# Patient Record
Sex: Male | Born: 1945
Health system: Southern US, Community
[De-identification: ages and names within clinical notes are randomized; demographics above are authoritative.]

## PROBLEM LIST (undated history)

## (undated) DIAGNOSIS — E7800 Pure hypercholesterolemia, unspecified: Secondary | ICD-10-CM

## (undated) DIAGNOSIS — H269 Unspecified cataract: Secondary | ICD-10-CM

## (undated) DIAGNOSIS — I10 Essential (primary) hypertension: Secondary | ICD-10-CM

## (undated) DIAGNOSIS — E119 Type 2 diabetes mellitus without complications: Secondary | ICD-10-CM

## (undated) DIAGNOSIS — K5120 Ulcerative (chronic) proctitis without complications: Secondary | ICD-10-CM

## (undated) DIAGNOSIS — E1140 Type 2 diabetes mellitus with diabetic neuropathy, unspecified: Secondary | ICD-10-CM

## (undated) DIAGNOSIS — C4491 Basal cell carcinoma of skin, unspecified: Secondary | ICD-10-CM

## (undated) DIAGNOSIS — G629 Polyneuropathy, unspecified: Secondary | ICD-10-CM

## (undated) DIAGNOSIS — M1990 Unspecified osteoarthritis, unspecified site: Secondary | ICD-10-CM

## (undated) DIAGNOSIS — S86019A Strain of unspecified Achilles tendon, initial encounter: Secondary | ICD-10-CM

## (undated) DIAGNOSIS — E785 Hyperlipidemia, unspecified: Secondary | ICD-10-CM

## (undated) HISTORY — DX: Hyperlipidemia, unspecified: E78.5

## (undated) HISTORY — DX: Unspecified cataract: H26.9

## (undated) HISTORY — DX: Essential (primary) hypertension: I10

## (undated) HISTORY — DX: Basal cell carcinoma of skin, unspecified: C44.91

## (undated) HISTORY — DX: Strain of unspecified achilles tendon, initial encounter: S86.019A

## (undated) HISTORY — DX: Polyneuropathy, unspecified: G62.9

## (undated) HISTORY — DX: Type 2 diabetes mellitus without complications: E11.9

## (undated) HISTORY — DX: Pure hypercholesterolemia, unspecified: E78.00

## (undated) HISTORY — PX: OTHER SURGICAL HISTORY: SHX169

## (undated) HISTORY — DX: Ulcerative (chronic) proctitis without complications: K51.20

## (undated) HISTORY — PX: TONSILLECTOMY: SUR1361

---

## 2014-11-04 ENCOUNTER — Ambulatory Visit (INDEPENDENT_AMBULATORY_CARE_PROVIDER_SITE_OTHER): Payer: Medicare Other | Admitting: Diagnostic Neuroimaging

## 2014-11-04 ENCOUNTER — Encounter: Payer: Self-pay | Admitting: Diagnostic Neuroimaging

## 2014-11-04 VITALS — BP 139/76 | HR 68 | Ht 72.0 in | Wt 188.8 lb

## 2014-11-04 DIAGNOSIS — M5416 Radiculopathy, lumbar region: Secondary | ICD-10-CM | POA: Diagnosis not present

## 2014-11-04 DIAGNOSIS — E1142 Type 2 diabetes mellitus with diabetic polyneuropathy: Secondary | ICD-10-CM | POA: Diagnosis not present

## 2014-11-04 NOTE — Progress Notes (Signed)
[facility]  PATIENT: Shannon Hodge DOB: 1945-11-17  REFERRING CLINICIAN: Loraine Leriche HISTORY FROM: patient  REASON FOR VISIT: new consult    HISTORICAL  CHIEF COMPLAINT:  Chief Complaint  Patient presents with  . New Evaluation    diabetic neruopathy    HISTORY OF PRESENT ILLNESS:   69 year old right-handed male here for evaluation of low back pain, leg cramps, neuropathy. Patient reports some symptoms starting around age 74-41 years old, but more significant symptoms in the last 6-12 months.  In his 53s, patient developed low back pain, rating left side, left leg, with mild left foot drop. Patient was diagnosed with some degenerative lumbar spine disease, possible left L5 radiculopathy, treated conservatively. Patient had lingering symptoms throughout his life. Occasionally he would have cramping, pain in his left leg.  In 2000, patient diagnosed diabetes. Over past 1-2 years he has noticed some numbness and tingling in his toes and feet, mainly the bottom. He feels a thick, numb, dead sensation in his feet. No significant electrical, stinging, pins and needles pain in his feet. Patient started on gabapentin by PCP which has mildly helped his numbness symptoms.  Now patient having more problems with cramping in his left leg, hamstring region, calf, intermittently, mainly when he lays down or sits down for a long time. Moving, standing, stretching seems to help. He is fairly active at the gym several times per week, using upper body strength machines, weightlifting, treadmill walking.   REVIEW OF SYSTEMS: Full 14 system review of systems performed and notable only for cramps.  ALLERGIES: Allergies not on file  HOME MEDICATIONS: No outpatient prescriptions prior to visit.   No facility-administered medications prior to visit.    PAST MEDICAL HISTORY: Past Medical History  Diagnosis Date  . Diabetes   . Hyperlipidemia   . Hypertension   .  Hypercholesteremia     PAST SURGICAL HISTORY: No past surgical history on file.  FAMILY HISTORY: Family History  Problem Relation Age of Onset  . Pancreatic cancer Mother   . Stroke Father   . Brain cancer Father   . Diabetes Father     SOCIAL HISTORY:  History   Social History  . Marital Status: Married    Spouse Name: Diane   . Number of Children: 3  . Years of Education: post grad   Occupational History  . retired     Social History Main Topics  . Smoking status: Never Smoker   . Smokeless tobacco: Never Used  . Alcohol Use: 0.0 oz/week    0 Standard drinks or equivalent per week     Comment: occasionally   . Drug Use: No  . Sexual Activity: Not on file   Other Topics Concern  . Not on file   Social History Narrative   Lives with wife    Drinks 2 cups of coffee a day     PHYSICAL EXAM  Filed Vitals:   11/04/14 0957  BP: 139/76  Pulse: 68  Height: 6' (1.829 m)  Weight: 188 lb 12.8 oz (85.639 kg)    Body mass index is 25.6 kg/(m^2).   Visual Acuity Screening   Right eye Left eye Both eyes  Without correction: 20/70 20/30   With correction:       No flowsheet data found.  GENERAL EXAM: Patient is in no distress; well developed, nourished and groomed; neck is supple; STRAIGHT LEG RAISE NEG  CARDIOVASCULAR: Regular rate and rhythm, no murmurs, no carotid bruits  NEUROLOGIC:  MENTAL STATUS: awake, alert, oriented to person, place and time, recent and remote memory intact, normal attention and concentration, language fluent, comprehension intact, naming intact, fund of knowledge appropriate CRANIAL NERVE: no papilledema on fundoscopic exam, pupils equal and reactive to light, visual fields full to confrontation, extraocular muscles intact, no nystagmus, facial sensation and strength symmetric, hearing intact, palate elevates symmetrically, uvula midline, shoulder shrug symmetric, tongue midline. MOTOR: normal bulk and tone, full strength in the  BUE, BLE; EXCEPT SUBTLE WEAKNESS IN LEFT FOOT DORSIFLEXION SENSORY: normal and symmetric to light touch, pinprick, temperature, vibration and proprioception; EXCEPT DECR PP IN BOTH FEET, RIGHT TOE VIB 6 SEC, LEFT TOE VIB 3 SEC; DECR IN BOTH FEET (LEFT WORSE THAN RIGHT TO LT, PP AND TEMP) COORDINATION: finger-nose-finger --> MILD INTENTION TREMOR REFLEXES: BUE 1, KNEES 1, RIGHT ANKLE TRACE, LEFT ANKLE 0 GAIT/STATION: narrow based gait; able to walk on toes; LEFT FOOT DROP WEAKNESS ON HEEL WALKING; romberg is negative    DIAGNOSTIC DATA (LABS, IMAGING, TESTING) - I reviewed patient records, labs, notes, testing and imaging myself where available.  No results found for: WBC, HGB, HCT, MCV, PLT No results found for: NA, K, CL, CO2, GLUCOSE, BUN, CREATININE, CALCIUM, PROT, ALBUMIN, AST, ALT, ALKPHOS, BILITOT, GFRNONAA, GFRAA No results found for: CHOL, HDL, LDLCALC, LDLDIRECT, TRIG, CHOLHDL No results found for: HGBA1C No results found for: VITAMINB12 No results found for: TSH     ASSESSMENT AND PLAN  69 y.o. year old male here with 30+ years of low back pain, radiating to left leg, with intermittent left leg cramps / pain.   Dx: left lumbar radiculopathy (L5) + diabetic neuropathy + muscle cramps (due to neuropathy and radiculopathy)  PLAN:  Orders Placed This Encounter  Procedures  . Ambulatory referral to Physical Therapy   Return in about 3 months (around 02/03/2015).  I spent 45 minutes of face to face time with patient. Greater than 50% of time was spent in counseling and coordination of care with patient. We discussed physical therapy, fitness, stretching, conditioning, injury prevention, treatment options (conservative vs invasive). I suggested patient try conservative therapy with physical therapy management. If this fails we may increase gabapentin, consider MRI of the lumbar spine, and consider referral to pain management.   Penni Bombard, MD 03/05/6313, 97:02  AM Certified in Neurology, Neurophysiology and Neuroimaging  [facility] [address] [place], [place] [address] [phone]

## 2014-11-04 NOTE — Patient Instructions (Signed)
Increase gabapentin to 600 mg twice a day

## 2014-11-05 ENCOUNTER — Ambulatory Visit: Payer: Medicare Other | Attending: Diagnostic Neuroimaging

## 2014-11-05 DIAGNOSIS — I10 Essential (primary) hypertension: Secondary | ICD-10-CM | POA: Insufficient documentation

## 2014-11-05 DIAGNOSIS — E1142 Type 2 diabetes mellitus with diabetic polyneuropathy: Secondary | ICD-10-CM | POA: Diagnosis not present

## 2014-11-05 DIAGNOSIS — E785 Hyperlipidemia, unspecified: Secondary | ICD-10-CM | POA: Insufficient documentation

## 2014-11-05 DIAGNOSIS — M5416 Radiculopathy, lumbar region: Secondary | ICD-10-CM | POA: Diagnosis present

## 2014-11-05 DIAGNOSIS — M5442 Lumbago with sciatica, left side: Secondary | ICD-10-CM

## 2014-11-05 DIAGNOSIS — M25659 Stiffness of unspecified hip, not elsewhere classified: Secondary | ICD-10-CM

## 2014-11-05 NOTE — Therapy (Addendum)
[facility] [address], [place], [place], [address] Phone: [phone]   Fax:  [phone]  Physical Therapy Evaluation  Patient Details  Name: Shannon Hodge MRN: [id number] Date of Birth: 1945-09-19 Referring Provider:  Penni Bombard, MD  Encounter Date: 11/05/2014      PT End of Session - 11/05/14 5003    Visit Number 1   Number of Visits 10  medicare   Date for PT Re-Evaluation 12/31/14   PT Start Time 0844   PT Stop Time 0922   PT Time Calculation (min) 38 min   Activity Tolerance Patient tolerated treatment well   Behavior During Therapy [facility] for tasks assessed/performed      Past Medical History  Diagnosis Date  . Diabetes   . Hyperlipidemia   . Hypertension   . Hypercholesteremia     History reviewed. No pertinent past surgical history.  There were no vitals filed for this visit.  Visit Diagnosis:  Hip stiffness, unspecified laterality - Plan: PT plan of care cert/re-cert  Right-sided low back pain with left-sided sciatica - Plan: PT plan of care cert/re-cert      Subjective Assessment - 11/05/14 0846    Subjective Pt is a 69 y.o. male who presents to PT with complaints LBP of LE "cramping"  Lt>Rt especially at night when supine.  This is a chronic problem.   Pertinent History Pt reports Lt foot drop/weakness due to bulging disc 30 years ago.   Diagnostic tests no recent   Patient Stated Goals Reduce leg cramping especially at night, sleep without interruption   Currently in Pain? Yes   Pain Score 5   first thing in the morning   Pain Location Back   Pain Orientation Right;Left   Pain Descriptors / Indicators Sore   Pain Radiating Towards both legs at night- cramping.  Intense cramping Lt>Rt LE   Pain Onset More than a month ago   Pain Frequency Intermittent   Aggravating Factors  sitting/standing long periods (low back pain), sleeping- legs   Pain Relieving Factors avoiding supine,  LBP reduces as day progresses (worse in the morning).  Cramping is relieved by lumbar sidebending   Multiple Pain Sites No            [facility] PT Assessment - 11/05/14 0001    Assessment   Medical Diagnosis Lumbar radiculopathy (M54.16), diabetic polyneuropathy associated with DM (E11.42)   Onset Date 08/01/94   Next MD Visit 01/2015   Prior Therapy none   Precautions   Precautions None   Restrictions   Weight Bearing Restrictions No   Balance Screen   Has the patient fallen in the past 6 months No   Has the patient had a decrease in activity level because of a fear of falling?  No   Is the patient reluctant to leave their home because of a fear of falling?  No   Home Environment   Living Enviornment Private residence   Prior Function   Level of Independence Independent with basic ADLs   Vocation Retired   Biomedical scientist NA   Leisure Pt exercises at [place] 2-3x/wk-weights for upper body and core stabilization   Cognition   Overall Cognitive Status Within Functional Limits for tasks assessed   Observation/Other Assessments   Focus on Therapeutic Outcomes (FOTO)  34% limitation   Posture/Postural Control   Posture/Postural Control Postural limitations   Postural Limitations Decreased lumbar lordosis   ROM / Strength  AROM / PROM / Strength AROM;PROM;Strength   AROM   Overall AROM  Deficits   Overall AROM Comments Lumbar AROM is limited by 25% in all directions with reduced segmental mobility wiht all motions.     AROM Assessment Site Lumbar   PROM   Overall PROM  Deficits   Overall PROM Comments Hip flexibility is limited by 25-50% limitation in all directions   PROM Assessment Site Hip   Strength   Overall Strength Deficits   Overall Strength Comments Lt LE 4+/5, Rt LE 5/5   Strength Assessment Site Hip;Knee   Palpation   Palpation Pt with PA mobility limited by 75% in the lumbar and thoracic spine.  Tension and tenderness noted in bilateral lumbar  parapinals and quadratus   Ambulation/Gait   Ambulation/Gait Yes   Ambulation/Gait Assistance 7: Independent                   [facility] Adult PT Treatment/Exercise - 11/05/14 0001    Exercises   Exercises Lumbar;Knee/Hip   Lumbar Exercises: Stretches   Active Hamstring Stretch 3 reps;20 seconds   Single Knee to Chest Stretch 3 reps;20 seconds   Double Knee to Chest Stretch 3 reps;20 seconds                PT Education - 11/05/14 0917    Education provided Yes   Education Details HEP: lumbar flexibility, seated hamstring stretch   Person(s) Educated Patient   Methods Explanation;Handout;Demonstration   Comprehension Verbalized understanding;Returned demonstration          PT Short Term Goals - 11/05/14 0925    PT SHORT TERM GOAL #1   Title be independent in initial HEP   Time 4   Period Weeks   Status New   PT SHORT TERM GOAL #2   Title report a 25% reduction in leg cramping with sleep at night   Time 4   Period Weeks   Status New   PT SHORT TERM GOAL #3   Title report a 25% reduction in lumbar stiffness in the morning hours   Time 4   Period Weeks   Status New           PT Long Term Goals - 11/05/14 2423    PT LONG TERM GOAL #1   Title be independent in advanced HEP and gym program   Time 8   Period Weeks   Status New   PT LONG TERM GOAL #2   Title reduce FOTO to < or = to 32% limitation   Time 8   Period Weeks   Status New   PT LONG TERM GOAL #3   Title report a 60% reduction in lumbar stiffness in the morning hours   Time 8   Period Weeks   Status New   PT LONG TERM GOAL #4   Title report a 50% reduction in LE cramping at night with sleep   Time 8   Period Weeks   Status New               Plan - 11/05/14 5361    Clinical Impression Statement Pt presents to PT with chronic lumbar pain and LE cramping at night.  Pt with limited lumbar and hip flexibility throughout.  Pt would benefit from a program for flexibility and  core stability.     Pt will benefit from skilled therapeutic intervention in order to improve on the following deficits Impaired flexibility;Improper body mechanics;Pain;Decreased strength  Rehab Potential Good   PT Frequency 2x / week   PT Duration 8 weeks   PT Treatment/Interventions ADLs/Self Care Home Management;Moist Heat;Therapeutic activities;Patient/family education;Therapeutic exercise;Passive range of motion;Ultrasound;Manual techniques;Neuromuscular re-education;Electrical Stimulation;Traction   PT Next Visit Plan Body mechanics education.  Discuss gym exercises for LE strength and endurance.  Hip and lumbar flexibility, core stability   Consulted and Agree with Plan of Care Patient          G-Codes - Nov 30, 2014 0842    Functional Assessment Tool Used FOTO: 34% limitation   Functional Limitation Other PT primary   Other PT Primary Current Status (X7741) At least 20 percent but less than 40 percent impaired, limited or restricted   Other PT Primary Goal Status (S2395) At least 20 percent but less than 40 percent impaired, limited or restricted       Problem List Patient Active Problem List   Diagnosis Date Noted  . Lumbar radiculopathy 11/04/2014  . Diabetic polyneuropathy associated with type 2 diabetes mellitus 11/04/2014    Rossi Silvestro, PT Nov 30, 2014, 9:34 AM  [facility] [address] [place], [place], [address] Phone: [phone]   Fax:  [phone]

## 2014-11-05 NOTE — Patient Instructions (Signed)
Perform all exercises below:  Hold _20___ seconds. Repeat _3___ times.  Do __3__ sessions per day. CAUTION: Movement should be gentle, steady and slow.  Knee to Chest  Lying supine, bend involved knee to chest. Perform with each leg.  Copyright  VHI. All rights reserved.  Double Knee to Chest (Flexion)   Gently pull both knees toward chest. Feel stretch in lower back or buttock area. Breathing deeply, Lumbar Rotation: Caudal - Bilateral (Supine)  Feet and knees together, arms outstretched, rotate knees left, turning head in opposite direction, until stretch is felt.      HIP: Hamstrings - Short Sitting   Rest leg on raised surface. Keep knee straight. Lift chest. Hold _20__ seconds. 3___ reps per set, _3__ sets per day, _7__ days per week  Copyright  VHI. All rights reserved.

## 2014-11-07 ENCOUNTER — Encounter: Payer: Self-pay | Admitting: Physical Therapy

## 2014-11-07 ENCOUNTER — Ambulatory Visit: Payer: Medicare Other | Admitting: Physical Therapy

## 2014-11-07 DIAGNOSIS — M25659 Stiffness of unspecified hip, not elsewhere classified: Secondary | ICD-10-CM

## 2014-11-07 DIAGNOSIS — M5442 Lumbago with sciatica, left side: Secondary | ICD-10-CM

## 2014-11-07 DIAGNOSIS — M5416 Radiculopathy, lumbar region: Secondary | ICD-10-CM | POA: Diagnosis not present

## 2014-11-07 NOTE — Therapy (Signed)
[facility] [address] [place], [place], [address] Phone: [phone]   Fax:  [phone]  Physical Therapy Treatment  Patient Details  Name: Shannon Hodge MRN: [id number] Date of Birth: [birth year] Referring Provider:  Stephens Shire, MD  Encounter Date: 11/07/2014      PT End of Session - 11/07/14 0941    Visit Number 2   Number of Visits 10   Date for PT Re-Evaluation 12/31/14   PT Start Time 0850   PT Stop Time 0955   PT Time Calculation (min) 65 min   Activity Tolerance Patient tolerated treatment well   Behavior During Therapy [facility] for tasks assessed/performed      Past Medical History  Diagnosis Date  . Diabetes   . Hyperlipidemia   . Hypertension   . Hypercholesteremia     History reviewed. No pertinent past surgical history.  There were no vitals filed for this visit.  Visit Diagnosis:  Hip stiffness, unspecified laterality  Right-sided low back pain with left-sided sciatica      Subjective Assessment - 11/07/14 0850    Subjective Stretches helping with morning stiffness.   Currently in Pain? Yes   Pain Score 3    Pain Location Back   Pain Orientation Right;Left   Pain Descriptors / Indicators Sore   Multiple Pain Sites No                       [facility] Adult PT Treatment/Exercise - 11/07/14 0001    Lumbar Exercises: Aerobic   Stationary Bike L2 x 8 min as pt described his current exercise regimine.                PT Education - 11/07/14 0934    Education provided Yes   Education Details HEP for core: isolations and with leg movements. ADL posture and body mechanics.   Person(s) Educated Patient   Methods Explanation;Demonstration;Tactile cues;Verbal cues;Handout   Comprehension Verbalized understanding;Returned demonstration;Tactile cues required          PT Short Term Goals - 11/05/14 0925    PT SHORT TERM GOAL #1   Title be independent in initial HEP    Time 4   Period Weeks   Status New   PT SHORT TERM GOAL #2   Title report a 25% reduction in leg cramping with sleep at night   Time 4   Period Weeks   Status New   PT SHORT TERM GOAL #3   Title report a 25% reduction in lumbar stiffness in the morning hours   Time 4   Period Weeks   Status New           PT Long Term Goals - 11/05/14 5320    PT LONG TERM GOAL #1   Title be independent in advanced HEP and gym program   Time 8   Period Weeks   Status New   PT LONG TERM GOAL #2   Title reduce FOTO to < or = to 32% limitation   Time 8   Period Weeks   Status New   PT LONG TERM GOAL #3   Title report a 60% reduction in lumbar stiffness in the morning hours   Time 8   Period Weeks   Status New   PT LONG TERM GOAL #4   Title report a 50% reduction in LE cramping at night with sleep   Time 8   Period Weeks  Status New               Plan - 11/07/14 0955    Clinical Impression Statement Patient educated in core and body mechanics.   Pt will benefit from skilled therapeutic intervention in order to improve on the following deficits Impaired flexibility;Improper body mechanics;Pain;Decreased strength   Rehab Potential Good   PT Frequency 2x / week   PT Duration 8 weeks   PT Treatment/Interventions ADLs/Self Care Home Management;Moist Heat;Therapeutic activities;Patient/family education;Therapeutic exercise;Passive range of motion;Ultrasound;Manual techniques;Neuromuscular re-education;Electrical Stimulation;Traction   PT Next Visit Plan Review core contraction and stretches, add quadruped stabilization to program.   Consulted and Agree with Plan of Care Patient        Problem List Patient Active Problem List   Diagnosis Date Noted  . Lumbar radiculopathy 11/04/2014  . Diabetic polyneuropathy associated with type 2 diabetes mellitus 11/04/2014    [name], PTA 11/07/2014, 10:03 AM  [facility] [address] [place], [place], [address] Phone: [phone]   Fax:  [phone]

## 2014-11-07 NOTE — Patient Instructions (Signed)
Lower abdominal/core stability exercises  1. Practice your breathing technique: Inhale through your nose expanding your belly and rib cage. Try not to breathe into your chest. Exhale slowly and gradually out your mouth feeling a sense of softness to your body. Practice multiple times. This can be performed unlimited.  2. Finding the lower abdominals. Laying on your back with the knees bent, place your fingers just below your belly button. Using your breathing technique from above, on your exhale gently pull the belly button away from your fingertips without tensing any other muscles. Practice this 5x. Next, as you exhale, draw belly button inwards and hold onto it...then feel as if you are pulling that muscle across your pelvis like you are tightening a belt. This can be hard to do at first so be patient and practice. Do 5-10 reps 1-3 x day. Always recognize quality over quantity; if your abdominal muscles become tired you will notice you may tighten/contract other muscles. This is the time to take a break.   Practice this first laying on your back, then in sitting, progressing to standing and finally adding it to all your daily movements.   3. Finding your pelvic floor. Using the breathing technique above, when your exhale, this time draw your pelvic floor muscles up as if you were attempting to stop the flow of urination. Be careful NOT to tense any other muscles. This can be hard, BE PATIENT. Try to hold up to 10 seconds repeating 10x. Try 2x a day. Once you feel you are doing this well, add this contraction to exercise #2. First contracting your pelvic floor followed by lower abdominals.  4. Adding leg movements. Add the following leg movements to challenge your ability to keep your core stable:  1. Single leg drop outs: Laying on your back with knees bent feet flat. Inhale,  dropping one knee outward KEEPING YOUR PELVIS STILL. Exhale as you bring the leg back, simultaneously performing your lower  abdominal contraction. Do 5-10 on each leg.  2. Marching: While keeping your pelvis still, lift the right foot a few inches, put it down then lift left foot. This will mimic a march. Start slow to establish control. Once you have control you may speed it up. Do 10-20x. You MUST keep your lower abdominlas contracted while you march. Breathe naturally   3. Single leg slides: Inhale while you slowly slide one leg out keeping your pelvis still. Only slide your leg as far as you can keep your pelvis still. Exhale as you bring the leg back to the start, contracting the lower abdominals as you do that. Keep your upper body relaxed. Do 5-10 on each side. Sleeping on Back  Place pillow under knees. A pillow with cervical support and a roll around waist are also helpful. Copyright  VHI. All rights reserved.  Sleeping on Side Place pillow between knees. Use cervical support under neck and a roll around waist as needed. Copyright  VHI. All rights reserved.   Sleeping on Stomach   If this is the only desirable sleeping position, place pillow under lower legs, and under stomach or chest as needed.  Posture - Sitting   Sit upright, head facing forward. Try using a roll to support lower back. Keep shoulders relaxed, and avoid rounded back. Keep hips level with knees. Avoid crossing legs for long periods. Stand to Sit / Sit to Stand   To sit: Bend knees to lower self onto front edge of chair, then scoot back on  seat. To stand: Reverse sequence by placing one foot forward, and scoot to front of seat. Use rocking motion to stand up.   Work Height and Reach  Ideal work height is no more than 2 to 4 inches below elbow level when standing, and at elbow level when sitting. Reaching should be limited to arm's length, with elbows slightly bent.  Bending  Bend at hips and knees, not back. Keep feet shoulder-width apart.    Posture - Standing   Good posture is important. Avoid slouching and forward head  thrust. Maintain curve in low back and align ears over shoul- ders, hips over ankles.  Alternating Positions   Alternate tasks and change positions frequently to reduce fatigue and muscle tension. Take rest breaks. Computer Work   Position work to Programmer, multimedia. Use proper work and seat height. Keep shoulders back and down, wrists straight, and elbows at right angles. Use chair that provides full back support. Add footrest and lumbar roll as needed.  Getting Into / Out of Car  Lower self onto seat, scoot back, then bring in one leg at a time. Reverse sequence to get out.  Dressing  Lie on back to pull socks or slacks over feet, or sit and bend leg while keeping back straight.    Housework - Sink  Place one foot on ledge of cabinet under sink when standing at sink for prolonged periods.   Pushing / Pulling  Pushing is preferable to pulling. Keep back in proper alignment, and use leg muscles to do the work.  Deep Squat   Squat and lift with both arms held against upper trunk. Tighten stomach muscles without holding breath. Use smooth movements to avoid jerking.  Avoid Twisting   Avoid twisting or bending back. Pivot around using foot movements, and bend at knees if needed when reaching for articles.  Carrying Luggage   Distribute weight evenly on both sides. Use a cart whenever possible. Do not twist trunk. Move body as a unit.   Lifting Principles .Maintain proper posture and head alignment. .Slide object as close as possible before lifting. .Move obstacles out of the way. .Test before lifting; ask for help if too heavy. .Tighten stomach muscles without holding breath. .Use smooth movements; do not jerk. .Use legs to do the work, and pivot with feet. .Distribute the work load symmetrically and close to the center of trunk. .Push instead of pull whenever possible.   Ask For Help   Ask for help and delegate to others when possible. Coordinate your movements when  lifting together, and maintain the low back curve.  Log Roll   Lying on back, bend left knee and place left arm across chest. Roll all in one movement to the right. Reverse to roll to the left. Always move as one unit. Housework - Sweeping  Use long-handled equipment to avoid stooping.   Housework - Wiping  Position yourself as close as possible to reach work surface. Avoid straining your back.  Laundry - Unloading Wash   To unload small items at bottom of washer, lift leg opposite to arm being used to reach.  [place] close to area to be raked. Use arm movements to do the work. Keep back straight and avoid twisting.     Cart  When reaching into cart with one arm, lift opposite leg to keep back straight.   Getting Into / Out of Bed  Lower self to lie down on one side by raising legs  and lowering head at the same time. Use arms to assist moving without twisting. Bend both knees to roll onto back if desired. To sit up, start from lying on side, and use same move-ments in reverse. Housework - Vacuuming  Hold the vacuum with arm held at side. Step back and forth to move it, keeping head up. Avoid twisting.   Laundry - IT consultant so that bending and twisting can be avoided.   Laundry - Unloading Dryer  Squat down to reach into clothes dryer or use a reacher.  Gardening - Weeding / Probation officer or Kneel. Knee pads may be helpful.

## 2014-11-10 ENCOUNTER — Ambulatory Visit: Payer: Self-pay | Admitting: Physical Therapy

## 2014-11-12 ENCOUNTER — Ambulatory Visit: Payer: Medicare Other

## 2014-11-12 DIAGNOSIS — M5442 Lumbago with sciatica, left side: Secondary | ICD-10-CM

## 2014-11-12 DIAGNOSIS — M25659 Stiffness of unspecified hip, not elsewhere classified: Secondary | ICD-10-CM

## 2014-11-12 DIAGNOSIS — M5416 Radiculopathy, lumbar region: Secondary | ICD-10-CM | POA: Diagnosis not present

## 2014-11-12 NOTE — Therapy (Signed)
[facility] [address] [place], [place], [address] Phone: [phone]   Fax:  [phone]  Physical Therapy Treatment  Patient Details  Name: Shannon Hodge MRN: [id number] Date of Birth: 1945/11/05 Referring Provider:  Penni Bombard, MD  Encounter Date: 11/12/2014      PT End of Session - 11/12/14 0923    Visit Number 3   Number of Visits 10  Medicare   Date for PT Re-Evaluation 12/31/14   PT Start Time 0850   PT Stop Time 0930   PT Time Calculation (min) 40 min   Activity Tolerance Patient tolerated treatment well   Behavior During Therapy [facility] for tasks assessed/performed      Past Medical History  Diagnosis Date  . Diabetes   . Hyperlipidemia   . Hypertension   . Hypercholesteremia     History reviewed. No pertinent past surgical history.  There were no vitals filed for this visit.  Visit Diagnosis:  Hip stiffness, unspecified laterality  Right-sided low back pain with left-sided sciatica      Subjective Assessment - 11/12/14 0854    Subjective Pt has been doing exercises and working out at the gym.   Currently in Pain? Yes   Pain Score 3    Pain Location Back   Pain Orientation Right;Left   Pain Descriptors / Indicators Sore   Pain Type Chronic pain   Pain Onset More than a month ago   Pain Frequency Intermittent   Aggravating Factors  sleeping- legs, sitting and standing long periods   Pain Relieving Factors not laying down                        [facility] Adult PT Treatment/Exercise - 11/12/14 0001    Lumbar Exercises: Stretches   Active Hamstring Stretch 3 reps;20 seconds  performed in standing on step   Single Knee to Chest Stretch 3 reps;20 seconds   Double Knee to Chest Stretch 3 reps;20 seconds   Lumbar Exercises: Aerobic   Stationary Bike L2 x 8 min as pt described his current exercise regimine.  PT present to discuss progress   Lumbar Exercises: Supine   Ab Set Other (comment)  review of core stabilization exercises issued last session   AB Set Limitations added leg motions to HEP- max verbal cueing required                  PT Short Term Goals - 11/12/14 0855    PT SHORT TERM GOAL #1   Title be independent in initial HEP   Time 4   Period Weeks   Status On-going  Pt is independent in current HEP   PT SHORT TERM GOAL #2   Title report a 25% reduction in leg cramping with sleep at night   Time 4   Period Weeks   Status On-going  Pt denies any cramps in the past few days   PT SHORT TERM GOAL #3   Title report a 25% reduction in lumbar stiffness in the morning hours   Time 4   Period Weeks   Status On-going  Pt reports less stiffness in the morning but not able to rate           PT Long Term Goals - 11/05/14 6599    PT LONG TERM GOAL #1   Title be independent in advanced HEP and gym program   Time 8   Period Weeks  Status New   PT LONG TERM GOAL #2   Title reduce FOTO to < or = to 32% limitation   Time 8   Period Weeks   Status New   PT LONG TERM GOAL #3   Title report a 60% reduction in lumbar stiffness in the morning hours   Time 8   Period Weeks   Status New   PT LONG TERM GOAL #4   Title report a 50% reduction in LE cramping at night with sleep   Time 8   Period Weeks   Status New               Plan - 11/12/14 0906    Clinical Impression Statement Pt with compliance with HEP.  See STGs for status.  Pt requires max verbal cues to not bounce with stretching and to not force stretches.   Pt will benefit from skilled therapeutic intervention in order to improve on the following deficits Impaired flexibility;Improper body mechanics;Pain;Decreased strength   Rehab Potential Good   PT Frequency 2x / week   PT Duration 8 weeks   PT Treatment/Interventions ADLs/Self Care Home Management;Moist Heat;Therapeutic activities;Patient/family education;Therapeutic exercise;Passive range of  motion;Ultrasound;Manual techniques;Neuromuscular re-education;Electrical Stimulation;Traction   PT Next Visit Plan Continue core stabilization, add quadruped exercises to HEP   Consulted and Agree with Plan of Care Patient        Problem List Patient Active Problem List   Diagnosis Date Noted  . Lumbar radiculopathy 11/04/2014  . Diabetic polyneuropathy associated with type 2 diabetes mellitus 11/04/2014    Satrina Magallanes, PT 11/12/2014, 9:30 AM  [facility] [address] [place], [place], [address] Phone: [phone]   Fax:  [phone]

## 2014-11-14 ENCOUNTER — Ambulatory Visit: Payer: Medicare Other | Admitting: Physical Therapy

## 2014-11-14 ENCOUNTER — Encounter: Payer: Self-pay | Admitting: Physical Therapy

## 2014-11-14 DIAGNOSIS — M5416 Radiculopathy, lumbar region: Secondary | ICD-10-CM | POA: Diagnosis not present

## 2014-11-14 DIAGNOSIS — M5442 Lumbago with sciatica, left side: Secondary | ICD-10-CM

## 2014-11-14 NOTE — Patient Instructions (Signed)
Isometric Hold (Quadruped)   On hands and knees, slowly inhale, and then exhale. Pull navel toward spine and Hold for _5__ seconds. Continue to breathe in and out during hold. Rest for 3___ seconds. Repeat __6_ times. Do __1-2_ times a day.   Copyright  VHI. All rights reserved.  Bracing With Arm Raise (Quadruped)   On hands and knees find neutral spine. Tighten pelvic floor and abdominals and hold. Alternately lift arm to shoulder level. Repeat _3__ times. Do _1-3__ times a day.   Copyright  VHI. All rights reserved.  Bracing With Leg Raise (Quadruped)   On hands and knees find neutral spine. Tighten pelvic floor and abdominals and hold. Alternating legs, straighten and lift to hip level. Repeat _3__ times. Do1-3 ___ times a day.   Copyright  VHI. All rights reserved.  Bracing With Arm / Leg Raise (Quadruped)   On hands and knees find neutral spine. Tighten pelvic floor and abdominals and hold. Alternating, lift arm to shoulder level and opposite leg to hip level. Repeat 3___ times. Do _1-3__ times a day.   Copyright  VHI. All rights reserved.  Abduction Lift   Lie on right  side. Tighten muscles on outside of hip to left leg _6-12___ inches. Hold _1___ seconds. Repeat _10___ times. Do __1-3__ sessions per day. Make sure abs are lifting up and in, leg is straight and you are not rolled back in your pelvis. Keep hips stacked.  Copyright  VHI. All rights reserved.

## 2014-11-14 NOTE — Therapy (Signed)
[facility] [address] [place], [place], [address] Phone: [phone]   Fax:  [phone]  Physical Therapy Treatment  Patient Details  Name: Shannon Hodge MRN: [id number] Date of Birth: 1945-10-31 Referring Provider:  Penni Bombard, MD  Encounter Date: 11/14/2014      PT End of Session - 11/14/14 0934    Visit Number 4   Number of Visits 10   Date for PT Re-Evaluation 12/31/14   PT Start Time 1761   PT Stop Time 0934   PT Time Calculation (min) 47 min   Activity Tolerance Patient tolerated treatment well   Behavior During Therapy [facility] for tasks assessed/performed      Past Medical History  Diagnosis Date  . Diabetes   . Hyperlipidemia   . Hypertension   . Hypercholesteremia     History reviewed. No pertinent past surgical history.  There were no vitals filed for this visit.  Visit Diagnosis:  Right-sided low back pain with left-sided sciatica      Subjective Assessment - 11/14/14 0851    Subjective Too soon to tell any major differences. Doing all exercises.    Currently in Pain? No/denies  Morning stiffness                       [facility] Adult PT Treatment/Exercise - 11/14/14 0001    Lumbar Exercises: Aerobic   Stationary Bike L2x 10 min   UBE (Upper Arm Bike) Sitting on ball L2 3x3   Lumbar Exercises: Supine   Ab Set 5 reps   AB Set Limitations MIn VC to start i neutral spine   Lumbar Exercises: Sidelying   Hip Abduction 10 reps   Hip Abduction Limitations Tactile cuing for pelvic position   Lumbar Exercises: Quadruped   Single Arm Raise Right;Left;1 second   Single Arm Raises Limitations 3x   Straight Leg Raise 2 seconds   Straight Leg Raises Limitations 3x bil   Opposite Arm/Leg Raise Limitations 2x  bil                PT Education - 11/14/14 0920    Education provided Yes   Education Details HEP advancement: quadruped series and sidelying abduction   Person(s) Educated Patient   Methods Explanation;Demonstration;Tactile cues;Verbal cues;Handout   Comprehension Verbalized understanding;Returned demonstration          PT Short Term Goals - 11/12/14 0855    PT SHORT TERM GOAL #1   Title be independent in initial HEP   Time 4   Period Weeks   Status On-going  Pt is independent in current HEP   PT SHORT TERM GOAL #2   Title report a 25% reduction in leg cramping with sleep at night   Time 4   Period Weeks   Status On-going  Pt denies any cramps in the past few days   PT SHORT TERM GOAL #3   Title report a 25% reduction in lumbar stiffness in the morning hours   Time 4   Period Weeks   Status On-going  Pt reports less stiffness in the morning but not able to rate           PT Long Term Goals - 11/05/14 0842    PT LONG TERM GOAL #1   Title be independent in advanced HEP and gym program   Time 8   Period Weeks   Status New   PT LONG TERM GOAL #2  Title reduce FOTO to < or = to 32% limitation   Time 8   Period Weeks   Status New   PT LONG TERM GOAL #3   Title report a 60% reduction in lumbar stiffness in the morning hours   Time 8   Period Weeks   Status New   PT LONG TERM GOAL #4   Title report a 50% reduction in LE cramping at night with sleep   Time 8   Period Weeks   Status New               Plan - 11/14/14 0934    Clinical Impression Statement Advanced HEP to include quadruped exercises.    Pt will benefit from skilled therapeutic intervention in order to improve on the following deficits Impaired flexibility;Improper body mechanics;Pain;Decreased strength   Rehab Potential Good   PT Frequency 2x / week   PT Duration 8 weeks   PT Treatment/Interventions ADLs/Self Care Home Management;Moist Heat;Therapeutic activities;Patient/family education;Therapeutic exercise;Passive range of motion;Ultrasound;Manual techniques;Neuromuscular re-education;Electrical Stimulation;Traction   PT Next Visit Plan  Review exercises   Consulted and Agree with Plan of Care Patient        Problem List Patient Active Problem List   Diagnosis Date Noted  . Lumbar radiculopathy 11/04/2014  . Diabetic polyneuropathy associated with type 2 diabetes mellitus 11/04/2014    Machi Whittaker, PTA 11/14/2014, 9:36 AM  [facility] [address], [place], [place], [address] Phone: [phone]   Fax:  [phone]

## 2014-11-19 ENCOUNTER — Ambulatory Visit: Payer: Medicare Other | Admitting: Physical Therapy

## 2014-11-19 ENCOUNTER — Encounter: Payer: Self-pay | Admitting: Physical Therapy

## 2014-11-19 DIAGNOSIS — M5416 Radiculopathy, lumbar region: Secondary | ICD-10-CM | POA: Diagnosis not present

## 2014-11-19 DIAGNOSIS — M25659 Stiffness of unspecified hip, not elsewhere classified: Secondary | ICD-10-CM

## 2014-11-19 DIAGNOSIS — M5442 Lumbago with sciatica, left side: Secondary | ICD-10-CM

## 2014-11-19 NOTE — Patient Instructions (Signed)
Bridge   Lie back, legs bent. Inhale, squeeze ball between knees then lift hips up. Keeping ribs in, lengthen lower back. Exhale, rolling down along spine from top. Repeat _10___ times. Do _1-2___ sessions per day. Pause at the top to engage your core muscles.  Copyright  VHI. All rights reserved.

## 2014-11-19 NOTE — Therapy (Signed)
[facility] [address], [place], [place], [address] Phone: [phone]   Fax:  [phone]  Physical Therapy Treatment  Patient Details  Name: Shannon Hodge MRN: [id number] Date of Birth: 12-02-1945 Referring Provider:  Stephens Shire, MD  Encounter Date: 11/19/2014      PT End of Session - 11/19/14 0918    Visit Number 5   Number of Visits 10   Date for PT Re-Evaluation 12/31/14   PT Start Time 0845   PT Stop Time 0925   PT Time Calculation (min) 40 min   Activity Tolerance Patient tolerated treatment well   Behavior During Therapy [facility] for tasks assessed/performed      Past Medical History  Diagnosis Date  . Diabetes   . Hyperlipidemia   . Hypertension   . Hypercholesteremia     History reviewed. No pertinent past surgical history.  There were no vitals filed for this visit.  Visit Diagnosis:  Right-sided low back pain with left-sided sciatica  Hip stiffness, unspecified laterality      Subjective Assessment - 11/19/14 0845    Subjective I feel residually better after my workouts.    Currently in Pain? No/denies                         [facility] Adult PT Treatment/Exercise - 11/19/14 0001    Lumbar Exercises: Aerobic   Stationary Bike L3 x10 min   UBE (Upper Arm Bike) Sitting on ball L2 4x4   Lumbar Exercises: Standing   Heel Raises 10 reps   Lumbar Exercises: Supine   Bridge 10 reps;2 seconds   Bridge Limitations Used ball   Lumbar Exercises: Sidelying   Hip Abduction 20 reps   Lumbar Exercises: Quadruped   Opposite Arm/Leg Raise Right arm/Left leg;Left arm/Right leg;5 reps;2 seconds                PT Education - 11/19/14 0900    Education provided Yes   Education Details Bridge   Person(s) Educated Patient   Methods Explanation;Demonstration;Tactile cues;Verbal cues;Handout   Comprehension Verbalized understanding;Returned demonstration;Verbal cues  required;Tactile cues required          PT Short Term Goals - 11/19/14 0850    PT SHORT TERM GOAL #1   Title be independent in initial HEP   Time 4   Period Weeks   Status Achieved   PT SHORT TERM GOAL #2   Title report a 25% reduction in leg cramping with sleep at night   Time 4   Period Weeks   Status Achieved  25%   PT SHORT TERM GOAL #3   Title report a 25% reduction in lumbar stiffness in the morning hours   Time 4   Period Weeks   Status --  25%           PT Long Term Goals - 11/05/14 8099    PT LONG TERM GOAL #1   Title be independent in advanced HEP and gym program   Time 8   Period Weeks   Status New   PT LONG TERM GOAL #2   Title reduce FOTO to < or = to 32% limitation   Time 8   Period Weeks   Status New   PT LONG TERM GOAL #3   Title report a 60% reduction in lumbar stiffness in the morning hours   Time 8   Period Weeks   Status New  PT LONG TERM GOAL #4   Title report a 50% reduction in LE cramping at night with sleep   Time 8   Period Weeks   Status New               Plan - 11/19/14 8288    Clinical Impression Statement Patient compliant with HEP, added bridge to advance HEP.    Pt will benefit from skilled therapeutic intervention in order to improve on the following deficits Impaired flexibility;Improper body mechanics;Pain;Decreased strength   Rehab Potential Good   PT Frequency 2x / week   PT Duration 8 weeks   PT Treatment/Interventions ADLs/Self Care Home Management;Moist Heat;Therapeutic activities;Patient/family education;Therapeutic exercise;Passive range of motion;Ultrasound;Manual techniques;Neuromuscular re-education;Electrical Stimulation;Traction   PT Next Visit Plan Review exercises   Consulted and Agree with Plan of Care Patient        Problem List Patient Active Problem List   Diagnosis Date Noted  . Lumbar radiculopathy 11/04/2014  . Diabetic polyneuropathy associated with type 2 diabetes mellitus  11/04/2014    COCHRAN,JENNIFER, PTA 11/19/2014, 9:26 AM  [facility] [address], [place], [place], [address] Phone: [phone]   Fax:  [phone]

## 2014-11-21 ENCOUNTER — Ambulatory Visit: Payer: Medicare Other | Admitting: Physical Therapy

## 2014-11-21 ENCOUNTER — Encounter: Payer: Self-pay | Admitting: Physical Therapy

## 2014-11-21 DIAGNOSIS — M5442 Lumbago with sciatica, left side: Secondary | ICD-10-CM

## 2014-11-21 DIAGNOSIS — M5416 Radiculopathy, lumbar region: Secondary | ICD-10-CM | POA: Diagnosis not present

## 2014-11-21 NOTE — Therapy (Signed)
[facility] [address] [place], [place], [address] Phone: [phone]   Fax:  [phone]  Physical Therapy Treatment  Patient Details  Name: Shannon Hodge MRN: [id number] Date of Birth: 1945/12/07 Referring Provider:  Penni Bombard, MD  Encounter Date: 11/21/2014      PT End of Session - 11/21/14 0911    Visit Number 6   Number of Visits 10   Date for PT Re-Evaluation 12/31/14   PT Start Time 0843   PT Stop Time 0921   PT Time Calculation (min) 38 min   Activity Tolerance Patient tolerated treatment well   Behavior During Therapy [facility] for tasks assessed/performed      Past Medical History  Diagnosis Date  . Diabetes   . Hyperlipidemia   . Hypertension   . Hypercholesteremia     History reviewed. No pertinent past surgical history.  There were no vitals filed for this visit.  Visit Diagnosis:  Right-sided low back pain with left-sided sciatica      Subjective Assessment - 11/21/14 0845    Subjective Did a lot of yard work yesterday with only minor aches. I can get myself going easier in the AM.   Currently in Pain? No/denies            [facility] PT Assessment - 11/21/14 0001    Observation/Other Assessments   Focus on Therapeutic Outcomes (FOTO)  33% limited                     [facility] Adult PT Treatment/Exercise - 11/21/14 0001    Lumbar Exercises: Aerobic   Stationary Bike L4x 10 min   UBE (Upper Arm Bike) Sitting on ball L2 4x4   Lumbar Exercises: Standing   Heel Raises 20 reps   Lumbar Exercises: Supine   Bridge 10 reps   Bridge Limitations Used ball   Lumbar Exercises: Quadruped   Opposite Arm/Leg Raise Limitations 5x bil                  PT Short Term Goals - 11/19/14 0850    PT SHORT TERM GOAL #1   Title be independent in initial HEP   Time 4   Period Weeks   Status Achieved   PT SHORT TERM GOAL #2   Title report a 25% reduction in leg cramping  with sleep at night   Time 4   Period Weeks   Status Achieved  25%   PT SHORT TERM GOAL #3   Title report a 25% reduction in lumbar stiffness in the morning hours   Time 4   Period Weeks   Status --  25%           PT Long Term Goals - 11/21/14 0913    PT LONG TERM GOAL #1   Title be independent in advanced HEP and gym program   Time 8   Period Weeks   Status Achieved   PT LONG TERM GOAL #2   Title reduce FOTO to < or = to 32% limitation   Time 8   Period Weeks   Status Not Met  33%   PT LONG TERM GOAL #3   Title report a 60% reduction in lumbar stiffness in the morning hours   Time 8   Period Weeks   Status Partially Met  25%   PT LONG TERM GOAL #4   Title report a 50% reduction in LE cramping at night with sleep  Time 8   Period Weeks   Status Partially Met  25%               Plan - 21-Dec-2014 0912    Clinical Impression Statement Patient would like to be discharged today.    Pt will benefit from skilled therapeutic intervention in order to improve on the following deficits Impaired flexibility;Improper body mechanics;Pain;Decreased strength   Rehab Potential Good   PT Frequency 2x / week   PT Duration 8 weeks   PT Treatment/Interventions ADLs/Self Care Home Management;Moist Heat;Therapeutic activities;Patient/family education;Therapeutic exercise;Passive range of motion;Ultrasound;Manual techniques;Neuromuscular re-education;Electrical Stimulation;Traction   PT Next Visit Plan Discharge   Consulted and Agree with Plan of Care Patient          G-Codes - 12/21/2014 0939    Functional Assessment Tool Used FOTO score is 33% due to decreased cramping in leg by 25%   Functional Limitation Other PT primary   Other PT Primary Goal Status (N2355) At least 20 percent but less than 40 percent impaired, limited or restricted   Other PT Primary Discharge Status (D3220) At least 20 percent but less than 40 percent impaired, limited or restricted      Problem  List Patient Active Problem List   Diagnosis Date Noted  . Lumbar radiculopathy 11/04/2014  . Diabetic polyneuropathy associated with type 2 diabetes mellitus 11/04/2014  Earlie Counts, PT Dec 21, 2014 10:56 AM  Myrene Galas, PTA Dec 21, 2014 10:56 AM  [facility] [address], [place], [place], [address] Phone: [phone]   Fax:  [phone]  PHYSICAL THERAPY DISCHARGE SUMMARY  Visits from Start of Care: 6  Current functional level related to goals / functional outcome  See above goals for assessment. Remaining deficits: Patient has 25% decreased cramping in leg.   Patient has 25% decreased stiffness in lumbar in the morning.   Education / Equipment: HEP Plan: Patient agrees to discharge.  Patient goals were partially met. Patient is being discharged due to being pleased with the current functional level.  Thank you for the referral. Earlie Counts, PT,PT 2014/12/21 10:55 AM ?????

## 2015-01-30 ENCOUNTER — Telehealth: Payer: Self-pay | Admitting: Diagnostic Neuroimaging

## 2015-01-30 NOTE — Telephone Encounter (Signed)
Dr Leta Baptist,  [facility] for you.  Thank you, Verneita Griffes

## 2015-01-30 NOTE — Telephone Encounter (Signed)
FYI:  Patient wanted Dr. Leta Baptist to be aware that he did do PT and it was helpful for him. He has cancelled his 70-month follow up apt but will call back to resch. No need to return call.

## 2015-02-04 ENCOUNTER — Ambulatory Visit: Payer: Medicare Other | Admitting: Diagnostic Neuroimaging

## 2015-05-20 ENCOUNTER — Encounter: Payer: Self-pay | Admitting: Diagnostic Neuroimaging

## 2015-05-20 ENCOUNTER — Ambulatory Visit (INDEPENDENT_AMBULATORY_CARE_PROVIDER_SITE_OTHER): Payer: Medicare Other | Admitting: Diagnostic Neuroimaging

## 2015-05-20 VITALS — BP 129/72 | HR 64 | Ht 72.0 in | Wt 172.6 lb

## 2015-05-20 DIAGNOSIS — M5416 Radiculopathy, lumbar region: Secondary | ICD-10-CM | POA: Diagnosis not present

## 2015-05-20 DIAGNOSIS — E1142 Type 2 diabetes mellitus with diabetic polyneuropathy: Secondary | ICD-10-CM

## 2015-05-20 NOTE — Patient Instructions (Signed)
Thank you for coming to see [place] at [facility]. I hope we have been able to provide you high quality care today.  You may receive a patient satisfaction survey over the next few weeks. We would appreciate your feedback and comments so that we may continue to improve ourselves and the health of our patients.  - increase gabapentin to 939m at bedtime - continue physical activities  - try magnesium supplements or tonic water for cramps - may consider cymbalta or amitriptyline in future   ~~~~~~~~~~~~~~~~~~~~~~~~~~~~~~~~~~~~~~~~~~~~~~~~~~~~~~~~~~~~~~~~~  DR. Orvill Coulthard'S GUIDE TO HAPPY AND HEALTHY LIVING These are some of my general health and wellness recommendations. Some of them may apply to you better than others. Please use common sense as you try these suggestions and feel free to ask me any questions.   ACTIVITY/FITNESS Mental, social, emotional and physical stimulation are very important for brain and body health. Try learning a new activity (arts, music, language, sports, games).  Keep moving your body to the best of your abilities. You can do this at home, inside or outside, the park, community center, gym or anywhere you like. Consider a physical therapist or personal trainer to get started. Consider the app Sworkit. Fitness trackers such as smart-watches, smart-phones or Fitbits can help as well.   NUTRITION Eat more plants: colorful vegetables, nuts, seeds and berries.  Eat less sugar, salt, preservatives and processed foods.  Avoid toxins such as cigarettes and alcohol.  Drink water when you are thirsty. Warm water with a slice of lemon is an excellent morning drink to start the day.  Consider these websites for more information The Nutrition Source (hhttps://www.henry-hernandez.biz/ Precision Nutrition (wWindowBlog.ch   RELAXATION Consider practicing mindfulness meditation or other relaxation techniques such  as deep breathing, prayer, yoga, tai chi, massage. See website mindful.org or the apps Headspace or Calm to help get started.   SLEEP Try to get at least 7-8+ hours sleep per day. Regular exercise and reduced caffeine will help you sleep better. Practice good sleep hygeine techniques. See website sleep.org for more information.   PLANNING Prepare estate planning, living will, healthcare POA documents. Sometimes this is best planned with the help of an attorney. Theconversationproject.org and agingwithdignity.org are excellent resources.

## 2015-05-20 NOTE — Progress Notes (Signed)
[facility]  PATIENT: Shannon Hodge DOB: Jun 14, 1946  REFERRING CLINICIAN: Loraine Leriche HISTORY FROM: patient REASON FOR VISIT: follow up   HISTORICAL  CHIEF COMPLAINT:  Chief Complaint  Patient presents with  . Lumbar Radiculopathy    rm 7  . Follow-up    6 month    HISTORY OF PRESENT ILLNESS:   UPDATE 05/20/15: Since last visit, tried PT and using gabapentin. Still with cramps, numbness and left hip pain.  PRIOR HPI (11/04/14): 69 year old right-handed male here for evaluation of low back pain, leg cramps, neuropathy. Patient reports some symptoms starting around age 45-38 years old, but more significant symptoms in the last 6-12 months. In his 73s, patient developed low back pain, rating left side, left leg, with mild left foot drop. Patient was diagnosed with some degenerative lumbar spine disease, possible left L5 radiculopathy, treated conservatively. Patient had lingering symptoms throughout his life. Occasionally he would have cramping, pain in his left leg. In 2000, patient diagnosed diabetes. Over past 1-2 years he has noticed some numbness and tingling in his toes and feet, mainly the bottom. He feels a thick, numb, dead sensation in his feet. No significant electrical, stinging, pins and needles pain in his feet. Patient started on gabapentin by PCP which has mildly helped his numbness symptoms. Now patient having more problems with cramping in his left leg, hamstring region, calf, intermittently, mainly when he lays down or sits down for a long time. Moving, standing, stretching seems to help. He is fairly active at the gym several times per week, using upper body strength machines, weightlifting, treadmill walking.   REVIEW OF SYSTEMS: Full 14 system review of systems performed and notable only for cramps cramps back pain muscle cramps freq waking.    ALLERGIES: No Known Allergies  HOME MEDICATIONS: Outpatient Prescriptions Prior to Visit  Medication  Sig Dispense Refill  . aspirin 81 MG tablet Take 81 mg by mouth daily. 1/2 tab daily    . fenofibrate micronized (LOFIBRA) 134 MG capsule   1  . gabapentin (NEURONTIN) 600 MG tablet   5  . lisinopril (PRINIVIL,ZESTRIL) 20 MG tablet   1  . metFORMIN (GLUCOPHAGE-XR) 500 MG 24 hr tablet   1  . simvastatin (ZOCOR) 20 MG tablet   1  . valACYclovir (VALTREX) 500 MG tablet   2  . zolpidem (AMBIEN) 10 MG tablet Take 10 mg by mouth at bedtime as needed for sleep. Pt take small amounts cut off the main pill     No facility-administered medications prior to visit.    PAST MEDICAL HISTORY: Past Medical History  Diagnosis Date  . Diabetes ([place])   . Hyperlipidemia   . Hypertension   . Hypercholesteremia     PAST SURGICAL HISTORY: No past surgical history on file.  FAMILY HISTORY: Family History  Problem Relation Age of Onset  . Pancreatic cancer Mother   . Stroke Father   . Brain cancer Father   . Diabetes Father     SOCIAL HISTORY:  Social History   Social History  . Marital Status: Married    Spouse Name: Diane   . Number of Children: 3  . Years of Education: post grad   Occupational History  . retired     Social History Main Topics  . Smoking status: Never Smoker   . Smokeless tobacco: Never Used  . Alcohol Use: 0.0 oz/week    0 Standard drinks or equivalent per week     Comment: occasionally   .  Drug Use: No  . Sexual Activity: Not on file   Other Topics Concern  . Not on file   Social History Narrative   Lives with wife    Drinks 2 cups of coffee a day     PHYSICAL EXAM  Filed Vitals:   05/20/15 1251  BP: 129/72  Pulse: 64  Height: 6' (1.829 m)  Weight: 172 lb 9.6 oz (78.291 kg)    Body mass index is 23.4 kg/(m^2).  No exam data present  No flowsheet data found.   GENERAL EXAM: Patient is in no distress; well developed, nourished and groomed; neck is supple; STRAIGHT LEG RAISE NEG  CARDIOVASCULAR: Regular rate and rhythm, no murmurs, no  carotid bruits  NEUROLOGIC: MENTAL STATUS: awake, alert, language fluent, comprehension intact, naming intact, fund of knowledge appropriate CRANIAL NERVE: pupils equal and reactive to light, visual fields full to confrontation, extraocular muscles intact, no nystagmus, facial sensation and strength symmetric, hearing intact, palate elevates symmetrically, uvula midline, shoulder shrug symmetric, tongue midline. MOTOR: normal bulk and tone, full strength in the BUE, BLE; EXCEPT SUBTLE WEAKNESS IN LEFT FOOT DORSIFLEXION SENSORY: normal and symmetric to light touch, pinprick, temperature, vibration; EXCEPT DECR PP IN BOTH FEET, RIGHT TOE VIB 6 SEC, LEFT TOE VIB 3 SEC; DECR IN BOTH FEET (LEFT WORSE THAN RIGHT TO LT, PP AND TEMP) COORDINATION: finger-nose-finger --> MILD INTENTION TREMOR REFLEXES: BUE 1, KNEES 1, RIGHT ANKLE TRACE, LEFT ANKLE 0 GAIT/STATION: narrow based gait; able to walk on toes; MILD LEFT FOOT DROP WEAKNESS ON HEEL WALKING; romberg is negative    DIAGNOSTIC DATA (LABS, IMAGING, TESTING) - I reviewed patient records, labs, notes, testing and imaging myself where available.  No results found for: WBC, HGB, HCT, MCV, PLT No results found for: NA, K, CL, CO2, GLUCOSE, BUN, CREATININE, CALCIUM, PROT, ALBUMIN, AST, ALT, ALKPHOS, BILITOT, GFRNONAA, GFRAA No results found for: CHOL, HDL, LDLCALC, LDLDIRECT, TRIG, CHOLHDL No results found for: HGBA1C No results found for: VITAMINB12 No results found for: TSH     ASSESSMENT AND PLAN  69 y.o. year old male here with 30+ years of low back pain, radiating to left leg, with intermittent left leg cramps / pain.   Dx: left lumbar radiculopathy (L5) + diabetic neuropathy + muscle cramps (due to neuropathy and radiculopathy)  PLAN: I spent 25 minutes of face to face time with patient. Greater than 50% of time was spent in counseling and coordination of care with patient. In summary we discussed: - increase gabapentin to 900mg  at  bedtime - continue physical activities - try magnesium supplements or tonic water for cramps - may consider cymbalta or amitriptyline in future  Return in about 6 months (around 11/18/2015).  We discussed physical therapy, fitness, stretching, conditioning, injury prevention, treatment options (conservative vs invasive). I suggested patient try conservative therapy with physical therapy management. If this fails we may increase gabapentin, consider MRI of the lumbar spine, and consider referral to pain management.   Penni Bombard, MD 28/41/3244, 0:10 PM Certified in Neurology, Neurophysiology and Neuroimaging  [facility] [address], [place], [place] [address] [phone]

## 2015-11-23 ENCOUNTER — Encounter: Payer: Self-pay | Admitting: Diagnostic Neuroimaging

## 2015-11-23 ENCOUNTER — Ambulatory Visit (INDEPENDENT_AMBULATORY_CARE_PROVIDER_SITE_OTHER): Payer: Medicare Other | Admitting: Diagnostic Neuroimaging

## 2015-11-23 VITALS — BP 138/73 | HR 55 | Ht 72.0 in | Wt 179.0 lb

## 2015-11-23 DIAGNOSIS — E1142 Type 2 diabetes mellitus with diabetic polyneuropathy: Secondary | ICD-10-CM

## 2015-11-23 DIAGNOSIS — M5416 Radiculopathy, lumbar region: Secondary | ICD-10-CM

## 2015-11-23 NOTE — Patient Instructions (Addendum)
Thank you for coming to see [place] at [facility]. I hope we have been able to provide you high quality care today.  You may receive a patient satisfaction survey over the next few weeks. We would appreciate your feedback and comments so that we may continue to improve ourselves and the health of our patients.  - continue current medications and physical activity   ~~~~~~~~~~~~~~~~~~~~~~~~~~~~~~~~~~~~~~~~~~~~~~~~~~~~~~~~~~~~~~~~~  DR. Tyrie Porzio'S GUIDE TO HAPPY AND HEALTHY LIVING These are some of my general health and wellness recommendations. Some of them may apply to you better than others. Please use common sense as you try these suggestions and feel free to ask me any questions.   ACTIVITY/FITNESS Mental, social, emotional and physical stimulation are very important for brain and body health. Try learning a new activity (arts, music, language, sports, games).  Keep moving your body to the best of your abilities. You can do this at home, inside or outside, the park, community center, gym or anywhere you like. Consider a physical therapist or personal trainer to get started. Consider the app Sworkit. Fitness trackers such as smart-watches, smart-phones or Fitbits can help as well.   NUTRITION Eat more plants: colorful vegetables, nuts, seeds and berries.  Eat less sugar, salt, preservatives and processed foods.  Avoid toxins such as cigarettes and alcohol.  Drink water when you are thirsty. Warm water with a slice of lemon is an excellent morning drink to start the day.  Consider these websites for more information The Nutrition Source (https://www.henry-hernandez.biz/) Precision Nutrition (WindowBlog.ch)   RELAXATION Consider practicing mindfulness meditation or other relaxation techniques such as deep breathing, prayer, yoga, tai chi, massage. See website mindful.org or the apps Headspace or Calm to help get  started.   SLEEP Try to get at least 7-8+ hours sleep per day. Regular exercise and reduced caffeine will help you sleep better. Practice good sleep hygeine techniques. See website sleep.org for more information.   PLANNING Prepare estate planning, living will, healthcare POA documents. Sometimes this is best planned with the help of an attorney. Theconversationproject.org and agingwithdignity.org are excellent resources.

## 2015-11-23 NOTE — Progress Notes (Signed)
[facility]  PATIENT: Shannon Hodge DOB: [birth year]  REFERRING CLINICIAN: Loraine Leriche HISTORY FROM: patient REASON FOR VISIT: follow up   HISTORICAL  CHIEF COMPLAINT:  Chief Complaint  Patient presents with  . Diabetic Neuropathy    rm 6, "neuropathy is always there, no worse; nightime foot cramping, back pain-use heating pad a lot"  . Follow-up    6 month    HISTORY OF PRESENT ILLNESS:   UPDATE 68/24/17: Since last visit, neuropathy sxs are stable. Exercising more and doing well. Nighttime cramps.   UPDATE 68/19/16: Since last visit, tried PT and using gabapentin. Still with cramps, numbness and left hip pain.  PRIOR HPI (11/04/14): 70 year old right-handed male here for evaluation of low back pain, leg cramps, neuropathy. Patient reports some symptoms starting around age 47-73 years old, but more significant symptoms in the last 6-12 months. In his 73s, patient developed low back pain, rating left side, left leg, with mild left foot drop. Patient was diagnosed with some degenerative lumbar spine disease, possible left L5 radiculopathy, treated conservatively. Patient had lingering symptoms throughout his life. Occasionally he would have cramping, pain in his left leg. In 2000, patient diagnosed diabetes. Over past 1-2 years he has noticed some numbness and tingling in his toes and feet, mainly the bottom. He feels a thick, numb, dead sensation in his feet. No significant electrical, stinging, pins and needles pain in his feet. Patient started on gabapentin by PCP which has mildly helped his numbness symptoms. Now patient having more problems with cramping in his left leg, hamstring region, calf, intermittently, mainly when he lays down or sits down for a long time. Moving, standing, stretching seems to help. He is fairly active at the gym several times per week, using upper body strength machines, weightlifting, treadmill walking.   REVIEW OF SYSTEMS: Full 14 system  review of systems performed and negative: except for freq waking.    ALLERGIES: No Known Allergies  HOME MEDICATIONS: Outpatient Prescriptions Prior to Visit  Medication Sig Dispense Refill  . aspirin 81 MG tablet Take 81 mg by mouth daily. 1/2 tab daily    . fenofibrate micronized (LOFIBRA) 134 MG capsule   1  . gabapentin (NEURONTIN) 600 MG tablet   5  . lisinopril (PRINIVIL,ZESTRIL) 20 MG tablet   1  . metFORMIN (GLUCOPHAGE-XR) 500 MG 24 hr tablet   1  . simvastatin (ZOCOR) 20 MG tablet   1  . valACYclovir (VALTREX) 500 MG tablet   2  . zolpidem (AMBIEN) 10 MG tablet Take 10 mg by mouth at bedtime as needed for sleep. Pt take small amounts cut off the main pill     No facility-administered medications prior to visit.    PAST MEDICAL HISTORY: Past Medical History  Diagnosis Date  . Diabetes ([place])   . Hyperlipidemia   . Hypertension   . Hypercholesteremia     PAST SURGICAL HISTORY: History reviewed. No pertinent past surgical history.  FAMILY HISTORY: Family History  Problem Relation Age of Onset  . Pancreatic cancer Mother   . Stroke Father   . Brain cancer Father   . Diabetes Father     SOCIAL HISTORY:  Social History   Social History  . Marital Status: Married    Spouse Name: Diane   . Number of Children: 3  . Years of Education: post grad   Occupational History  . retired     Social History Main Topics  . Smoking status: Never Smoker   .  Smokeless tobacco: Never Used  . Alcohol Use: 0.0 oz/week    0 Standard drinks or equivalent per week     Comment: occasionally   . Drug Use: No  . Sexual Activity: Not on file   Other Topics Concern  . Not on file   Social History Narrative   Lives with wife    Drinks 2 cups of coffee a day     PHYSICAL EXAM  Filed Vitals:   11/23/15 0955  BP: 138/73  Pulse: 55  Height: 6' (1.829 m)  Weight: 179 lb (81.194 kg)    Body mass index is 24.27 kg/(m^2).  No exam data present  No flowsheet data  found.   GENERAL EXAM: Patient is in no distress; well developed, nourished and groomed; neck is supple  CARDIOVASCULAR: Regular rate and rhythm, no murmurs  NEUROLOGIC: MENTAL STATUS: awake, alert, language fluent, comprehension intact, naming intact, fund of knowledge appropriate CRANIAL NERVE: pupils equal and reactive to light, visual fields full to confrontation, extraocular muscles intact, no nystagmus, facial sensation and strength symmetric, hearing intact, palate elevates symmetrically, uvula midline, shoulder shrug symmetric, tongue midline. MOTOR: normal bulk and tone, full strength in the BUE, BLE SENSORY: normal and symmetric to light touch, temperature, vibration COORDINATION: finger-nose-finger --> MILD INTENTION TREMOR REFLEXES: BUE 1, KNEES 1, ANKLES TRACE GAIT/STATION: narrow based gait; able to walk on toes; MILD LEFT FOOT DROP WEAKNESS ON HEEL WALKING; romberg is negative    DIAGNOSTIC DATA (LABS, IMAGING, TESTING) - I reviewed patient records, labs, notes, testing and imaging myself where available.  No results found for: WBC, HGB, HCT, MCV, PLT No results found for: NA, K, CL, CO2, GLUCOSE, BUN, CREATININE, CALCIUM, PROT, ALBUMIN, AST, ALT, ALKPHOS, BILITOT, GFRNONAA, GFRAA No results found for: CHOL, HDL, LDLCALC, LDLDIRECT, TRIG, CHOLHDL No results found for: HGBA1C No results found for: VITAMINB12 No results found for: TSH      ASSESSMENT AND PLAN  70 y.o. year old male here with 30+ years of low back pain, radiating to left leg, with intermittent left leg cramps / pain.   Dx: left lumbar radiculopathy (L5) + diabetic neuropathy + muscle cramps (due to neuropathy and radiculopathy)   PLAN: - continue gabapentin 300/600 - continue physical activities - continue magnesium supplements for cramps - may consider cymbalta or amitriptyline in future  No Follow-up on file.  We discussed physical therapy, fitness, stretching, conditioning, injury  prevention, treatment options (conservative vs invasive). I suggested patient try conservative therapy with physical therapy management. If this fails we may increase gabapentin, consider MRI of the lumbar spine, and consider referral to pain management.    Penni Bombard, MD [id number], [id number] AM Certified in Neurology, Neurophysiology and Neuroimaging  [facility] [address] [place], [place] [address] [phone]

## 2016-02-22 DIAGNOSIS — G4700 Insomnia, unspecified: Secondary | ICD-10-CM | POA: Insufficient documentation

## 2016-02-22 DIAGNOSIS — A6000 Herpesviral infection of urogenital system, unspecified: Secondary | ICD-10-CM | POA: Insufficient documentation

## 2016-02-22 DIAGNOSIS — R7989 Other specified abnormal findings of blood chemistry: Secondary | ICD-10-CM | POA: Insufficient documentation

## 2016-02-22 DIAGNOSIS — J309 Allergic rhinitis, unspecified: Secondary | ICD-10-CM | POA: Insufficient documentation

## 2016-02-22 DIAGNOSIS — K6289 Other specified diseases of anus and rectum: Secondary | ICD-10-CM | POA: Insufficient documentation

## 2016-07-28 ENCOUNTER — Encounter: Payer: Self-pay | Admitting: Diagnostic Neuroimaging

## 2016-07-28 ENCOUNTER — Ambulatory Visit (INDEPENDENT_AMBULATORY_CARE_PROVIDER_SITE_OTHER): Payer: Medicare Other | Admitting: Diagnostic Neuroimaging

## 2016-07-28 VITALS — BP 132/66 | HR 66 | Wt 181.2 lb

## 2016-07-28 DIAGNOSIS — E1142 Type 2 diabetes mellitus with diabetic polyneuropathy: Secondary | ICD-10-CM

## 2016-07-28 MED ORDER — GABAPENTIN 600 MG PO TABS: 600.0000 mg | ORAL_TABLET | Freq: Three times a day (TID) | ORAL | 4 refills | Status: DC

## 2016-07-28 NOTE — Progress Notes (Signed)
[facility]  PATIENT: Shannon Hodge DOB: 02-19-1946  REFERRING CLINICIAN:  HISTORY FROM: patient REASON FOR VISIT: follow up   HISTORICAL  CHIEF COMPLAINT:  Chief Complaint  Patient presents with  . Diabetic polyneuropathy assoc w/Type 2 diabetes    rm 7, "symptoms worsening"   . Follow-up    pt requested- symptoms getting worse    HISTORY OF PRESENT ILLNESS:   UPDATE 07/28/16: Since last visit, sxs are slightly worse. Night time cramping, pain and interrupted sleep.  UPDATE 11/23/15: Since last visit, neuropathy sxs are stable. Exercising more and doing well. Nighttime cramps.   UPDATE 05/20/15: Since last visit, tried PT and using gabapentin. Still with cramps, numbness and left hip pain.  PRIOR HPI (11/04/14): 70 year old right-handed male here for evaluation of low back pain, leg cramps, neuropathy. Patient reports some symptoms starting around age 52-50 years old, but more significant symptoms in the last 6-12 months. In his 77s, patient developed low back pain, rating left side, left leg, with mild left foot drop. Patient was diagnosed with some degenerative lumbar spine disease, possible left L5 radiculopathy, treated conservatively. Patient had lingering symptoms throughout his life. Occasionally he would have cramping, pain in his left leg. In 2000, patient diagnosed diabetes. Over past 1-2 years he has noticed some numbness and tingling in his toes and feet, mainly the bottom. He feels a thick, numb, dead sensation in his feet. No significant electrical, stinging, pins and needles pain in his feet. Patient started on gabapentin by PCP which has mildly helped his numbness symptoms. Now patient having more problems with cramping in his left leg, hamstring region, calf, intermittently, mainly when he lays down or sits down for a long time. Moving, standing, stretching seems to help. He is fairly active at the gym several times per week, using upper body strength  machines, weightlifting, treadmill walking.   REVIEW OF SYSTEMS: Full 14 system review of systems performed and negative: except for freq waking.    ALLERGIES: No Known Allergies  HOME MEDICATIONS: Outpatient Medications Prior to Visit  Medication Sig Dispense Refill  . aspirin 81 MG tablet Take 81 mg by mouth daily. 1/2 tab daily    . fenofibrate micronized (LOFIBRA) 134 MG capsule   1  . gabapentin (NEURONTIN) 600 MG tablet   5  . ibuprofen (ADVIL,MOTRIN) 200 MG tablet Take 200 mg by mouth every 6 (six) hours as needed.    Marland Kitchen lisinopril (PRINIVIL,ZESTRIL) 20 MG tablet   1  . metFORMIN (GLUCOPHAGE-XR) 500 MG 24 hr tablet   1  . simvastatin (ZOCOR) 20 MG tablet   1  . valACYclovir (VALTREX) 500 MG tablet   2  . zolpidem (AMBIEN) 10 MG tablet Take 10 mg by mouth at bedtime as needed for sleep. Pt take small amounts cut off the main pill     No facility-administered medications prior to visit.     PAST MEDICAL HISTORY: Past Medical History:  Diagnosis Date  . Diabetes ([place])   . Hypercholesteremia   . Hyperlipidemia   . Hypertension     PAST SURGICAL HISTORY: No past surgical history on file.  FAMILY HISTORY: Family History  Problem Relation Age of Onset  . Pancreatic cancer Mother   . Stroke Father   . Brain cancer Father   . Diabetes Father     SOCIAL HISTORY:  Social History   Social History  . Marital status: Married    Spouse name: Diane   . Number of children:  3  . Years of education: post grad   Occupational History  . retired     Social History Main Topics  . Smoking status: Never Smoker  . Smokeless tobacco: Never Used  . Alcohol use 0.0 oz/week     Comment: occasionally   . Drug use: No  . Sexual activity: Not on file   Other Topics Concern  . Not on file   Social History Narrative   Lives with wife    Drinks 2 cups of coffee a day     PHYSICAL EXAM  Vitals:   07/28/16 1251  BP: 132/66  Pulse: 66  Weight: 181 lb 3.2 oz (82.2 kg)      Body mass index is 24.58 kg/m.  No exam data present  No flowsheet data found.   GENERAL EXAM: Patient is in no distress; well developed, nourished and groomed; neck is supple  CARDIOVASCULAR: Regular rate and rhythm, no murmurs  NEUROLOGIC: MENTAL STATUS: awake, alert, language fluent, comprehension intact, naming intact, fund of knowledge appropriate CRANIAL NERVE: pupils equal and reactive to light, visual fields full to confrontation, extraocular muscles intact, no nystagmus, facial sensation and strength symmetric, hearing intact, palate elevates symmetrically, uvula midline, shoulder shrug symmetric, tongue midline. MOTOR: normal bulk and tone, full strength in the BUE, BLE SENSORY: normal and symmetric to light touch, temperature, vibration COORDINATION: finger-nose-finger --> MILD INTENTION TREMOR REFLEXES: BUE 1, KNEES 1, ANKLES TRACE GAIT/STATION: narrow based gait; able to walk on toes; MILD LEFT FOOT DROP WEAKNESS ON HEEL WALKING; romberg is negative    DIAGNOSTIC DATA (LABS, IMAGING, TESTING) - I reviewed patient records, labs, notes, testing and imaging myself where available.  No results found for: WBC, HGB, HCT, MCV, PLT No results found for: NA, K, CL, CO2, GLUCOSE, BUN, CREATININE, CALCIUM, PROT, ALBUMIN, AST, ALT, ALKPHOS, BILITOT, GFRNONAA, GFRAA No results found for: CHOL, HDL, LDLCALC, LDLDIRECT, TRIG, CHOLHDL No results found for: HGBA1C No results found for: VITAMINB12 No results found for: TSH      ASSESSMENT AND PLAN  70 y.o. year old male here with 30+ years of low back pain, radiating to left leg, with intermittent left leg cramps / pain.    Dx: left lumbar radiculopathy (L5) + diabetic neuropathy + muscle cramps (due to neuropathy and radiculopathy and restless leg syndrome)   PLAN:  - increase gabapentin to 600mg  at 6pm / 900mg  at 10pm - continue physical activities - may consider cymbalta or amitriptyline in future - may  consider MRI lumbar spine in future  Meds ordered this encounter  Medications  . gabapentin (NEURONTIN) 600 MG tablet    Sig: Take 1 tablet (600 mg total) by mouth 3 (three) times daily.    Dispense:  270 tablet    Refill:  4   Return in about 6 months (around 01/26/2017).  We discussed physical therapy, fitness, stretching, conditioning, injury prevention, treatment options (conservative vs invasive). I suggested patient try conservative therapy with physical therapy management. If this fails we may increase gabapentin, consider MRI of the lumbar spine, and consider referral to pain management.   Penni Bombard, MD [id number], [id number] PM Certified in Neurology, Neurophysiology and Neuroimaging  [facility] [address] [place], [place] [address] [phone]

## 2016-08-31 DIAGNOSIS — E782 Mixed hyperlipidemia: Secondary | ICD-10-CM | POA: Diagnosis not present

## 2016-08-31 DIAGNOSIS — E1149 Type 2 diabetes mellitus with other diabetic neurological complication: Secondary | ICD-10-CM | POA: Diagnosis not present

## 2016-09-01 DIAGNOSIS — E1149 Type 2 diabetes mellitus with other diabetic neurological complication: Secondary | ICD-10-CM | POA: Diagnosis not present

## 2016-09-01 DIAGNOSIS — E1122 Type 2 diabetes mellitus with diabetic chronic kidney disease: Secondary | ICD-10-CM | POA: Diagnosis not present

## 2016-09-01 DIAGNOSIS — Z0000 Encounter for general adult medical examination without abnormal findings: Secondary | ICD-10-CM | POA: Diagnosis not present

## 2016-09-01 DIAGNOSIS — J3089 Other allergic rhinitis: Secondary | ICD-10-CM | POA: Diagnosis not present

## 2016-09-01 DIAGNOSIS — A6000 Herpesviral infection of urogenital system, unspecified: Secondary | ICD-10-CM | POA: Diagnosis not present

## 2016-09-01 DIAGNOSIS — D649 Anemia, unspecified: Secondary | ICD-10-CM | POA: Insufficient documentation

## 2016-09-01 DIAGNOSIS — F5102 Adjustment insomnia: Secondary | ICD-10-CM | POA: Diagnosis not present

## 2016-09-01 DIAGNOSIS — E1142 Type 2 diabetes mellitus with diabetic polyneuropathy: Secondary | ICD-10-CM | POA: Diagnosis not present

## 2016-09-01 DIAGNOSIS — E782 Mixed hyperlipidemia: Secondary | ICD-10-CM | POA: Diagnosis not present

## 2016-09-01 DIAGNOSIS — N183 Chronic kidney disease, stage 3 (moderate): Secondary | ICD-10-CM | POA: Diagnosis not present

## 2016-10-03 DIAGNOSIS — Z23 Encounter for immunization: Secondary | ICD-10-CM | POA: Diagnosis not present

## 2016-10-05 DIAGNOSIS — R195 Other fecal abnormalities: Secondary | ICD-10-CM | POA: Diagnosis not present

## 2016-10-13 ENCOUNTER — Other Ambulatory Visit: Payer: Self-pay | Admitting: Gastroenterology

## 2016-10-18 ENCOUNTER — Telehealth: Payer: Self-pay | Admitting: Diagnostic Neuroimaging

## 2016-10-18 DIAGNOSIS — M62838 Other muscle spasm: Secondary | ICD-10-CM | POA: Diagnosis not present

## 2016-10-18 MED ORDER — DULOXETINE HCL 30 MG PO CPEP: 30.0000 mg | ORAL_CAPSULE | Freq: Every day | ORAL | 6 refills | Status: DC

## 2016-10-18 NOTE — Telephone Encounter (Signed)
I sent in rx to [facility] [place].   Meds ordered this encounter  Medications  . DULoxetine (CYMBALTA) 30 MG capsule    Sig: Take 1 capsule (30 mg total) by mouth daily.    Dispense:  30 capsule    Refill:  6   -VRP

## 2016-10-18 NOTE — Telephone Encounter (Signed)
Patient called office in reference to seeing if he is able to have an MRI l-spine ordered due to the chronic low back pain.  Please call

## 2016-10-18 NOTE — Telephone Encounter (Signed)
May use tylenol as needed for pain. For maintenance of pain control, we can add duloxetine 30mg  daily. -VRP

## 2016-10-18 NOTE — Addendum Note (Signed)
Addended byAndrey Spearman on: 10/18/2016 04:33 PM   Modules accepted: Orders

## 2016-10-18 NOTE — Telephone Encounter (Signed)
Called patient for further information. He stated that he continues to have lower/ middle back pain, "L5 pain". He stated he recently fell playing pickle ball, a "controlled fall", and he is seeing his PCP today for that issue which resulted in new left sided pain. He stated he is "doing what Dr Leta Baptist suggested", exercising, doing PT on his own. He stated that he is taking Gabapentin 900 mg with supper and 900 mg at bedtime. He is requesting a generic medication to take for his back pain instead of Ibuprofen due to his history of colitis. Although he stated "Dr Wynetta Emery gave me permission to take Ibuprofen every now and then."  He stated his biggest problem is still night time foot /calf cramping. This RN stated will route his request to Dr Leta Baptist and call him back. He verbalized understanding, appreciation.

## 2016-10-18 NOTE — Telephone Encounter (Signed)
Spoke with patient and informed him of Dr Gladstone Lighter reply. Patient stated he has tried tylenol without much success. He requested [facility] [place].   This RN inquired about his visit to PCP today. Patient stated she gave him a trial of Tizanidine for possible muscle spasms due to fall. This RN stated his request for new Rx will be sent to Dr Leta Baptist. He verbalized understanding, appreciation of call.

## 2016-10-19 NOTE — Telephone Encounter (Signed)
Spoke with patient and gave him information on [email or web address] He stated he would [facility] Rx [facility] and take it to [facility]. He verbalized understanding, appreciation of call.

## 2016-10-19 NOTE — Telephone Encounter (Signed)
Patient called office in reference to picking up Cymbalta (generic) from pharmacy.  Per patient he states medication is a tier 4 costing $100 a month, patient mentioned amitriptyline is way cheaper being only a tier 2.  Patient states pharmacy recommended vanlafaxin ER and it will need a PA.  Patient would like a returned call as far to suggestions as far as switching to a cheaper medication.  Please call

## 2016-10-19 NOTE — Telephone Encounter (Signed)
Refer to [email or web address]. Should be ~ $30 per month or less.

## 2016-10-24 ENCOUNTER — Telehealth: Payer: Self-pay | Admitting: Diagnostic Neuroimaging

## 2016-10-24 NOTE — Telephone Encounter (Signed)
Pt should clarify with GI team; I recommend to continue meds for now. -VRP

## 2016-10-24 NOTE — Telephone Encounter (Signed)
Per Dr Leta Baptist, spoke with patient and advised he should clarify with GI dr's office. Advised that Dr Leta Baptist recommends to continue medications. Patient stated he was told to only take morning medications the day of colonoscopy. This RN advised he follow GI protocol.  Patient stated he did feel Cymbalta was helping with his pain, although he has only been on medication 4 days. Patient verbalized understanding. appreciation.

## 2016-10-24 NOTE — Telephone Encounter (Signed)
Patient calling to discuss if it is ok for him to discontinue DULoxetine (CYMBALTA) 30 MG capsule for possibly 1-2 days before colonoscopy which is scheduled in 9 days.

## 2016-10-27 ENCOUNTER — Encounter (HOSPITAL_COMMUNITY): Payer: Self-pay | Admitting: *Deleted

## 2016-10-30 DIAGNOSIS — S86019A Strain of unspecified Achilles tendon, initial encounter: Secondary | ICD-10-CM

## 2016-10-30 HISTORY — DX: Strain of unspecified achilles tendon, initial encounter: S86.019A

## 2016-11-01 ENCOUNTER — Ambulatory Visit (HOSPITAL_COMMUNITY): Payer: Medicare Other | Admitting: Certified Registered"

## 2016-11-01 ENCOUNTER — Encounter (HOSPITAL_COMMUNITY): Payer: Self-pay | Admitting: Certified Registered"

## 2016-11-01 ENCOUNTER — Ambulatory Visit (HOSPITAL_COMMUNITY)
Admission: RE | Admit: 2016-11-01 | Discharge: 2016-11-01 | Disposition: A | Discharge status: Home / Self Care | Payer: Medicare Other | Source: Ambulatory Visit | Attending: Gastroenterology | Admitting: Gastroenterology

## 2016-11-01 ENCOUNTER — Encounter (HOSPITAL_COMMUNITY)
Admission: RE | Disposition: A | Discharge status: Home / Self Care | Payer: Self-pay | Source: Ambulatory Visit | Attending: Gastroenterology

## 2016-11-01 DIAGNOSIS — M549 Dorsalgia, unspecified: Secondary | ICD-10-CM | POA: Insufficient documentation

## 2016-11-01 DIAGNOSIS — D500 Iron deficiency anemia secondary to blood loss (chronic): Secondary | ICD-10-CM | POA: Diagnosis not present

## 2016-11-01 DIAGNOSIS — I10 Essential (primary) hypertension: Secondary | ICD-10-CM | POA: Insufficient documentation

## 2016-11-01 DIAGNOSIS — K315 Obstruction of duodenum: Secondary | ICD-10-CM | POA: Insufficient documentation

## 2016-11-01 DIAGNOSIS — E7800 Pure hypercholesterolemia, unspecified: Secondary | ICD-10-CM | POA: Insufficient documentation

## 2016-11-01 DIAGNOSIS — K319 Disease of stomach and duodenum, unspecified: Secondary | ICD-10-CM | POA: Insufficient documentation

## 2016-11-01 DIAGNOSIS — K259 Gastric ulcer, unspecified as acute or chronic, without hemorrhage or perforation: Secondary | ICD-10-CM | POA: Diagnosis not present

## 2016-11-01 DIAGNOSIS — R195 Other fecal abnormalities: Secondary | ICD-10-CM | POA: Diagnosis not present

## 2016-11-01 DIAGNOSIS — G8929 Other chronic pain: Secondary | ICD-10-CM | POA: Diagnosis not present

## 2016-11-01 DIAGNOSIS — E119 Type 2 diabetes mellitus without complications: Secondary | ICD-10-CM | POA: Insufficient documentation

## 2016-11-01 HISTORY — PX: COLONOSCOPY WITH PROPOFOL: SHX5780

## 2016-11-01 HISTORY — DX: Type 2 diabetes mellitus with diabetic neuropathy, unspecified: E11.40

## 2016-11-01 HISTORY — DX: Unspecified osteoarthritis, unspecified site: M19.90

## 2016-11-01 HISTORY — PX: ESOPHAGOGASTRODUODENOSCOPY (EGD) WITH PROPOFOL: SHX5813

## 2016-11-01 LAB — GLUCOSE, CAPILLARY: Glucose-Capillary: 107 mg/dL — ABNORMAL HIGH (ref 65–99)

## 2016-11-01 SURGERY — COLONOSCOPY WITH PROPOFOL
Anesthesia: Monitor Anesthesia Care

## 2016-11-01 MED ORDER — ONDANSETRON HCL 4 MG/2ML IJ SOLN
INTRAMUSCULAR | Status: DC | PRN
  Administered 2016-11-01 (×2): 4 mg via INTRAVENOUS

## 2016-11-01 MED ORDER — SODIUM CHLORIDE 0.9 % IV SOLN: INTRAVENOUS | Status: DC

## 2016-11-01 MED ORDER — PROPOFOL 10 MG/ML IV BOLUS
INTRAVENOUS | Status: DC | PRN
  Administered 2016-11-01 (×10): 40 mg via INTRAVENOUS

## 2016-11-01 MED ORDER — PHENYLEPHRINE 40 MCG/ML (10ML) SYRINGE FOR IV PUSH (FOR BLOOD PRESSURE SUPPORT)
PREFILLED_SYRINGE | INTRAVENOUS | Status: DC | PRN
  Administered 2016-11-01: 80 ug via INTRAVENOUS

## 2016-11-01 MED ORDER — LIDOCAINE 2% (20 MG/ML) 5 ML SYRINGE
INTRAMUSCULAR | Status: DC | PRN
  Administered 2016-11-01: 40 mg via INTRAVENOUS

## 2016-11-01 MED ORDER — LACTATED RINGERS IV SOLN
INTRAVENOUS | Status: DC
  Administered 2016-11-01 (×2): via INTRAVENOUS

## 2016-11-01 MED ORDER — PROPOFOL 10 MG/ML IV BOLUS
INTRAVENOUS | Status: AC
  Filled 2016-11-01: qty 40

## 2016-11-01 SURGICAL SUPPLY — 21 items

## 2016-11-01 NOTE — Anesthesia Preprocedure Evaluation (Addendum)
Anesthesia Evaluation  Patient identified by MRN, date of birth, ID band Patient awake    Reviewed: Allergy & Precautions, NPO status , Patient's Chart, lab work & pertinent test results  Airway Mallampati: I  TM Distance: >3 FB Neck ROM: Full    Dental  (+) Dental Advisory Given   Pulmonary    Pulmonary exam normal        Cardiovascular hypertension, Pt. on medications Normal cardiovascular exam     Neuro/Psych    GI/Hepatic   Endo/Other  diabetes, Type 2, Oral Hypoglycemic Agents  Renal/GU      Musculoskeletal   Abdominal   Peds  Hematology   Anesthesia Other Findings   Reproductive/Obstetrics                            Anesthesia Physical Anesthesia Plan  ASA: II  Anesthesia Plan: MAC   Post-op Pain Management:    Induction: Intravenous  Airway Management Planned: Simple Face Mask  Additional Equipment:   Intra-op Plan:   Post-operative Plan:   Informed Consent: I have reviewed the patients History and Physical, chart, labs and discussed the procedure including the risks, benefits and alternatives for the proposed anesthesia with the patient or authorized representative who has indicated his/her understanding and acceptance.     Plan Discussed with: CRNA and Surgeon  Anesthesia Plan Comments:         Anesthesia Quick Evaluation

## 2016-11-01 NOTE — Anesthesia Postprocedure Evaluation (Signed)
Anesthesia Post Note  Patient: Shannon Hodge  Procedure(s) Performed: Procedure(s) (LRB): COLONOSCOPY WITH PROPOFOL (N/A) ESOPHAGOGASTRODUODENOSCOPY (EGD) WITH PROPOFOL (N/A)  Patient location during evaluation: PACU Anesthesia Type: MAC Level of consciousness: awake and alert Pain management: pain level controlled Vital Signs Assessment: post-procedure vital signs reviewed and stable Respiratory status: spontaneous breathing, nonlabored ventilation, respiratory function stable and patient connected to nasal cannula oxygen Cardiovascular status: stable and blood pressure returned to baseline Anesthetic complications: no       Last Vitals:  Vitals:   11/01/16 1044 11/01/16 1050  BP: 109/74 125/63  Pulse: (!) 58 (!) 57  Resp: 16 17  Temp:      Last Pain:  Vitals:   11/01/16 1037  TempSrc: Oral                 Callan Norden DAVID

## 2016-11-01 NOTE — Op Note (Signed)
[facility] Patient Name: Shannon Hodge Procedure Date: 11/01/2016 MRN: [id number] Attending MD: Garlan Fair , MD Date of Birth: November 08, 1945 CSN: [id number] Age: 71 Admit Type: Outpatient Procedure:                EGD with Savory Dilation Under Fluro Indications:              Anemia secondary to chronic blood loss (heme                            positive stool and hemoglobin 12.4 grams) Providers:                Garlan Fair, MD, Carolynn Comment, RN,                            William Dalton, Technician Referring MD:              Medicines:                Propofol per Anesthesia Complications:            No immediate complications. Estimated Blood Loss:     Estimated blood loss: none. Procedure:                Pre-Anesthesia Assessment:                           - Prior to the procedure, a History and Physical                            was performed, and patient medications and                            allergies were reviewed. The patient's tolerance of                            previous anesthesia was also reviewed. The risks                            and benefits of the procedure and the sedation                            options and risks were discussed with the patient.                            All questions were answered, and informed consent                            was obtained. Prior Anticoagulants: The patient has                            taken aspirin, last dose was day of procedure. ASA                            Grade Assessment: II - A patient with mild systemic  disease. After reviewing the risks and benefits,                            the patient was deemed in satisfactory condition to                            undergo the procedure.                           - Prior to the procedure, a History and Physical                            was performed, and patient medications and   allergies were reviewed. The patient's tolerance of                            previous anesthesia was also reviewed. The risks                            and benefits of the procedure and the sedation                            options and risks were discussed with the patient.                            All questions were answered, and informed consent                            was obtained. Prior Anticoagulants: The patient has                            taken aspirin, last dose was day of procedure. ASA                            Grade Assessment: II - A patient with mild systemic                            disease. After reviewing the risks and benefits,                            the patient was deemed in satisfactory condition to                            undergo the procedure.                           After obtaining informed consent, the endoscope was                            passed under direct vision. Throughout the                            procedure, the patient's blood pressure, pulse, and  oxygen saturations were monitored continuously. The                            was introduced through the mouth, and advanced to                            the duodenal bulb. The upper GI endoscopy was                            accomplished without difficulty. The patient                            tolerated the procedure well. Scope In: Scope Out: Findings:      The Z-line was regular and was found 42 cm from the incisors.      The examined esophagus was normal.      A few localized, non-bleeding linear erosions were found in the       prepyloric region of the stomach. There were no stigmata of recent       bleeding.      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 Impression:               - Z-line regular, 42 cm from the incisors.                            - Normal esophagus.                           - Non-bleeding erosive gastropathy.                           - Normal examined duodenum.                           - No specimens collected. Moderate Sedation:      N/A- Per Anesthesia Care Recommendation:           - Patient has a contact number available for                            emergencies. The signs and symptoms of potential                            delayed complications were discussed with the                            patient. Return to normal activities tomorrow.                            Written discharge instructions were provided to the                            patient.                           -  Return to primary care physician PRN. Procedure Code(s):        --- Professional ---                           [phone], Esophagogastroduodenoscopy, flexible,                            transoral; diagnostic, including collection of                            specimen(s) by brushing or washing, when performed                            (separate procedure) Diagnosis Code(s):        --- Professional ---                           K31.89, Other diseases of stomach and duodenum                           D50.0, Iron deficiency anemia secondary to blood                            loss (chronic) CPT copyright 2016 American Medical Association. All rights reserved. The codes documented in this report are preliminary and upon coder review may  be revised to meet current compliance requirements. [name], MD Garlan Fair, MD 11/01/2016 10:47:57 AM This report has been signed electronically. Number of Addenda: 0

## 2016-11-01 NOTE — Discharge Instructions (Signed)

## 2016-11-01 NOTE — Op Note (Signed)
[facility] Patient Name: Shannon Hodge Procedure Date: 11/01/2016 MRN: [id number] Attending MD: Garlan Fair , MD Date of Birth: February 10, 1946 CSN: [id number] Age: 71 Admit Type: Outpatient Procedure:                Colonoscopy Indications:              Heme positive stool and hemoglobin 12.4 grams Providers:                Garlan Fair, MD, Carolynn Comment, RN,                            William Dalton, Technician Referring MD:              Medicines:                Propofol per Anesthesia Complications:            No immediate complications. Estimated Blood Loss:     Estimated blood loss: none. Procedure:                Pre-Anesthesia Assessment:                           - Prior to the procedure, a History and Physical                            was performed, and patient medications and                            allergies were reviewed. The patient's tolerance of                            previous anesthesia was also reviewed. The risks                            and benefits of the procedure and the sedation                            options and risks were discussed with the patient.                            All questions were answered, and informed consent                            was obtained. Prior Anticoagulants: The patient has                            taken aspirin, last dose was day of procedure. ASA                            Grade Assessment: II - A patient with mild systemic                            disease. After reviewing the risks and benefits,  the patient was deemed in satisfactory condition to                            undergo the procedure.                           After obtaining informed consent, the colonoscope                            was passed under direct vision. Throughout the                            procedure, the patient's blood pressure, pulse, and                            oxygen  saturations were monitored continuously. The                            EC-[id number] ([id number]) scope was introduced through                            the anus and advanced to the the cecum, identified                            by appendiceal orifice and ileocecal valve. The                            colonoscopy was performed without difficulty. The                            patient tolerated the procedure well. The quality                            of the bowel preparation was good. The appendiceal                            orifice and the rectum were photographed. Scope In: 10:13:59 AM Scope Out: 10:28:14 AM Scope Withdrawal Time: 0 hours 5 minutes 48 seconds  Total Procedure Duration: 0 hours 14 minutes 15 seconds  Findings:      The perianal and digital rectal examinations were normal.      The entire examined colon appeared normal. Impression:               - The entire examined colon is normal.                           - No specimens collected. Moderate Sedation:      N/A- Per Anesthesia Care Recommendation:           - Patient has a contact number available for                            emergencies. The signs and symptoms of potential  delayed complications were discussed with the                            patient. Return to normal activities tomorrow.                            Written discharge instructions were provided to the                            patient.                           - Repeat colonoscopy in 10 years for screening                            purposes.                           - Resume previous diet.                           - Continue present medications. Procedure Code(s):        --- Professional ---                           [phone], Colonoscopy, flexible; diagnostic, including                            collection of specimen(s) by brushing or washing,                            when performed (separate  procedure) Diagnosis Code(s):        --- Professional ---                           R19.5, Other fecal abnormalities CPT copyright 2016 American Medical Association. All rights reserved. The codes documented in this report are preliminary and upon coder review may  be revised to meet current compliance requirements. [name], MD Garlan Fair, MD 11/01/2016 10:37:05 AM This report has been signed electronically. Number of Addenda: 0

## 2016-11-01 NOTE — H&P (Signed)
Problem: Heme positive stool and anemia on daily Advil therapy. 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 10/05/2016 white blood cell count was 5300 and hemoglobin was 12.4 g.  History: The patient is a 71 year old male born 10-27-1945. Submitted stool samples for Hemoccult testing as part of his physical exam. His stool returned heme positive. His hemoglobin was 13.3 g. In July 2013 his colonoscopy showed active ulcerative proctitis only. He denies abdominal pain, diarrhea, or gas intestinal bleeding. Repeat hemoglobin returned 12 4 g. The patient was instructed to stop taking Advil. He is scheduled to undergo diagnostic esophagogastroduodenoscopy and colonoscopy.  Past medical history: Type 2 diabetes mellitus. Hypercholesterolemia. Chronic back pain.  Exam: The patient is alert and lying comfortably on the endoscopy stretcher. Abdomen is soft and nontender to palpation. Lungs are clear to auscultation. Cardiac exam reveals a regular rhythm.  Plan: Proceed with diagnostic esophagogastroduodenoscopy and colonoscopy.

## 2016-11-01 NOTE — Transfer of Care (Signed)
Immediate Anesthesia Transfer of Care Note  Patient: Shannon Hodge  Procedure(s) Performed: Procedure(s): COLONOSCOPY WITH PROPOFOL (N/A) ESOPHAGOGASTRODUODENOSCOPY (EGD) WITH PROPOFOL (N/A)  Patient Location: PACU  Anesthesia Type:MAC  Level of Consciousness: awake and alert   Airway & Oxygen Therapy: Patient Spontanous Breathing and Patient connected to nasal cannula oxygen  Post-op Assessment: Report given to RN and Post -op Vital signs reviewed and stable  Post vital signs: Reviewed and stable  Last Vitals:  Vitals:   11/01/16 0935 11/01/16 1037  BP: (!) 185/81   Pulse: 72 (!) (P) 58  Resp: 12 (P) 12  Temp: 36.4 C     Last Pain:  Vitals:   11/01/16 0935  TempSrc: Oral         Complications: No apparent anesthesia complications

## 2016-11-02 ENCOUNTER — Encounter (HOSPITAL_COMMUNITY): Payer: Self-pay | Admitting: Gastroenterology

## 2016-11-04 ENCOUNTER — Telehealth: Payer: Self-pay | Admitting: Diagnostic Neuroimaging

## 2016-11-04 MED ORDER — DULOXETINE HCL 30 MG PO CPEP: 30.0000 mg | ORAL_CAPSULE | Freq: Every day | ORAL | 1 refills | Status: DC

## 2016-11-04 NOTE — Telephone Encounter (Signed)
Spoke to pt and let him know that 90 day prescription was sent in to [facility].  Pt stated that 1-2 days he did have some GI issues but they have resolved and he feels like this is working.  FYI.

## 2016-11-04 NOTE — Addendum Note (Signed)
Addended byOliver Hum on: 11/04/2016 10:27 AM   Modules accepted: Orders

## 2016-11-04 NOTE — Telephone Encounter (Signed)
Patient requesting Rx for DULoxetine (CYMBALTA) 30 MG capsule #90 called to [facility]. In the past patient has gotten #30.

## 2016-11-15 ENCOUNTER — Other Ambulatory Visit: Payer: Self-pay | Admitting: Physician Assistant

## 2016-11-15 ENCOUNTER — Ambulatory Visit
Admission: RE | Admit: 2016-11-15 | Discharge: 2016-11-15 | Disposition: A | Discharge status: Home / Self Care | Payer: Medicare Other | Source: Ambulatory Visit | Attending: Physician Assistant | Admitting: Physician Assistant

## 2016-11-15 DIAGNOSIS — M25571 Pain in right ankle and joints of right foot: Secondary | ICD-10-CM

## 2016-11-15 DIAGNOSIS — S86011A Strain of right Achilles tendon, initial encounter: Secondary | ICD-10-CM

## 2016-11-21 DIAGNOSIS — S86011A Strain of right Achilles tendon, initial encounter: Secondary | ICD-10-CM | POA: Diagnosis not present

## 2016-11-25 DIAGNOSIS — R29898 Other symptoms and signs involving the musculoskeletal system: Secondary | ICD-10-CM | POA: Diagnosis not present

## 2016-11-28 ENCOUNTER — Ambulatory Visit: Payer: Medicare Other

## 2016-11-30 DIAGNOSIS — S86011D Strain of right Achilles tendon, subsequent encounter: Secondary | ICD-10-CM | POA: Diagnosis not present

## 2016-12-09 DIAGNOSIS — S86001D Unspecified injury of right Achilles tendon, subsequent encounter: Secondary | ICD-10-CM | POA: Diagnosis not present

## 2016-12-13 DIAGNOSIS — S86011D Strain of right Achilles tendon, subsequent encounter: Secondary | ICD-10-CM | POA: Diagnosis not present

## 2016-12-20 DIAGNOSIS — S86011D Strain of right Achilles tendon, subsequent encounter: Secondary | ICD-10-CM | POA: Diagnosis not present

## 2016-12-23 DIAGNOSIS — S86011D Strain of right Achilles tendon, subsequent encounter: Secondary | ICD-10-CM | POA: Diagnosis not present

## 2016-12-27 DIAGNOSIS — S86011D Strain of right Achilles tendon, subsequent encounter: Secondary | ICD-10-CM | POA: Diagnosis not present

## 2016-12-30 DIAGNOSIS — S86011D Strain of right Achilles tendon, subsequent encounter: Secondary | ICD-10-CM | POA: Diagnosis not present

## 2017-01-02 DIAGNOSIS — S86011D Strain of right Achilles tendon, subsequent encounter: Secondary | ICD-10-CM | POA: Diagnosis not present

## 2017-01-05 DIAGNOSIS — S86011D Strain of right Achilles tendon, subsequent encounter: Secondary | ICD-10-CM | POA: Diagnosis not present

## 2017-01-10 DIAGNOSIS — S86011D Strain of right Achilles tendon, subsequent encounter: Secondary | ICD-10-CM | POA: Diagnosis not present

## 2017-01-12 DIAGNOSIS — S86011D Strain of right Achilles tendon, subsequent encounter: Secondary | ICD-10-CM | POA: Diagnosis not present

## 2017-01-13 DIAGNOSIS — S86001D Unspecified injury of right Achilles tendon, subsequent encounter: Secondary | ICD-10-CM | POA: Diagnosis not present

## 2017-01-16 DIAGNOSIS — H2513 Age-related nuclear cataract, bilateral: Secondary | ICD-10-CM | POA: Diagnosis not present

## 2017-01-16 DIAGNOSIS — E119 Type 2 diabetes mellitus without complications: Secondary | ICD-10-CM | POA: Diagnosis not present

## 2017-01-16 DIAGNOSIS — H25013 Cortical age-related cataract, bilateral: Secondary | ICD-10-CM | POA: Diagnosis not present

## 2017-01-16 DIAGNOSIS — H40023 Open angle with borderline findings, high risk, bilateral: Secondary | ICD-10-CM | POA: Diagnosis not present

## 2017-01-17 DIAGNOSIS — S86011D Strain of right Achilles tendon, subsequent encounter: Secondary | ICD-10-CM | POA: Diagnosis not present

## 2017-01-19 DIAGNOSIS — S86011D Strain of right Achilles tendon, subsequent encounter: Secondary | ICD-10-CM | POA: Diagnosis not present

## 2017-01-23 DIAGNOSIS — S86011D Strain of right Achilles tendon, subsequent encounter: Secondary | ICD-10-CM | POA: Diagnosis not present

## 2017-01-27 ENCOUNTER — Encounter: Payer: Self-pay | Admitting: Diagnostic Neuroimaging

## 2017-01-27 ENCOUNTER — Ambulatory Visit (INDEPENDENT_AMBULATORY_CARE_PROVIDER_SITE_OTHER): Payer: Medicare Other | Admitting: Diagnostic Neuroimaging

## 2017-01-27 VITALS — BP 104/63 | HR 72 | Wt 174.4 lb

## 2017-01-27 DIAGNOSIS — M5416 Radiculopathy, lumbar region: Secondary | ICD-10-CM | POA: Diagnosis not present

## 2017-01-27 DIAGNOSIS — E1142 Type 2 diabetes mellitus with diabetic polyneuropathy: Secondary | ICD-10-CM

## 2017-01-27 MED ORDER — GABAPENTIN 600 MG PO TABS: 900.0000 mg | ORAL_TABLET | Freq: Two times a day (BID) | ORAL | 4 refills | Status: DC

## 2017-01-27 MED ORDER — DULOXETINE HCL 30 MG PO CPEP: 30.0000 mg | ORAL_CAPSULE | Freq: Every day | ORAL | 4 refills | Status: DC

## 2017-01-27 NOTE — Progress Notes (Signed)
[facility]  PATIENT: Shannon Hodge DOB: 02/16/1946  REFERRING CLINICIAN:  HISTORY FROM: patient REASON FOR VISIT: follow up   HISTORICAL  CHIEF COMPLAINT:  Chief Complaint  Patient presents with  . Diabetic polyneuropathy    rm 6, "foot/calf cramping at night"   . Follow-up    6 month    HISTORY OF PRESENT ILLNESS:   UPDATE 01/27/17: Since last visit, had right achilles tear on 11/15/16. Now healing and recovering. Overall pain in feet and legs are stable.  UPDATE 07/28/16: Since last visit, sxs are slightly worse. Night time cramping, pain and interrupted sleep.  UPDATE 11/23/15: Since last visit, neuropathy sxs are stable. Exercising more and doing well. Nighttime cramps.   UPDATE 05/20/15: Since last visit, tried PT and using gabapentin. Still with cramps, numbness and left hip pain.  PRIOR HPI (11/04/14): 71 year old right-handed male here for evaluation of low back pain, leg cramps, neuropathy. Patient reports some symptoms starting around age 18-46 years old, but more significant symptoms in the last 6-12 months. In his 28s, patient developed low back pain, rating left side, left leg, with mild left foot drop. Patient was diagnosed with some degenerative lumbar spine disease, possible left L5 radiculopathy, treated conservatively. Patient had lingering symptoms throughout his life. Occasionally he would have cramping, pain in his left leg. In 2000, patient diagnosed diabetes. Over past 1-2 years he has noticed some numbness and tingling in his toes and feet, mainly the bottom. He feels a thick, numb, dead sensation in his feet. No significant electrical, stinging, pins and needles pain in his feet. Patient started on gabapentin by PCP which has mildly helped his numbness symptoms. Now patient having more problems with cramping in his left leg, hamstring region, calf, intermittently, mainly when he lays down or sits down for a long time. Moving, standing,  stretching seems to help. He is fairly active at the gym several times per week, using upper body strength machines, weightlifting, treadmill walking.   REVIEW OF SYSTEMS: Full 14 system review of systems performed and negative: except for freq waking.    ALLERGIES: No Known Allergies  HOME MEDICATIONS: Outpatient Medications Prior to Visit  Medication Sig Dispense Refill  . acetaminophen (TYLENOL) 500 MG tablet Take 500 mg by mouth daily as needed for moderate pain or headache.    Marland Kitchen aspirin 325 MG tablet Take 81.25 mg by mouth daily.    . DULoxetine (CYMBALTA) 30 MG capsule Take 1 capsule (30 mg total) by mouth daily. 90 capsule 1  . fenofibrate micronized (LOFIBRA) 134 MG capsule Take 134 mg by mouth daily before breakfast.   1  . gabapentin (NEURONTIN) 600 MG tablet Take 1 tablet (600 mg total) by mouth 3 (three) times daily. (Patient taking differently: Take 900 mg by mouth 2 (two) times daily. ) 270 tablet 4  . lisinopril (PRINIVIL,ZESTRIL) 20 MG tablet Take 20 mg by mouth daily.   1  . Magnesium Oxide (MAG-200) 200 MG TABS Take 200 mg by mouth daily.    . metFORMIN (GLUCOPHAGE-XR) 500 MG 24 hr tablet Take 2,000 mg by mouth daily with supper.   1  . pyridOXINE (VITAMIN B-6) 100 MG tablet Take 100 mg by mouth every evening.    . simvastatin (ZOCOR) 20 MG tablet Take 20 mg by mouth every evening.   1  . valACYclovir (VALTREX) 500 MG tablet Take 500 mg by mouth daily as needed (outbreak).   2  . zolpidem (AMBIEN) 10 MG tablet Take  5 mg by mouth at bedtime as needed for sleep.      No facility-administered medications prior to visit.     PAST MEDICAL HISTORY: Past Medical History:  Diagnosis Date  . Diabetes ([place])    type 2  . Diabetic neuropathy ([facility])    both legs  . DJD (degenerative joint disease)    L 5  . Hypercholesteremia   . Hyperlipidemia   . Hypertension   . Neuropathy   . Ruptured Achilles tendon due to trauma 10/2016    PAST SURGICAL HISTORY: Past Surgical  History:  Procedure Laterality Date  . COLONOSCOPY WITH PROPOFOL N/A 11/01/2016   Procedure: COLONOSCOPY WITH PROPOFOL;  Surgeon: Garlan Fair, MD;  Location: [facility];  Service: Endoscopy;  Laterality: N/A;  . colonscopy     x 2  . ESOPHAGOGASTRODUODENOSCOPY (EGD) WITH PROPOFOL N/A 11/01/2016   Procedure: ESOPHAGOGASTRODUODENOSCOPY (EGD) WITH PROPOFOL;  Surgeon: Garlan Fair, MD;  Location: [facility];  Service: Endoscopy;  Laterality: N/A;    FAMILY HISTORY: Family History  Problem Relation Age of Onset  . Pancreatic cancer Mother   . Stroke Father   . Brain cancer Father   . Diabetes Father     SOCIAL HISTORY:  Social History   Social History  . Marital status: Married    Spouse name: Diane   . Number of children: 3  . Years of education: post grad   Occupational History  . retired     Social History Main Topics  . Smoking status: Never Smoker  . Smokeless tobacco: Never Used  . Alcohol use No  . Drug use: No  . Sexual activity: Not on file   Other Topics Concern  . Not on file   Social History Narrative   Lives with wife    Drinks 2 cups of coffee a day     PHYSICAL EXAM  Vitals:   01/27/17 1114  BP: 104/63  Pulse: 72  Weight: 174 lb 6.4 oz (79.1 kg)    Body mass index is 23.65 kg/m.  No exam data present  No flowsheet data found.   GENERAL EXAM: Patient is in no distress; well developed, nourished and groomed; neck is supple  CARDIOVASCULAR: Regular rate and rhythm, no murmurs  NEUROLOGIC: MENTAL STATUS: awake, alert, language fluent, comprehension intact, naming intact, fund of knowledge appropriate CRANIAL NERVE: pupils equal and reactive to light, visual fields full to confrontation, extraocular muscles intact, no nystagmus, facial sensation and strength symmetric, hearing intact, palate elevates symmetrically, uvula midline, shoulder shrug symmetric, tongue midline. MOTOR: normal bulk and tone, full strength in the BUE,  BLE SENSORY: normal and symmetric to light touch, temperature, vibration COORDINATION: finger-nose-finger --> normal REFLEXES: BUE 1, KNEES 1, ANKLES TRACE GAIT/STATION: narrow based gait; able to walk on toes; MILD LEFT FOOT DROP WEAKNESS ON HEEL WALKING; romberg is negative    DIAGNOSTIC DATA (LABS, IMAGING, TESTING) - I reviewed patient records, labs, notes, testing and imaging myself where available.  No results found for: WBC, HGB, HCT, MCV, PLT No results found for: NA, K, CL, CO2, GLUCOSE, BUN, CREATININE, CALCIUM, PROT, ALBUMIN, AST, ALT, ALKPHOS, BILITOT, GFRNONAA, GFRAA No results found for: CHOL, HDL, LDLCALC, LDLDIRECT, TRIG, CHOLHDL No results found for: HGBA1C No results found for: VITAMINB12 No results found for: TSH      ASSESSMENT AND PLAN  71 y.o. year old male here with 30+ years of low back pain, radiating to left leg, with intermittent left leg cramps / pain.  Dx: left lumbar radiculopathy (L5) + diabetic neuropathy + muscle cramps (due to neuropathy and radiculopathy and restless leg syndrome)   PLAN:  I spent 15 minutes of face to face time with patient. Greater than 50% of time was spent in counseling and coordination of care with patient. In summary we discussed:  - continue gabapentin to 900mg  (6pm and 10pm) - continue duloxetine 30mg  daily  - continue physical activities - may consider MRI lumbar spine in future  Meds ordered this encounter  Medications  . gabapentin (NEURONTIN) 600 MG tablet    Sig: Take 1.5 tablets (900 mg total) by mouth 2 (two) times daily.    Dispense:  270 tablet    Refill:  4  . DULoxetine (CYMBALTA) 30 MG capsule    Sig: Take 1 capsule (30 mg total) by mouth daily.    Dispense:  90 capsule    Refill:  4   Return in about 1 year (around 01/27/2018).     Penni Bombard, MD 0/78/6754, 49:20 AM Certified in Neurology, Neurophysiology and Neuroimaging  [facility] [address] [place], [place] [address] [phone]

## 2017-01-30 DIAGNOSIS — S86011D Strain of right Achilles tendon, subsequent encounter: Secondary | ICD-10-CM | POA: Diagnosis not present

## 2017-02-07 DIAGNOSIS — S86011D Strain of right Achilles tendon, subsequent encounter: Secondary | ICD-10-CM | POA: Diagnosis not present

## 2017-02-10 DIAGNOSIS — S86011D Strain of right Achilles tendon, subsequent encounter: Secondary | ICD-10-CM | POA: Diagnosis not present

## 2017-02-13 DIAGNOSIS — S86011D Strain of right Achilles tendon, subsequent encounter: Secondary | ICD-10-CM | POA: Diagnosis not present

## 2017-02-16 DIAGNOSIS — S86011D Strain of right Achilles tendon, subsequent encounter: Secondary | ICD-10-CM | POA: Diagnosis not present

## 2017-02-20 DIAGNOSIS — S86011D Strain of right Achilles tendon, subsequent encounter: Secondary | ICD-10-CM | POA: Diagnosis not present

## 2017-02-24 DIAGNOSIS — E1142 Type 2 diabetes mellitus with diabetic polyneuropathy: Secondary | ICD-10-CM | POA: Diagnosis not present

## 2017-02-24 DIAGNOSIS — E782 Mixed hyperlipidemia: Secondary | ICD-10-CM | POA: Diagnosis not present

## 2017-03-01 DIAGNOSIS — E782 Mixed hyperlipidemia: Secondary | ICD-10-CM | POA: Diagnosis not present

## 2017-03-01 DIAGNOSIS — E1142 Type 2 diabetes mellitus with diabetic polyneuropathy: Secondary | ICD-10-CM | POA: Diagnosis not present

## 2017-03-01 DIAGNOSIS — A6000 Herpesviral infection of urogenital system, unspecified: Secondary | ICD-10-CM | POA: Diagnosis not present

## 2017-03-01 DIAGNOSIS — F5102 Adjustment insomnia: Secondary | ICD-10-CM | POA: Diagnosis not present

## 2017-03-01 DIAGNOSIS — D649 Anemia, unspecified: Secondary | ICD-10-CM | POA: Diagnosis not present

## 2017-03-01 DIAGNOSIS — N183 Chronic kidney disease, stage 3 (moderate): Secondary | ICD-10-CM | POA: Diagnosis not present

## 2017-03-01 DIAGNOSIS — E1122 Type 2 diabetes mellitus with diabetic chronic kidney disease: Secondary | ICD-10-CM | POA: Diagnosis not present

## 2017-03-28 DIAGNOSIS — H25013 Cortical age-related cataract, bilateral: Secondary | ICD-10-CM | POA: Diagnosis not present

## 2017-03-28 DIAGNOSIS — H25043 Posterior subcapsular polar age-related cataract, bilateral: Secondary | ICD-10-CM | POA: Diagnosis not present

## 2017-03-28 DIAGNOSIS — H2513 Age-related nuclear cataract, bilateral: Secondary | ICD-10-CM | POA: Diagnosis not present

## 2017-03-28 DIAGNOSIS — H02839 Dermatochalasis of unspecified eye, unspecified eyelid: Secondary | ICD-10-CM | POA: Diagnosis not present

## 2017-05-04 DIAGNOSIS — Z23 Encounter for immunization: Secondary | ICD-10-CM | POA: Diagnosis not present

## 2017-05-10 ENCOUNTER — Other Ambulatory Visit: Payer: Self-pay | Admitting: Diagnostic Neuroimaging

## 2017-06-17 DIAGNOSIS — N3001 Acute cystitis with hematuria: Secondary | ICD-10-CM | POA: Diagnosis not present

## 2017-07-14 DIAGNOSIS — S0501XA Injury of conjunctiva and corneal abrasion without foreign body, right eye, initial encounter: Secondary | ICD-10-CM | POA: Diagnosis not present

## 2017-08-30 DIAGNOSIS — D539 Nutritional anemia, unspecified: Secondary | ICD-10-CM | POA: Diagnosis not present

## 2017-08-30 DIAGNOSIS — E782 Mixed hyperlipidemia: Secondary | ICD-10-CM | POA: Diagnosis not present

## 2017-08-30 DIAGNOSIS — E1142 Type 2 diabetes mellitus with diabetic polyneuropathy: Secondary | ICD-10-CM | POA: Diagnosis not present

## 2017-08-30 LAB — BASIC METABOLIC PANEL
BUN: 19 (ref 4–21)
Creatinine: 1.2 (ref 0.6–1.3)
Glucose: 109
Potassium: 4.2 (ref 3.4–5.3)
SODIUM: 142 (ref 137–147)

## 2017-08-30 LAB — LIPID PANEL
CHOLESTEROL: 150 (ref 0–200)
HDL: 62 (ref 35–70)
LDL CALC: 71
Triglycerides: 105 (ref 40–160)

## 2017-08-30 LAB — CBC AND DIFFERENTIAL
HCT: 38 — AB (ref 41–53)
HEMOGLOBIN: 13.1 — AB (ref 13.5–17.5)
WBC: 4.8

## 2017-08-30 LAB — VITAMIN B12: Vitamin B-12: 234

## 2017-08-30 LAB — HEMOGLOBIN A1C: Hemoglobin A1C: 5.9

## 2017-09-01 DIAGNOSIS — J3089 Other allergic rhinitis: Secondary | ICD-10-CM | POA: Diagnosis not present

## 2017-09-01 DIAGNOSIS — E1142 Type 2 diabetes mellitus with diabetic polyneuropathy: Secondary | ICD-10-CM | POA: Diagnosis not present

## 2017-09-01 DIAGNOSIS — E782 Mixed hyperlipidemia: Secondary | ICD-10-CM | POA: Diagnosis not present

## 2017-09-01 DIAGNOSIS — E1122 Type 2 diabetes mellitus with diabetic chronic kidney disease: Secondary | ICD-10-CM | POA: Diagnosis not present

## 2017-09-01 DIAGNOSIS — F5102 Adjustment insomnia: Secondary | ICD-10-CM | POA: Diagnosis not present

## 2017-09-01 DIAGNOSIS — A6000 Herpesviral infection of urogenital system, unspecified: Secondary | ICD-10-CM | POA: Diagnosis not present

## 2017-09-01 DIAGNOSIS — N183 Chronic kidney disease, stage 3 (moderate): Secondary | ICD-10-CM | POA: Diagnosis not present

## 2017-09-13 ENCOUNTER — Encounter: Payer: Self-pay | Admitting: Emergency Medicine

## 2017-09-13 ENCOUNTER — Encounter: Payer: Self-pay | Admitting: Family Medicine

## 2017-09-21 ENCOUNTER — Other Ambulatory Visit: Payer: Self-pay

## 2017-09-21 ENCOUNTER — Ambulatory Visit (INDEPENDENT_AMBULATORY_CARE_PROVIDER_SITE_OTHER): Payer: Medicare Other | Admitting: Family Medicine

## 2017-09-21 ENCOUNTER — Encounter: Payer: Self-pay | Admitting: Family Medicine

## 2017-09-21 VITALS — Ht 72.25 in | Wt 176.4 lb

## 2017-09-21 DIAGNOSIS — R252 Cramp and spasm: Secondary | ICD-10-CM | POA: Diagnosis not present

## 2017-09-21 DIAGNOSIS — M5416 Radiculopathy, lumbar region: Secondary | ICD-10-CM

## 2017-09-21 DIAGNOSIS — H9190 Unspecified hearing loss, unspecified ear: Secondary | ICD-10-CM | POA: Insufficient documentation

## 2017-09-21 DIAGNOSIS — E1142 Type 2 diabetes mellitus with diabetic polyneuropathy: Secondary | ICD-10-CM | POA: Diagnosis not present

## 2017-09-21 DIAGNOSIS — Z23 Encounter for immunization: Secondary | ICD-10-CM | POA: Diagnosis not present

## 2017-09-21 DIAGNOSIS — M21372 Foot drop, left foot: Secondary | ICD-10-CM | POA: Insufficient documentation

## 2017-09-21 DIAGNOSIS — E782 Mixed hyperlipidemia: Secondary | ICD-10-CM | POA: Insufficient documentation

## 2017-09-21 DIAGNOSIS — G2581 Restless legs syndrome: Secondary | ICD-10-CM | POA: Insufficient documentation

## 2017-09-21 DIAGNOSIS — E538 Deficiency of other specified B group vitamins: Secondary | ICD-10-CM | POA: Diagnosis not present

## 2017-09-21 NOTE — Patient Instructions (Addendum)
Please return in 6 months for diabetes recheck.  Start taking OTC Vitamin B12 1000 mg units daily, and Vitamin D OTC 1000-2000 units daily.   Medicare recommends an Annual Wellness Visit for all patients. Please schedule this to be done with our Nurse Educator, Maudie Mercury. This is an informative "talk" visit; it's goals are to ensure that your health care needs are being met and to give you education regarding avoiding falls, ensuring you are not suffering from depression or problems with memory or thinking, and to educate you on Advance Care Planning. It helps me take good care of you!  It was a pleasure meeting you today! Thank you for choosing [place] to meet your healthcare needs! I truly look forward to working with you. If you have any questions or concerns, please send me a message via Mychart or call the office at [phone].

## 2017-09-21 NOTE — Progress Notes (Signed)
Subjective  CC:  Chief Complaint  Patient presents with  . Establish Care    Transfer from [facility]  . Back Pain  . Foot Pain    c/o lockup type cramps    HPI: Shannon Hodge is a 72 y.o. male who presents to [place] at [facility] today to establish care with me as a new patient. I have reviewed his old records over the last 6 years.  He has the following concerns or needs:   Type 2 diabetes with peripheral neuropathy - well controlled with last a1c 5.0; lowered metformin dose to 1500 daily. Eats well. Exercises regularly at the gym. Active peripheral neuropathy sxs that affect sleep. On neurontin with +/- benefit. Eye exam up to date. Due for pneumovax. Flu shot up to date.   Hyperlipidemia controlled on statin and fenofibrate. No concerns for statin induced myalgias on simvastatin. Didn't check lfts at most recent visit.   Neuropathy - multifactorial per neurology: on cymbalta, neurontin and vit b 6. Likely related to lumbar radiculopathy and diabetes related neuropathy with mild left foot drop. May also have RLS component. This hasn't been worked up with iron studies yet.   Chronic back pain  Mm cramps at night -  Painful  Recent labs show low vit b12 with mild macrocytosis w/o anemia. Nl folate levels.   We updated and reviewed the patient's past history in detail and it is documented below.  Patient Active Problem List   Diagnosis Date Noted  . Mixed hyperlipidemia 09/21/2017    Priority: High  . Controlled type 2 diabetes mellitus with diabetic polyneuropathy, without long-term current use of insulin ([place]) 09/01/2016    Priority: High  . Lumbar radiculopathy 11/04/2014    Priority: High  . Allergic rhinitis 02/22/2016    Priority: Low  . Restless leg syndrome 09/21/2017  . Hearing loss 09/21/2017  . Muscle cramp, nocturnal 09/21/2017  . Vitamin B12 deficiency 09/21/2017  . Foot drop, left 09/21/2017  . Genital herpes  02/22/2016  . Insomnia 02/22/2016  . 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   Health Maintenance  Topic Date Due  . Hepatitis C Screening  1945/09/25  . PNA vac Low Risk Adult (2 of 2 - PPSV23) 10/03/2017  . OPHTHALMOLOGY EXAM  01/16/2018  . HEMOGLOBIN A1C  02/28/2018  . FOOT EXAM  09/01/2018  . TETANUS/TDAP  01/26/2021  . COLONOSCOPY  11/02/2026  . INFLUENZA VACCINE  Completed   Immunization History  Administered Date(s) Administered  . Influenza, High Dose Seasonal PF 05/09/2016, 05/04/2017  . Influenza-Unspecified 05/01/2015  . Pneumococcal Conjugate-13 10/03/2016  . Pneumococcal Polysaccharide-23 01/27/2011  . Tdap 01/27/2011  . Zoster 01/19/2010   Current Meds  Medication Sig  . aspirin 325 MG tablet Take 81.25 mg by mouth daily.  . DULoxetine (CYMBALTA) 30 MG capsule take 1 capsule by mouth daily  . gabapentin (NEURONTIN) 600 MG tablet Take 1.5 tablets (900 mg total) by mouth 2 (two) times daily.  Marland Kitchen lisinopril (PRINIVIL,ZESTRIL) 20 MG tablet Take 20 mg by mouth daily.   . metFORMIN (GLUCOPHAGE-XR) 500 MG 24 hr tablet Take 1,500 mg by mouth daily with supper.   . pyridOXINE (VITAMIN B-6) 100 MG tablet Take 100 mg by mouth every evening.  . simvastatin (ZOCOR) 20 MG tablet Take 20 mg by mouth every evening.     Allergies: Patient has No Known Allergies. Past Medical History Patient  has a past medical history of Diabetes ([place]), Diabetic neuropathy (  [place]), DJD (degenerative joint disease), Hypercholesteremia, Hyperlipidemia, Hypertension, Neuropathy, and Ruptured Achilles tendon due to trauma (10/2016). Past Surgical History Patient  has a past surgical history that includes colonscopy; Colonoscopy with propofol (N/A, 11/01/2016); Esophagogastroduodenoscopy (egd) with propofol (N/A, 11/01/2016); and Tonsillectomy. Family History: Patient family history includes Brain cancer in his father; Diabetes in his father, maternal grandfather, maternal grandmother,  paternal grandfather, and paternal grandmother; Healthy in his daughter and son; Pancreatic cancer in his mother; Stroke in his father. Social History:  Patient  reports that  has never smoked. he has never used smokeless tobacco. He reports that he does not drink alcohol or use drugs.  Review of Systems: Constitutional: negative for fever or malaise Ophthalmic: negative for photophobia, double vision or loss of vision Cardiovascular: negative for chest pain, dyspnea on exertion, or new LE swelling Respiratory: negative for SOB or persistent cough Gastrointestinal: negative for abdominal pain, change in bowel habits or melena Genitourinary: negative for dysuria or gross hematuria Musculoskeletal: negative for new gait disturbance or muscular weakness Integumentary: negative for new or persistent rashes Neurological: negative for TIA or stroke symptoms Psychiatric: negative for SI or delusions Allergic/Immunologic: negative for hives  Patient Care Team    Relationship Specialty Notifications Start End  [name], MD PCP - General Family Medicine  09/21/17   Penni Bombard, MD Consulting Physician Neurology  09/21/17   Garlan Fair, MD Consulting Physician Gastroenterology  09/21/17     Objective  Vitals: Ht 6' 0.25" (1.835 m)   Wt 176 lb 6.4 oz (80 kg)   BMI 23.76 kg/m  General:  Well developed, well nourished, no acute distress  Psych:  Alert and oriented,normal mood and affect HEENT:  Normocephalic, atraumatic, non-icteric sclera, PERRL, oropharynx is without mass or exudate, supple neck without adenopathy, mass or thyromegaly Cardiovascular:  RRR without gallop, rub or murmur, nondisplaced PMI Respiratory:  Good breath sounds bilaterally, CTAB with normal respiratory effort MSK: no deformities, contusions. Joints are without erythema or swelling  Assessment  1. Controlled type 2 diabetes mellitus with diabetic polyneuropathy, without long-term current use of  insulin ([place])   2. Mixed hyperlipidemia   3. Muscle cramp, nocturnal   4. Lumbar radiculopathy   5. Vitamin B12 deficiency   6. Foot drop, left      Plan   Diabetes: This medical condition is well controlled. There are no signs of complications, medication side effects, or red flags. Patient is instructed to continue the current treatment plan without change in therapies or medications. Update 2nd pneumoococcal vaccine today.   Lipids: control is good. Will need lfts rechecked at next visit. Continue meds. Consider asking if meds could be contributing to leg pains.   Back pain and mm cramps: stretch, hydrate, tonic water, and add vit D  Neuropathy: continue meds per urology. Check iron studies next visit given possible RLS sxs   Insommia, secondary - melatonin   Vitamin b12 deficiency- start oral supplementation and recheck labs in 6 months.   Follow up:  Return in about 6 months (around 03/21/2018) for follow up Diabetes. schedule awv at patient's convenience  Commons side effects, risks, benefits, and alternatives for medications and treatment plan prescribed today were discussed, and the patient expressed understanding of the given instructions. Patient is instructed to call or message via MyChart if he/she has any questions or concerns regarding our treatment plan. No barriers to understanding were identified. We discussed Red Flag symptoms and signs in detail. Patient expressed understanding regarding what  to do in case of urgent or emergency type symptoms.   Medication list was reconciled, printed and provided to the patient in AVS. Patient instructions and summary information was reviewed with the patient as documented in the AVS. This note was prepared with assistance of Dragon voice recognition software. Occasional wrong-word or sound-a-like substitutions may have occurred due to the inherent limitations of voice recognition software  Orders Placed This Encounter  Procedures  .  Pneumococcal polysaccharide vaccine 23-valent greater than or equal to 2yo subcutaneous/IM   No orders of the defined types were placed in this encounter.

## 2017-10-06 DIAGNOSIS — L821 Other seborrheic keratosis: Secondary | ICD-10-CM | POA: Diagnosis not present

## 2017-10-06 DIAGNOSIS — X32XXXA Exposure to sunlight, initial encounter: Secondary | ICD-10-CM | POA: Diagnosis not present

## 2017-10-06 DIAGNOSIS — L570 Actinic keratosis: Secondary | ICD-10-CM | POA: Diagnosis not present

## 2017-10-06 DIAGNOSIS — D485 Neoplasm of uncertain behavior of skin: Secondary | ICD-10-CM | POA: Diagnosis not present

## 2017-10-06 DIAGNOSIS — D2239 Melanocytic nevi of other parts of face: Secondary | ICD-10-CM | POA: Diagnosis not present

## 2017-10-19 ENCOUNTER — Encounter: Payer: Self-pay | Admitting: Family Medicine

## 2017-10-19 ENCOUNTER — Other Ambulatory Visit: Payer: Self-pay

## 2017-10-19 ENCOUNTER — Ambulatory Visit (INDEPENDENT_AMBULATORY_CARE_PROVIDER_SITE_OTHER): Payer: Medicare Other | Admitting: Family Medicine

## 2017-10-19 VITALS — BP 130/80 | HR 65 | Temp 97.9°F | Ht 72.25 in | Wt 178.8 lb

## 2017-10-19 DIAGNOSIS — R310 Gross hematuria: Secondary | ICD-10-CM

## 2017-10-19 LAB — URINALYSIS, ROUTINE W REFLEX MICROSCOPIC
BILIRUBIN URINE: NEGATIVE
KETONES UR: NEGATIVE
LEUKOCYTES UA: NEGATIVE
Nitrite: NEGATIVE
Specific Gravity, Urine: 1.01 (ref 1.000–1.030)
Total Protein, Urine: NEGATIVE
UROBILINOGEN UA: 0.2 (ref 0.0–1.0)
Urine Glucose: NEGATIVE
pH: 7 (ref 5.0–8.0)

## 2017-10-19 LAB — POCT URINALYSIS DIPSTICK
BILIRUBIN UA: NEGATIVE
GLUCOSE UA: NEGATIVE
Ketones, UA: NEGATIVE
Leukocytes, UA: NEGATIVE
Nitrite, UA: NEGATIVE
ODOR: NEGATIVE
Protein, UA: NEGATIVE
Spec Grav, UA: 1.015 (ref 1.010–1.025)
Urobilinogen, UA: 0.2 E.U./dL
pH, UA: 6.5 (ref 5.0–8.0)

## 2017-10-19 NOTE — Progress Notes (Signed)
Subjective  CC:  Chief Complaint  Patient presents with  . Hematuria    x 1 day     HPI: Shannon Hodge is a 72 y.o. male who presents to the office today to address the problems listed above in the chief complaint.  Noted red blood spotting in undergarments yesterday with a few clots in bowl with urination. No red urine. No dysuria or penile discharge or irritation. No risk of STDs. No h/o hematuria. No back pain or fevers or chills. No h/o BPH. Active, exercises regularly and feels well.   I reviewed the patients updated PMH, FH, and SocHx.    Patient Active Problem List   Diagnosis Date Noted  . Restless leg syndrome 09/21/2017    Priority: High  . Mixed hyperlipidemia 09/21/2017    Priority: High  . Controlled type 2 diabetes mellitus with diabetic polyneuropathy, without long-term current use of insulin ([place]) 09/01/2016    Priority: High  . Insomnia 02/22/2016    Priority: High  . Lumbar radiculopathy 11/04/2014    Priority: High  . Hearing loss 09/21/2017    Priority: Medium  . Muscle cramp, nocturnal 09/21/2017    Priority: Medium  . Foot drop, left 09/21/2017    Priority: Medium  . 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 Vitamin B12 deficiency 09/21/2017    Priority: Low  . Allergic rhinitis 02/22/2016    Priority: Low  . Genital herpes 02/22/2016    Priority: Low  . 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 acetaminophen (TYLENOL) 500 MG tablet Take 500 mg by mouth daily as needed for moderate pain or headache.  Marland Kitchen aspirin 325 MG tablet Take 81.25 mg by mouth daily.  . DULoxetine (CYMBALTA) 30 MG capsule take 1 capsule by mouth daily  . fenofibrate micronized (LOFIBRA) 134 MG capsule Take 134 mg by mouth daily before breakfast.   . gabapentin (NEURONTIN) 600 MG tablet Take 1.5 tablets (900 mg total) by mouth 2 (two) times daily.  Marland Kitchen lisinopril (PRINIVIL,ZESTRIL) 20 MG tablet Take 20 mg by mouth daily.   . Magnesium Oxide (MAG-200) 200  MG TABS Take 400 mg by mouth daily.   . metFORMIN (GLUCOPHAGE-XR) 500 MG 24 hr tablet Take 1,500 mg by mouth daily with supper.   . pyridOXINE (VITAMIN B-6) 100 MG tablet Take 100 mg by mouth every evening.  . simvastatin (ZOCOR) 20 MG tablet Take 20 mg by mouth every evening.   . valACYclovir (VALTREX) 500 MG tablet Take 500 mg by mouth daily as needed (outbreak).     Allergies: Patient has No Known Allergies. Family History: Patient family history includes Brain cancer in his father; Diabetes in his father, maternal grandfather, maternal grandmother, paternal grandfather, and paternal grandmother; Healthy in his daughter and son; Pancreatic cancer in his mother; Stroke in his father. Social History:  Patient  reports that he has never smoked. He has never used smokeless tobacco. He reports that he does not drink alcohol or use drugs.  Review of Systems: Constitutional: Negative for fever malaise or anorexia Cardiovascular: negative for chest pain Respiratory: negative for SOB or persistent cough Gastrointestinal: negative for abdominal pain  Objective  Vitals: BP 130/80   Pulse 65   Temp 97.9 F (36.6 C)   Ht 6' 0.25" (1.835 m)   Wt 178 lb 12.8 oz (81.1 kg)   BMI 24.08 kg/m  General: no acute distress , A&Ox3 HEENT: PEERL, conjunctiva normal, Oropharynx moist,neck is supple  Cardiovascular:  RRR without murmur or gallop.  Respiratory:  Good breath sounds bilaterally, CTAB with normal respiratory effort Gastrointestinal: Gastrointestinal: soft, flat abdomen, normal active bowel sounds, no palpable masses, no hepatosplenomegaly, no appreciated hernias No back pain Skin:  Warm, no rashes  Office Visit on 10/19/2017  Component Date Value Ref Range Status  . Color, UA 10/19/2017 yellow   Final  . Clarity, UA 10/19/2017 cloudy   Final  . Glucose, UA 10/19/2017 neg   Final  . Bilirubin, UA 10/19/2017 neg   Final  . Ketones, UA 10/19/2017 neg   Final  . Spec Grav, UA 10/19/2017  1.015  1.010 - 1.025 Final  . Blood, UA 10/19/2017 3+   Final  . pH, UA 10/19/2017 6.5  5.0 - 8.0 Final  . Protein, UA 10/19/2017 neg   Final  . Urobilinogen, UA 10/19/2017 0.2  0.2 or 1.0 E.U./dL Final  . Nitrite, UA 10/19/2017 neg   Final  . Leukocytes, UA 10/19/2017 Negative  Negative Final  . Odor 10/19/2017 negative   Final    Assessment  1. Hematuria, gross      Plan   Gross hematuria :   R/o infection. Monitor. If recurs to urology for further evlauation. No evidence of systemic infection now nor prostate infection by symptoms.   Follow up: Return if symptoms worsen or fail to improve.    Commons side effects, risks, benefits, and alternatives for medications and treatment plan prescribed today were discussed, and the patient expressed understanding of the given instructions. Patient is instructed to call or message via MyChart if he/she has any questions or concerns regarding our treatment plan. No barriers to understanding were identified. We discussed Red Flag symptoms and signs in detail. Patient expressed understanding regarding what to do in case of urgent or emergency type symptoms.   Medication list was reconciled, printed and provided to the patient in AVS. Patient instructions and summary information was reviewed with the patient as documented in the AVS. This note was prepared with assistance of Dragon voice recognition software. Occasional wrong-word or sound-a-like substitutions may have occurred due to the inherent limitations of voice recognition software  Orders Placed This Encounter  Procedures  . Urine Culture  . Urinalysis, Routine w reflex microscopic  . POCT urinalysis dipstick   No orders of the defined types were placed in this encounter.

## 2017-10-19 NOTE — Patient Instructions (Addendum)
Please follow up if symptoms do not improve or as needed.   Please let me know IF your symptoms do not resolve, or it they resolve but return.   Make an appointment if you have pain or fever.    Hematuria, Adult Hematuria is blood in your urine. It can be caused by a bladder infection, kidney infection, prostate infection, kidney stone, or cancer of your urinary tract. Infections can usually be treated with medicine, and a kidney stone usually will pass through your urine. If neither of these is the cause of your hematuria, further workup to find out the reason may be needed. It is very important that you tell your health care provider about any blood you see in your urine, even if the blood stops without treatment or happens without causing pain. Blood in your urine that happens and then stops and then happens again can be a symptom of a very serious condition. Also, pain is not a symptom in the initial stages of many urinary cancers. Follow these instructions at home:  Drink lots of fluid, 3-4 quarts a day. If you have been diagnosed with an infection, cranberry juice is especially recommended, in addition to large amounts of water.  Avoid caffeine, tea, and carbonated beverages because they tend to irritate the bladder.  Avoid alcohol because it may irritate the prostate.  Take all medicines as directed by your health care provider.  If you were prescribed an antibiotic medicine, finish it all even if you start to feel better.  If you have been diagnosed with a kidney stone, follow your health care provider's instructions regarding straining your urine to catch the stone.  Empty your bladder often. Avoid holding urine for long periods of time.  After a bowel movement, women should cleanse front to back. Use each tissue only once.  Empty your bladder before and after sexual intercourse if you are a male. Contact a health care provider if:  You develop back pain.  You have a  fever.  You have a feeling of sickness in your stomach (nausea) or vomiting.  Your symptoms are not better in 3 days. Return sooner if you are getting worse. Get help right away if:  You develop severe vomiting and are unable to keep the medicine down.  You develop severe back or abdominal pain despite taking your medicines.  You begin passing a large amount of blood or clots in your urine.  You feel extremely weak or faint, or you pass out. This information is not intended to replace advice given to you by your health care provider. Make sure you discuss any questions you have with your health care provider. Document Released: 07/18/2005 Document Revised: 12/24/2015 Document Reviewed: 03/18/2013 Elsevier Interactive Patient Education  2017 [employer].

## 2017-10-20 LAB — URINE CULTURE
MICRO NUMBER:: 90358474
SPECIMEN QUALITY:: ADEQUATE

## 2017-10-25 ENCOUNTER — Encounter: Payer: Self-pay | Admitting: Family Medicine

## 2017-11-06 ENCOUNTER — Other Ambulatory Visit: Payer: Self-pay | Admitting: Emergency Medicine

## 2017-11-06 ENCOUNTER — Telehealth: Payer: Self-pay | Admitting: Emergency Medicine

## 2017-11-06 DIAGNOSIS — R319 Hematuria, unspecified: Secondary | ICD-10-CM

## 2017-11-06 NOTE — Telephone Encounter (Signed)
Reason for CRM: patient is calling and states that he was told by Dr. Jonni Sanger if he had anymore spotting then he would need to get a referral to a Urologist. Patient states he is still experiencing spotting after a day at the gym working out. Patient is calling for a referral and direction on where to go from here.   Patient states that he is not doing anymore strenuous activity since he noticed the blood in his underwear, he states about a year ago he had an injury to his left side.   Can this wait till Dr. Jonni Sanger Returns or should we do referral now?  Thanks,   [place] Marchiano. LPN

## 2017-11-06 NOTE — Telephone Encounter (Signed)
Ok to refer to urology- dx blood in urine

## 2017-11-06 NOTE — Telephone Encounter (Signed)
Referral for Urology sent.   Doloris Hall,  LPN

## 2017-11-13 ENCOUNTER — Telehealth: Payer: Self-pay | Admitting: Emergency Medicine

## 2017-11-13 NOTE — Telephone Encounter (Signed)
Patient called in inquiring about an appt for a  Hep C Screening. Advised patient that we could do this at follow-up appt in August.

## 2017-12-20 DIAGNOSIS — R310 Gross hematuria: Secondary | ICD-10-CM | POA: Diagnosis not present

## 2018-01-04 DIAGNOSIS — R310 Gross hematuria: Secondary | ICD-10-CM | POA: Diagnosis not present

## 2018-01-17 DIAGNOSIS — R310 Gross hematuria: Secondary | ICD-10-CM | POA: Diagnosis not present

## 2018-01-17 DIAGNOSIS — N35011 Post-traumatic bulbous urethral stricture: Secondary | ICD-10-CM | POA: Diagnosis not present

## 2018-01-17 DIAGNOSIS — Z87440 Personal history of urinary (tract) infections: Secondary | ICD-10-CM | POA: Diagnosis not present

## 2018-01-17 DIAGNOSIS — R3914 Feeling of incomplete bladder emptying: Secondary | ICD-10-CM | POA: Diagnosis not present

## 2018-02-07 ENCOUNTER — Other Ambulatory Visit: Payer: Self-pay | Admitting: Diagnostic Neuroimaging

## 2018-02-07 ENCOUNTER — Encounter: Payer: Self-pay | Admitting: Emergency Medicine

## 2018-02-07 DIAGNOSIS — H25013 Cortical age-related cataract, bilateral: Secondary | ICD-10-CM | POA: Diagnosis not present

## 2018-02-07 DIAGNOSIS — H2513 Age-related nuclear cataract, bilateral: Secondary | ICD-10-CM | POA: Diagnosis not present

## 2018-02-07 DIAGNOSIS — H40023 Open angle with borderline findings, high risk, bilateral: Secondary | ICD-10-CM | POA: Diagnosis not present

## 2018-02-07 DIAGNOSIS — E119 Type 2 diabetes mellitus without complications: Secondary | ICD-10-CM | POA: Diagnosis not present

## 2018-02-07 LAB — HM DIABETES EYE EXAM

## 2018-02-15 IMAGING — MR MR ANKLE*R* W/O CM
4 of 5 series · 18 of 40 positions shown · non-contrast
Comparison: None.

CLINICAL DATA: The patient felt a pop in the right ankle today
while playing pickleball with onset of posterior pain.

EXAM:
MRI OF THE RIGHT ANKLE WITHOUT CONTRAST
TECHNIQUE: Multiplanar, multisequence MR imaging of the ankle was performed. No
intravenous contrast was administered.

[Series 4: T2 fat-sat · axial · 3.5mm · 0.39mm/px · z∈[-111,+30]mm · 9 of 40 slices shown (1 of 2)]
[im 1/40]
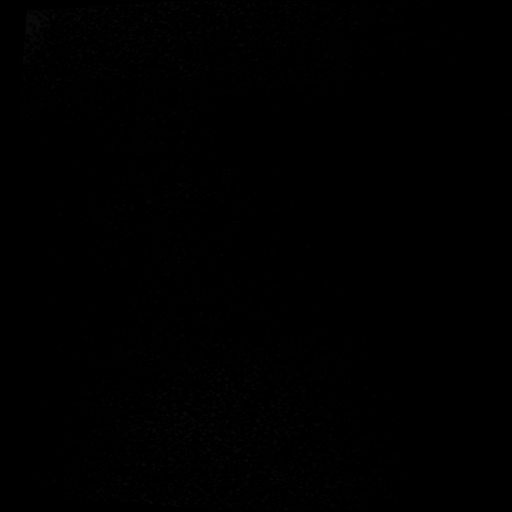
[im 4/40]
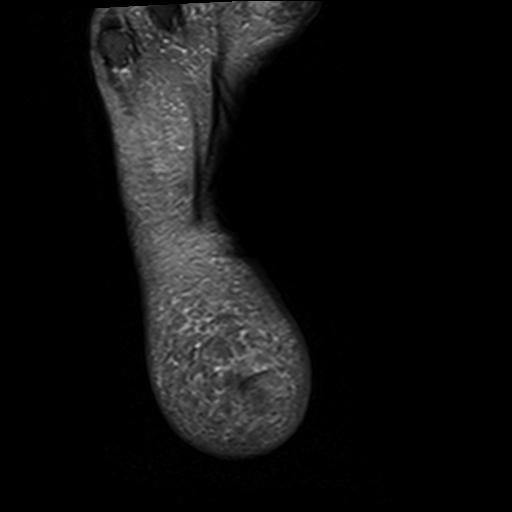
[im 8/40]
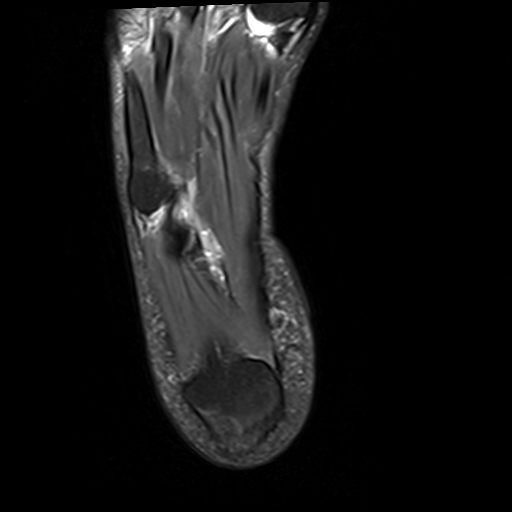
[im 12/40]
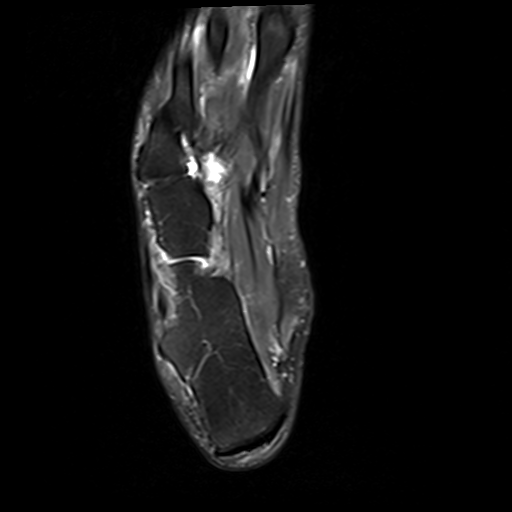
[im 16/40]
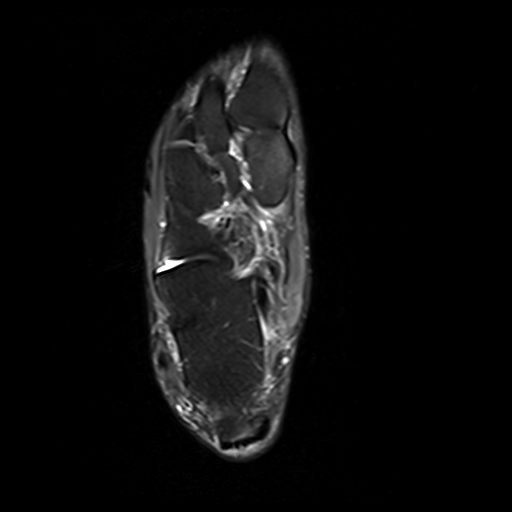
[im 20/40]
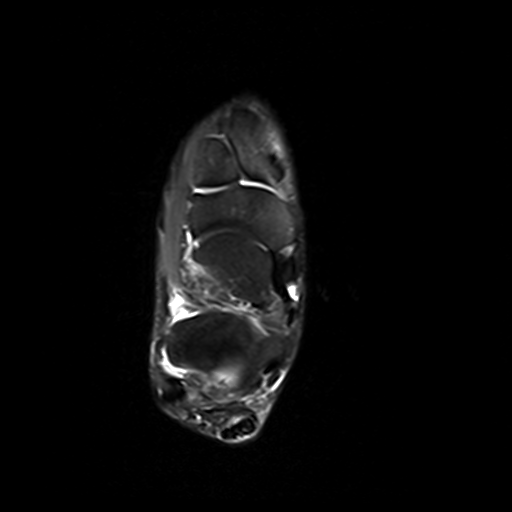
[im 24/40]
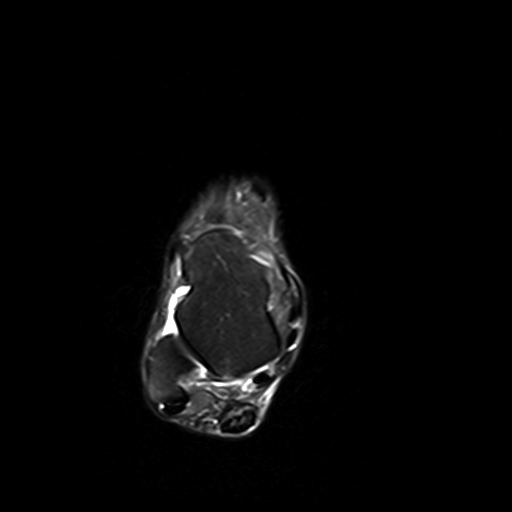
[im 28/40]
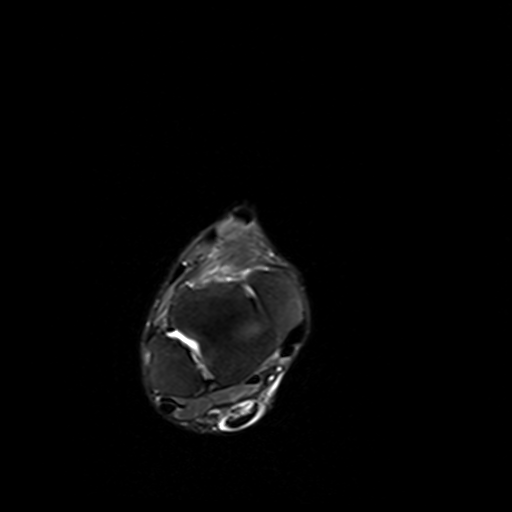
[im 36/40]
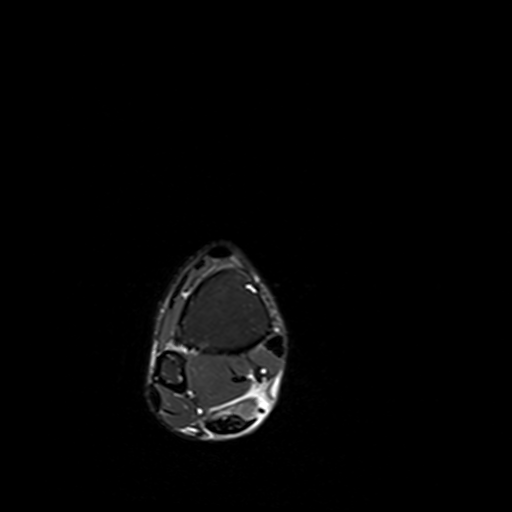

[Series 5: T1 · axial · 3.5mm · 0.31mm/px · z∈[-94,+26]mm · 3 of 40 slices shown (1 of 2)]
[im 5/40]
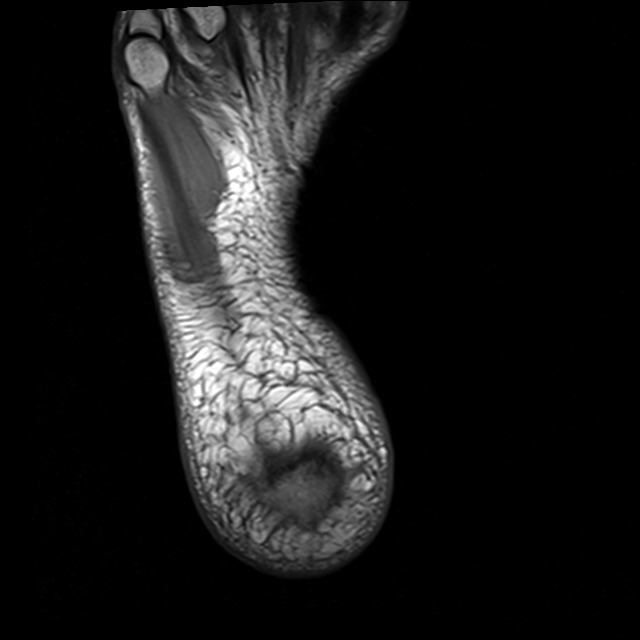
[im 22/40]
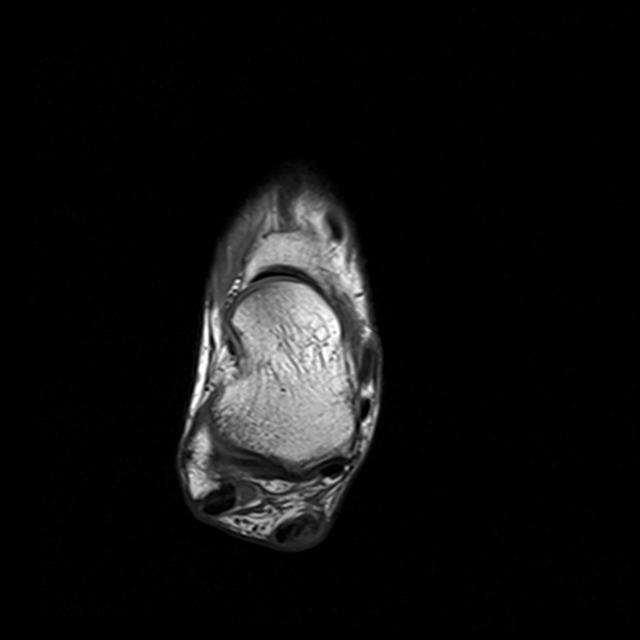
[im 35/40]
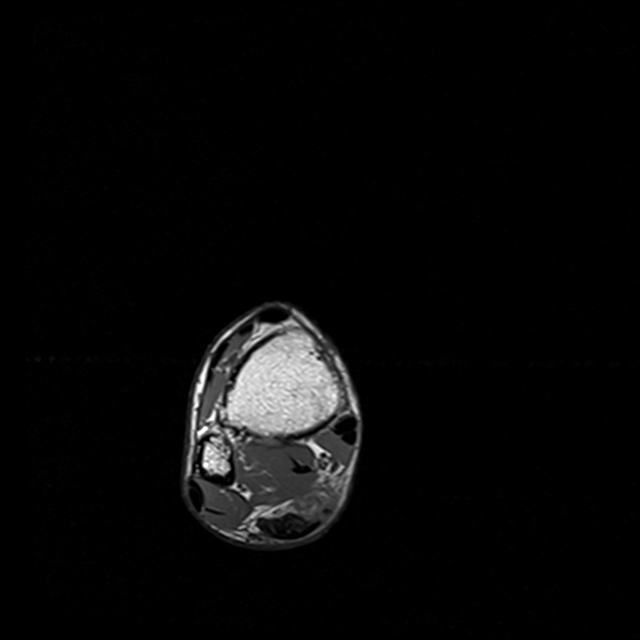

[Series 6: T1 · sagittal · 3.5mm · 0.35mm/px · 3 of 22 slices shown (2 of 2)]
[im 1/22]
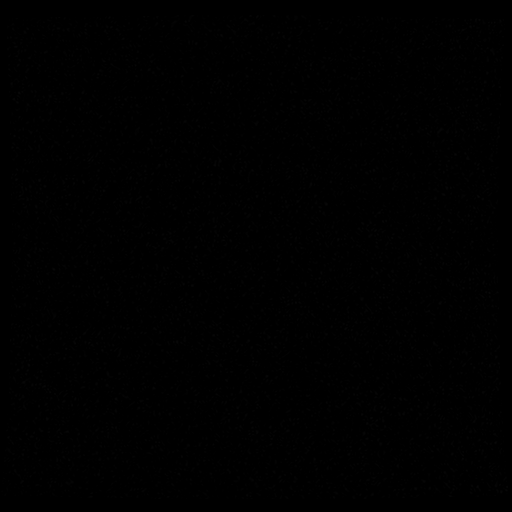
[im 11/22]
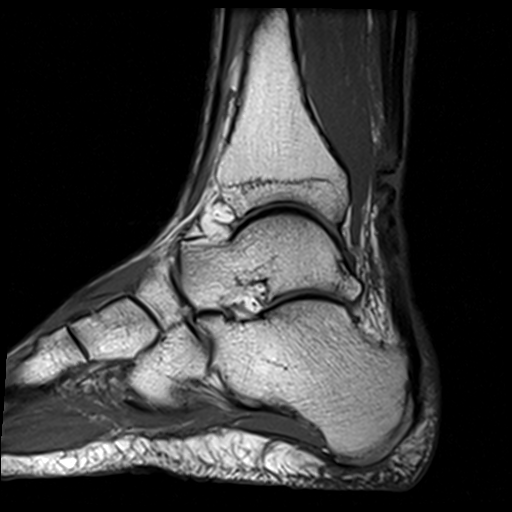
[im 22/22]
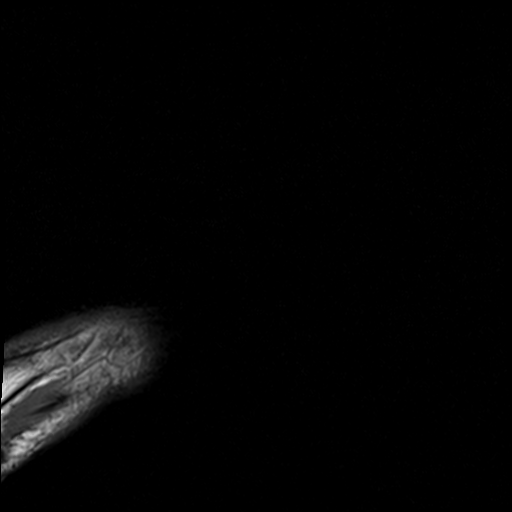

[Series 8: T2 fat-sat · coronal · 3.3mm · 0.37mm/px · 3 of 37 slices shown (2 of 2)]
[im 5/37]
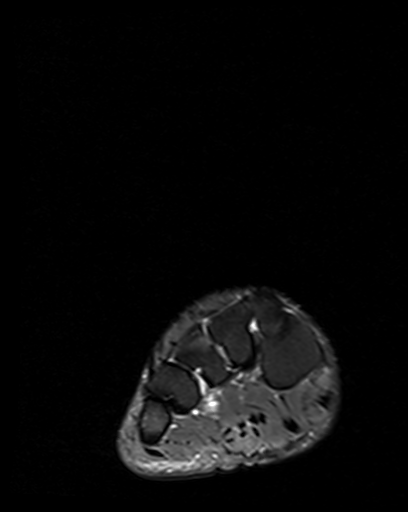
[im 19/37]
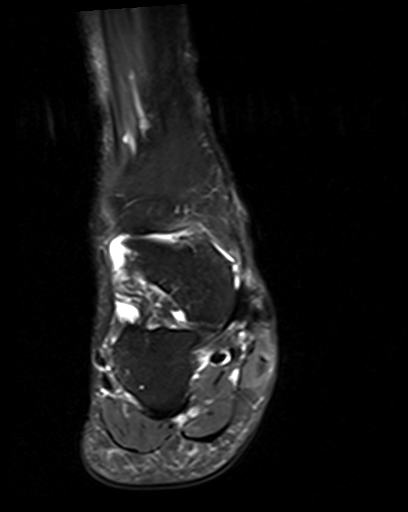
[im 32/37]
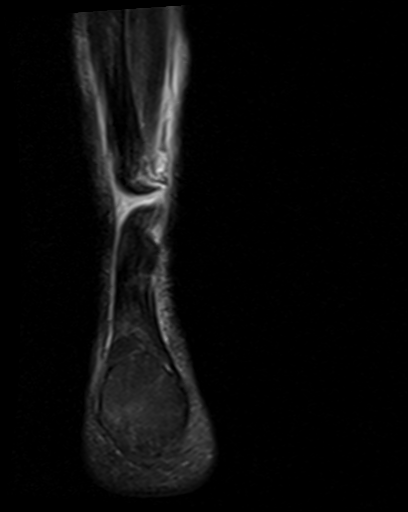

[18 of 40 positions shown; findings below may reference images not displayed]

FINDINGS: TENDONS

Peroneal: Intact.

Posteromedial: Intact

Anterior: Intact.

Achilles: The Achilles tendon is completely torn approximately
cm above its insertion on the calcaneus. There is no notable gap
between tendon fragments. The proximal fragment has a markedly lax
configuration. There is mild tendinosis present with slight
thickening of the tendon and mild intrasubstance increased T2
signal.

Plantar Fascia: Intact.

LIGAMENTS

Lateral: Intact.

Medial: Intact.

CARTILAGE

Ankle Joint: Negative.

Subtalar Joints/Sinus Tarsi:  Negative.

Bones: No fracture or worrisome lesion.

Other: None.
IMPRESSION: Complete Achilles tendon tear as described above.

These results were called by telephone at the time of interpretation
on 11/15/2016 at [DATE] to Samiyah Truong at the ordering
clinician's office. , who verbally acknowledged these results.

## 2018-02-20 ENCOUNTER — Encounter: Payer: Self-pay | Admitting: Diagnostic Neuroimaging

## 2018-02-20 ENCOUNTER — Ambulatory Visit (INDEPENDENT_AMBULATORY_CARE_PROVIDER_SITE_OTHER): Payer: Medicare Other | Admitting: Diagnostic Neuroimaging

## 2018-02-20 DIAGNOSIS — E1142 Type 2 diabetes mellitus with diabetic polyneuropathy: Secondary | ICD-10-CM

## 2018-02-20 MED ORDER — GABAPENTIN 600 MG PO TABS: 900.0000 mg | ORAL_TABLET | Freq: Two times a day (BID) | ORAL | 4 refills | Status: DC

## 2018-02-20 NOTE — Progress Notes (Signed)
[facility]  PATIENT: Shannon Hodge DOB: 07-24-1946  REFERRING CLINICIAN:  HISTORY FROM: patient REASON FOR VISIT: follow up   HISTORICAL  CHIEF COMPLAINT:  Chief Complaint  Patient presents with  . Follow-up  . Peripheral Neuropathy    stable.  CT pain L side (negative).  Mid back issue?    HISTORY OF PRESENT ILLNESS:   UPDATE (02/20/18, VRP): Since last visit, doing about the same. Symptoms are stable. Severity is mild. No alleviating or aggravating factors. Tolerating gabapentin.  UPDATE 01/27/17: Since last visit, had right achilles tear on 11/15/16. Now healing and recovering. Overall pain in feet and legs are stable.  UPDATE 07/28/16: Since last visit, sxs are slightly worse. Night time cramping, pain and interrupted sleep.  UPDATE 11/23/15: Since last visit, neuropathy sxs are stable. Exercising more and doing well. Nighttime cramps.   UPDATE 05/20/15: Since last visit, tried PT and using gabapentin. Still with cramps, numbness and left hip pain.  PRIOR HPI (11/04/14): 72 year old right-handed male here for evaluation of low back pain, leg cramps, neuropathy. Patient reports some symptoms starting around age 8-24 years old, but more significant symptoms in the last 6-12 months. In his 48s, patient developed low back pain, rating left side, left leg, with mild left foot drop. Patient was diagnosed with some degenerative lumbar spine disease, possible left L5 radiculopathy, treated conservatively. Patient had lingering symptoms throughout his life. Occasionally he would have cramping, pain in his left leg. In 2000, patient diagnosed diabetes. Over past 1-2 years he has noticed some numbness and tingling in his toes and feet, mainly the bottom. He feels a thick, numb, dead sensation in his feet. No significant electrical, stinging, pins and needles pain in his feet. Patient started on gabapentin by PCP which has mildly helped his numbness symptoms. Now patient  having more problems with cramping in his left leg, hamstring region, calf, intermittently, mainly when he lays down or sits down for a long time. Moving, standing, stretching seems to help. He is fairly active at the gym several times per week, using upper body strength machines, weightlifting, treadmill walking.   REVIEW OF SYSTEMS: Full 14 system review of systems performed and negative: except for freq waking.    ALLERGIES: No Known Allergies  HOME MEDICATIONS: Outpatient Medications Prior to Visit  Medication Sig Dispense Refill  . acetaminophen (TYLENOL) 500 MG tablet Take 500 mg by mouth daily as needed for moderate pain or headache.    Marland Kitchen aspirin 325 MG tablet Take 81.25 mg by mouth daily.    . cholecalciferol (VITAMIN D) 1000 units tablet Take 1,000 Units by mouth daily.    . Cyanocobalamin (VITAMIN B-12 PO) Take by mouth daily.    . DULoxetine (CYMBALTA) 30 MG capsule take 1 capsule by mouth daily 90 capsule 2  . fenofibrate micronized (LOFIBRA) 134 MG capsule Take 134 mg by mouth daily before breakfast.   1  . gabapentin (NEURONTIN) 600 MG tablet Take 1.5 tablets (900 mg total) by mouth 2 (two) times daily. 270 tablet 4  . lisinopril (PRINIVIL,ZESTRIL) 20 MG tablet Take 20 mg by mouth daily.   1  . Magnesium Oxide (MAG-200) 200 MG TABS Take 400 mg by mouth daily.     . metFORMIN (GLUCOPHAGE-XR) 500 MG 24 hr tablet Take 1,500 mg by mouth daily with supper.   1  . pyridOXINE (VITAMIN B-6) 100 MG tablet Take 100 mg by mouth every evening.    . simvastatin (ZOCOR) 20 MG tablet Take  20 mg by mouth every evening.   1  . valACYclovir (VALTREX) 500 MG tablet Take 500 mg by mouth daily as needed (outbreak).   2   No facility-administered medications prior to visit.     PAST MEDICAL HISTORY: Past Medical History:  Diagnosis Date  . Diabetes ([place])    type 2  . Diabetic neuropathy ([facility])    both legs  . DJD (degenerative joint disease)    L 5  . Hypercholesteremia   .  Hyperlipidemia   . Hypertension   . Neuropathy   . Ruptured Achilles tendon due to trauma 10/2016    PAST SURGICAL HISTORY: Past Surgical History:  Procedure Laterality Date  . COLONOSCOPY WITH PROPOFOL N/A 11/01/2016   Procedure: COLONOSCOPY WITH PROPOFOL;  Surgeon: Garlan Fair, MD;  Location: [facility];  Service: Endoscopy;  Laterality: N/A;  . colonscopy     x 2  . ESOPHAGOGASTRODUODENOSCOPY (EGD) WITH PROPOFOL N/A 11/01/2016   Procedure: ESOPHAGOGASTRODUODENOSCOPY (EGD) WITH PROPOFOL;  Surgeon: Garlan Fair, MD;  Location: [facility];  Service: Endoscopy;  Laterality: N/A;  . TONSILLECTOMY      FAMILY HISTORY: Family History  Problem Relation Age of Onset  . Pancreatic cancer Mother   . Stroke Father   . Brain cancer Father   . Diabetes Father   . Healthy Daughter   . Healthy Son   . Diabetes Maternal Grandmother   . Diabetes Maternal Grandfather   . Diabetes Paternal Grandmother   . Diabetes Paternal Grandfather     SOCIAL HISTORY:  Social History   Socioeconomic History  . Marital status: Married    Spouse name: Diane   . Number of children: 3  . Years of education: post grad  . Highest education level: Not on file  Occupational History  . Occupation: retired Software engineer  . Financial resource strain: Not on file  . Food insecurity:    Worry: Not on file    Inability: Not on file  . Transportation needs:    Medical: Not on file    Non-medical: Not on file  Tobacco Use  . Smoking status: Never Smoker  . Smokeless tobacco: Never Used  Substance and Sexual Activity  . Alcohol use: No    Alcohol/week: 0.0 oz  . Drug use: No  . Sexual activity: Yes  Lifestyle  . Physical activity:    Days per week: Not on file    Minutes per session: Not on file  . Stress: Not on file  Relationships  . Social connections:    Talks on phone: Not on file    Gets together: Not on file    Attends religious service: Not on file    Active member of  club or organization: Not on file    Attends meetings of clubs or organizations: Not on file    Relationship status: Not on file  . Intimate partner violence:    Fear of current or ex partner: Not on file    Emotionally abused: Not on file    Physically abused: Not on file    Forced sexual activity: Not on file  Other Topics Concern  . Not on file  Social History Narrative   Lives with wife    Drinks 2 cups of coffee a day     PHYSICAL EXAM  GENERAL EXAM/CONSTITUTIONAL: Vitals:  Vitals:   02/20/18 0920  BP: 134/68  Pulse: 61  Weight: 176 lb (79.8 kg)  Height: 6' 0.25" (1.835  m)     Body mass index is 23.7 kg/m. Wt Readings from Last 3 Encounters:  02/20/18 176 lb (79.8 kg)  10/19/17 178 lb 12.8 oz (81.1 kg)  09/21/17 176 lb 6.4 oz (80 kg)     Patient is in no distress; well developed, nourished and groomed; neck is supple  CARDIOVASCULAR:  Examination of carotid arteries is normal; no carotid bruits  Regular rate and rhythm, no murmurs  Examination of peripheral vascular system by observation and palpation is normal  EYES:  Ophthalmoscopic exam of optic discs and posterior segments is normal; no papilledema or hemorrhages  No exam data present  MUSCULOSKELETAL:  Gait, strength, tone, movements noted in Neurologic exam below  NEUROLOGIC: MENTAL STATUS:  No flowsheet data found.  awake, alert, oriented to person, place and time  recent and remote memory intact  normal attention and concentration  language fluent, comprehension intact, naming intact,   fund of knowledge appropriate  CRANIAL NERVE:   2nd - no papilledema on fundoscopic exam  2nd, 3rd, 4th, 6th - pupils equal and reactive to light, visual fields full to confrontation, extraocular muscles intact, no nystagmus  5th - facial sensation symmetric  7th - facial strength symmetric  8th - hearing intact  9th - palate elevates symmetrically, uvula midline  11th - shoulder shrug  symmetric  12th - tongue protrusion midline  MOTOR:   normal bulk and tone, full strength in the BUE, BLE  MINIMAL POSTURAL TREMOR IN RIGHT UPPER EXT  SENSORY:   normal and symmetric to light touch, temperature; DECR VIBRATION  COORDINATION:   finger-nose-finger, fine finger movements normal  REFLEXES:   deep tendon reflexes TRACE and symmetric  GAIT/STATION:   narrow based gait      DIAGNOSTIC DATA (LABS, IMAGING, TESTING) - I reviewed patient records, labs, notes, testing and imaging myself where available.  Lab Results  Component Value Date   WBC 4.8 08/30/2017   HGB 13.1 (A) 08/30/2017   HCT 38 (A) 08/30/2017      Component Value Date/Time   NA 142 08/30/2017   K 4.2 08/30/2017   BUN 19 08/30/2017   CREATININE 1.2 08/30/2017   Lab Results  Component Value Date   CHOL 150 08/30/2017   HDL 62 08/30/2017   LDLCALC 71 08/30/2017   TRIG 105 08/30/2017   Lab Results  Component Value Date   HGBA1C 5.9 08/30/2017   Lab Results  Component Value Date   VITAMINB12 234 08/30/2017   No results found for: TSH   01/04/18 CT abd / pelvis [I reviewed images myself and agree with interpretation. There are degenerative changes throughout the lumbar spine.  Moderate by foraminal stenosis at L3-4 and L4-5.  Vacuum disc phenomenon noted. -VRP]     ASSESSMENT AND PLAN  72 y.o. year old male here with 30+ years of low back pain, radiating to left leg, with intermittent left leg cramps / pain.    Dx: left lumbar radiculopathy (L5) + diabetic neuropathy + muscle cramps (due to neuropathy and radiculopathy and restless leg syndrome)   PLAN:  I spent 25 minutes of face to face time with patient. Greater than 50% of time was spent in counseling and coordination of care with patient. This is necessary because patient's symptoms are not optimally controlled / improved. In summary we discussed: - Diagnostic results, impressions, or recommended diagnostic studies: CT  abd / pelvis personally reviewed with patient - Prognosis: fair - Risks and benefits of management (treatment) options: gabapentin  for neuropathy - Instructions for management (treatment) or follow-up: - Importance of compliance with treatment options:  - Risk factor reduction:  - Patient and family education:   NEUROPATHY (STABLE) - stable; continue gabapentin 900mg  twice a day - try stopping duloxetine (not that effective) - continue physical activities  LUMBAR RADICULOPATHY (STABLE) - stable; continue conservative mgmt  TREMOR (possible essential tremor) - monitor for now; may consider primidone or propranolol  Meds ordered this encounter  Medications  . gabapentin (NEURONTIN) 600 MG tablet    Sig: Take 1.5 tablets (900 mg total) by mouth 2 (two) times daily.    Dispense:  270 tablet    Refill:  4   Return in about 1 year (around 02/21/2019) for with NP.     Penni Bombard, MD 9/76/7341, 9:37 AM Certified in Neurology, Neurophysiology and [facility] [address], [place], [place] [address] [phone]

## 2018-02-23 ENCOUNTER — Other Ambulatory Visit: Payer: Self-pay

## 2018-02-23 ENCOUNTER — Ambulatory Visit: Payer: Self-pay | Admitting: *Deleted

## 2018-02-23 ENCOUNTER — Ambulatory Visit (INDEPENDENT_AMBULATORY_CARE_PROVIDER_SITE_OTHER): Payer: Medicare Other | Admitting: Family Medicine

## 2018-02-23 ENCOUNTER — Encounter: Payer: Self-pay | Admitting: Family Medicine

## 2018-02-23 VITALS — BP 132/76 | HR 78 | Temp 98.3°F | Ht 72.25 in | Wt 173.8 lb

## 2018-02-23 DIAGNOSIS — E538 Deficiency of other specified B group vitamins: Secondary | ICD-10-CM

## 2018-02-23 DIAGNOSIS — R310 Gross hematuria: Secondary | ICD-10-CM | POA: Diagnosis not present

## 2018-02-23 DIAGNOSIS — Z1159 Encounter for screening for other viral diseases: Secondary | ICD-10-CM | POA: Diagnosis not present

## 2018-02-23 LAB — POCT URINALYSIS DIPSTICK
Bilirubin, UA: NEGATIVE
Glucose, UA: NEGATIVE
Ketones, UA: NEGATIVE
LEUKOCYTES UA: NEGATIVE
NITRITE UA: NEGATIVE
PROTEIN UA: NEGATIVE
SPEC GRAV UA: 1.02 (ref 1.010–1.025)
Urobilinogen, UA: 0.2 E.U./dL
pH, UA: 6 (ref 5.0–8.0)

## 2018-02-23 NOTE — Progress Notes (Signed)
Subjective  CC:  Chief Complaint  Patient presents with  . Hematuria    spotting blood in urine on weds, clot     HPI: Shannon Hodge is a 72 y.o. male who presents to the office today to address the problems listed above in the chief complaint.  See CRM note; had one episode of small sting like blood piece in urine with yellow urine, painless and feels well. I reviewed Dr. Ralene Muskrat notes:  Had CT and cystoscopy significant for urethral stricture disease and incomplete bladder emptying with increased PVR and mildly enlarged prostate: no cause for gross hematuria identified.   He feels fine but wants to know whey he had blood.   Vit b12 anemia due for recheck    Assessment  1. Hematuria, gross   2. Vitamin B12 deficiency   3. Need for hepatitis C screening test      Plan   hematuria:  ? Source is urethra. Reassured. Check urine culture given h/o UTI and elevated PVR> he is on ASA therapy. Check coags. Fu with urology if persists.  Follow up: f/u as scheduled   Orders Placed This Encounter  Procedures  . Urine Culture  . CBC with Differential/Platelet  . Hepatitis C antibody  . Vitamin B12  . PTT  . Protime-INR  . POCT urinalysis dipstick   No orders of the defined types were placed in this encounter.     I reviewed the patients updated PMH, FH, and SocHx.    Patient Active Problem List   Diagnosis Date Noted  . Restless leg syndrome 09/21/2017    Priority: High  . Mixed hyperlipidemia 09/21/2017    Priority: High  . Controlled type 2 diabetes mellitus with diabetic polyneuropathy, without long-term current use of insulin ([place]) 09/01/2016    Priority: High  . Insomnia 02/22/2016    Priority: High  . Lumbar radiculopathy 11/04/2014    Priority: High  . Hearing loss 09/21/2017    Priority: Medium  . Muscle cramp, nocturnal 09/21/2017    Priority: Medium  . Foot drop, left 09/21/2017    Priority: Medium  . 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 Vitamin B12 deficiency 09/21/2017    Priority: Low  . Allergic rhinitis 02/22/2016    Priority: Low  . Genital herpes 02/22/2016    Priority: Low  . 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 acetaminophen (TYLENOL) 500 MG tablet Take 500 mg by mouth daily as needed for moderate pain or headache.  Marland Kitchen aspirin 325 MG tablet Take 81.25 mg by mouth daily.  . cholecalciferol (VITAMIN D) 1000 units tablet Take 1,000 Units by mouth daily.  . Cyanocobalamin (VITAMIN B-12 PO) Take by mouth daily.  . fenofibrate micronized (LOFIBRA) 134 MG capsule Take 134 mg by mouth daily before breakfast.   . gabapentin (NEURONTIN) 600 MG tablet Take 1.5 tablets (900 mg total) by mouth 2 (two) times daily.  Marland Kitchen lisinopril (PRINIVIL,ZESTRIL) 20 MG tablet Take 20 mg by mouth daily.   . Magnesium Oxide (MAG-200) 200 MG TABS Take 400 mg by mouth daily.   . metFORMIN (GLUCOPHAGE-XR) 500 MG 24 hr tablet Take 1,500 mg by mouth daily with supper.   . pyridOXINE (VITAMIN B-6) 100 MG tablet Take 100 mg by mouth every evening.  . simvastatin (ZOCOR) 20 MG tablet Take 20 mg by mouth every evening.   . valACYclovir (VALTREX) 500 MG tablet Take 500 mg by mouth daily as needed (  outbreak).   . [DISCONTINUED] DULoxetine (CYMBALTA) 30 MG capsule take 1 capsule by mouth daily    Allergies: Patient has No Known Allergies. Family History: Patient family history includes Brain cancer in his father; Diabetes in his father, maternal grandfather, maternal grandmother, paternal grandfather, and paternal grandmother; Healthy in his daughter and son; Pancreatic cancer in his mother; Stroke in his father. Social History:  Patient  reports that he has never smoked. He has never used smokeless tobacco. He reports that he does not drink alcohol or use drugs.  Review of Systems: Constitutional: Negative for fever malaise or anorexia Cardiovascular: negative for chest pain Respiratory: negative for SOB or persistent  cough Gastrointestinal: negative for abdominal pain  Objective  Vitals: BP 132/76   Pulse 78   Temp 98.3 F (36.8 C)   Ht 6' 0.25" (1.835 m)   Wt 173 lb 12.8 oz (78.8 kg)   SpO2 98%   BMI 23.41 kg/m  General: no acute distress , A&Ox3 HEENT: PEERL, conjunctiva normal, Oropharynx moist,neck is supple Cardiovascular:  RRR without murmur or gallop.  Respiratory:  Good breath sounds bilaterally, CTAB with normal respiratory effort Nl abdominal exam w/o suprapubic ttp, no flank ttp Skin:  Warm, no rashes  Office Visit on 02/23/2018  Component Date Value Ref Range Status  . Glucose, UA 02/23/2018 Negative  Negative Final  . Bilirubin, UA 02/23/2018 Neg   Final  . Ketones, UA 02/23/2018 neg   Final  . Spec Grav, UA 02/23/2018 1.020  1.010 - 1.025 Final  . Blood, UA 02/23/2018 trace   Final  . pH, UA 02/23/2018 6.0  5.0 - 8.0 Final  . Protein, UA 02/23/2018 Negative  Negative Final  . Urobilinogen, UA 02/23/2018 0.2  0.2 or 1.0 E.U./dL Final  . Nitrite, UA 02/23/2018 neg   Final  . Leukocytes, UA 02/23/2018 Negative  Negative Final      Commons side effects, risks, benefits, and alternatives for medications and treatment plan prescribed today were discussed, and the patient expressed understanding of the given instructions. Patient is instructed to call or message via MyChart if he/she has any questions or concerns regarding our treatment plan. No barriers to understanding were identified. We discussed Red Flag symptoms and signs in detail. Patient expressed understanding regarding what to do in case of urgent or emergency type symptoms.   Medication list was reconciled, printed and provided to the patient in AVS. Patient instructions and summary information was reviewed with the patient as documented in the AVS. This note was prepared with assistance of Dragon voice recognition software. Occasional wrong-word or sound-a-like substitutions may have occurred due to the inherent  limitations of voice recognition software

## 2018-02-23 NOTE — Telephone Encounter (Signed)
FYI

## 2018-02-23 NOTE — Patient Instructions (Signed)
Please return in August as scheduled.   If you have any questions or concerns, please don't hesitate to send me a message via MyChart or call the office at [phone]. Thank you for visiting with [place] today! It's our pleasure caring for you.

## 2018-02-23 NOTE — Telephone Encounter (Signed)
Pt called with complaints of spotting of blood in urine starting yesterday; he states that he noticed a clot in his underwear and that he has seen urology; nurse triage initiated and recommendations made per protocol to include seeing a physician within 24 hours; pt offered and accepted appointment with Dr Jonni Sanger, [facility], today at 1500; he verbalizes understanding; will route to office for notification of this upcoming appointment.  Reason for Disposition . Blood in urine  (Exception: could be normal menstrual bleeding)  Answer Assessment - Initial Assessment Questions 1. COLOR of URINE: "Describe the color of the urine."  (e.g., tea-colored, pink, red, blood clots, bloody)     Trace of clot in underwear 2. ONSET: "When did the bleeding start?"      02/22/18 3. EPISODES: "How many times has there been blood in the urine?" or "How many times today?"    0 4. PAIN with URINATION: "Is there any pain with passing your urine?" If so, ask: "How bad is the pain?"  (Scale 1-10; or mild, moderate, severe)    - MILD - complains slightly about urination hurting    - MODERATE - interferes with normal activities      - SEVERE - excruciating, unwilling or unable to urinate because of the pain      no 5. FEVER: "Do you have a fever?" If so, ask: "What is your temperature, how was it measured, and when did it start?"     no 6. ASSOCIATED SYMPTOMS: "Are you passing urine more frequently than usual?"     Tired as the day wears on; taking naps 7. OTHER SYMPTOMS: "Do you have any other symptoms?" (e.g., back/flank pain, abdominal pain, vomiting)     no 8. PREGNANCY: "Is there any chance you are pregnant?" "When was your last menstrual period?" n/a  Protocols used: URINE - BLOOD IN-A-AH

## 2018-02-24 LAB — HEPATITIS C ANTIBODY
HEP C AB: NONREACTIVE
SIGNAL TO CUT-OFF: 0.01 (ref <1.00)

## 2018-02-24 LAB — APTT: APTT: 25 s (ref 22–34)

## 2018-02-24 LAB — PROTIME-INR
INR: 1
Prothrombin Time: 10.9 s (ref 9.0–11.5)

## 2018-02-24 LAB — URINE CULTURE
MICRO NUMBER:: 90888512
RESULT: NO GROWTH
SPECIMEN QUALITY:: ADEQUATE

## 2018-02-24 LAB — EXTRA LAV TOP TUBE

## 2018-02-24 LAB — VITAMIN B12: VITAMIN B 12: 941 pg/mL (ref 200–1100)

## 2018-03-06 DIAGNOSIS — H25013 Cortical age-related cataract, bilateral: Secondary | ICD-10-CM | POA: Diagnosis not present

## 2018-03-06 DIAGNOSIS — H02839 Dermatochalasis of unspecified eye, unspecified eyelid: Secondary | ICD-10-CM | POA: Diagnosis not present

## 2018-03-06 DIAGNOSIS — H40013 Open angle with borderline findings, low risk, bilateral: Secondary | ICD-10-CM | POA: Diagnosis not present

## 2018-03-06 DIAGNOSIS — H2513 Age-related nuclear cataract, bilateral: Secondary | ICD-10-CM | POA: Diagnosis not present

## 2018-03-16 DIAGNOSIS — H2511 Age-related nuclear cataract, right eye: Secondary | ICD-10-CM | POA: Diagnosis not present

## 2018-03-16 DIAGNOSIS — H2513 Age-related nuclear cataract, bilateral: Secondary | ICD-10-CM | POA: Diagnosis not present

## 2018-03-20 ENCOUNTER — Ambulatory Visit: Payer: Medicare Other | Admitting: Family Medicine

## 2018-03-27 ENCOUNTER — Encounter: Payer: Self-pay | Admitting: Family Medicine

## 2018-03-27 ENCOUNTER — Ambulatory Visit (INDEPENDENT_AMBULATORY_CARE_PROVIDER_SITE_OTHER): Payer: Medicare Other | Admitting: Family Medicine

## 2018-03-27 ENCOUNTER — Other Ambulatory Visit: Payer: Self-pay

## 2018-03-27 VITALS — BP 122/80 | HR 58 | Temp 97.6°F | Ht 72.25 in | Wt 177.6 lb

## 2018-03-27 DIAGNOSIS — E782 Mixed hyperlipidemia: Secondary | ICD-10-CM

## 2018-03-27 DIAGNOSIS — G2581 Restless legs syndrome: Secondary | ICD-10-CM | POA: Diagnosis not present

## 2018-03-27 DIAGNOSIS — M5416 Radiculopathy, lumbar region: Secondary | ICD-10-CM

## 2018-03-27 DIAGNOSIS — E1142 Type 2 diabetes mellitus with diabetic polyneuropathy: Secondary | ICD-10-CM

## 2018-03-27 DIAGNOSIS — F5101 Primary insomnia: Secondary | ICD-10-CM | POA: Diagnosis not present

## 2018-03-27 DIAGNOSIS — E538 Deficiency of other specified B group vitamins: Secondary | ICD-10-CM

## 2018-03-27 LAB — COMPREHENSIVE METABOLIC PANEL
ALBUMIN: 4.8 g/dL (ref 3.5–5.2)
ALT: 22 U/L (ref 0–53)
AST: 20 U/L (ref 0–37)
Alkaline Phosphatase: 44 U/L (ref 39–117)
BILIRUBIN TOTAL: 0.8 mg/dL (ref 0.2–1.2)
BUN: 23 mg/dL (ref 6–23)
CALCIUM: 10.2 mg/dL (ref 8.4–10.5)
CO2: 34 mEq/L — ABNORMAL HIGH (ref 19–32)
CREATININE: 1.22 mg/dL (ref 0.40–1.50)
Chloride: 101 mEq/L (ref 96–112)
GFR: 62.47 mL/min (ref >60.00)
Glucose, Bld: 115 mg/dL — ABNORMAL HIGH (ref 70–99)
Potassium: 4.4 mEq/L (ref 3.5–5.1)
Sodium: 142 mEq/L (ref 135–145)
Total Protein: 7.2 g/dL (ref 6.0–8.3)

## 2018-03-27 LAB — CBC WITH DIFFERENTIAL/PLATELET
BASOS ABS: 0 10*3/uL (ref 0.0–0.1)
Basophils Relative: 0.7 % (ref 0.0–3.0)
EOS ABS: 0.2 10*3/uL (ref 0.0–0.7)
Eosinophils Relative: 3.2 % (ref 0.0–5.0)
HCT: 39.3 % (ref 39.0–52.0)
Hemoglobin: 13.5 g/dL (ref 13.0–17.0)
Lymphocytes Relative: 26 % (ref 12.0–46.0)
Lymphs Abs: 1.3 10*3/uL (ref 0.7–4.0)
MCHC: 34.3 g/dL (ref 30.0–36.0)
MCV: 92.6 fl (ref 78.0–100.0)
MONO ABS: 0.4 10*3/uL (ref 0.1–1.0)
MONOS PCT: 7.1 % (ref 3.0–12.0)
NEUTROS ABS: 3.2 10*3/uL (ref 1.4–7.7)
Neutrophils Relative %: 63 % (ref 43.0–77.0)
PLATELETS: 175 10*3/uL (ref 150.0–400.0)
RBC: 4.25 Mil/uL (ref 4.22–5.81)
RDW: 12.7 % (ref 11.5–15.5)
WBC: 5 10*3/uL (ref 4.0–10.5)

## 2018-03-27 LAB — POCT GLYCOSYLATED HEMOGLOBIN (HGB A1C): HEMOGLOBIN A1C: 6.4 % — AB (ref 4.0–5.6)

## 2018-03-27 LAB — TSH: TSH: 1.42 u[IU]/mL (ref 0.35–4.50)

## 2018-03-27 LAB — LIPID PANEL
CHOLESTEROL: 169 mg/dL (ref 0–200)
HDL: 62.2 mg/dL (ref >39.00)
LDL Cholesterol: 92 mg/dL (ref 0–99)
NonHDL: 106.92
TRIGLYCERIDES: 76 mg/dL (ref 0.0–149.0)
Total CHOL/HDL Ratio: 3
VLDL: 15.2 mg/dL (ref 0.0–40.0)

## 2018-03-27 LAB — MICROALBUMIN / CREATININE URINE RATIO
CREATININE, U: 32.4 mg/dL
MICROALB/CREAT RATIO: 2.2 mg/g (ref 0.0–30.0)

## 2018-03-27 NOTE — Progress Notes (Signed)
Subjective  CC:  Chief Complaint  Patient presents with  . Diabetes    doing well, no complaints, last a1c 10/19/2017    HPI: Shannon Hodge is a 72 y.o. male who presents to the office today for follow up of diabetes and problems listed above in the chief complaint. Here for f/u medical problems/medicare cpe.   Diabetes follow up: His diabetic control is reported as Unchanged. Feels well. Eats well. Exercises regularly. No sxs of hyperglycemia.  He denies exertional CP or SOB or symptomatic hypoglycemia. He denies foot sores ; has stable peripheral neuropathy on gabapentin managed by neuro.   RLS: stable. To check iron today  Lipids: recheck. On statin. Well tolerated.   Back pain persists; trying accupuncture.   Check for anemia;vit B12 levels on oral supplements  Assessment  1. Controlled type 2 diabetes mellitus with diabetic polyneuropathy, without long-term current use of insulin ([place])   2. Restless leg syndrome   3. Mixed hyperlipidemia   4. Primary insomnia   5. Lumbar radiculopathy   6. Vitamin B12 deficiency      Plan   Diabetes is currently well controlled. Continue current metformin and diet. Recheck 6 months. Return for HD flu vaccine soon. Other imms up to date. Check Check renal function and electrolytes.  Lipids: fasting today on statin. Check liver  Check iron panel  No change in meds today.  Due for AWV.   Follow up: Return in about 6 months (around 09/27/2018) for follow up Diabetes.. Orders Placed This Encounter  Procedures  . CBC with Differential/Platelet  . Comprehensive metabolic panel  . Lipid panel  . Anemia Profile B  . TSH  . Microalbumin / creatinine urine ratio  . POCT glycosylated hemoglobin (Hb A1C)   No orders of the defined types were placed in this encounter.     Immunization History  Administered Date(s) Administered  . Influenza, High Dose Seasonal PF 05/09/2016, 05/04/2017  . Influenza-Unspecified 05/01/2015  .  Pneumococcal Conjugate-13 10/03/2016  . Pneumococcal Polysaccharide-23 01/27/2011, 09/21/2017  . Tdap 01/27/2011  . Zoster 01/19/2010    Diabetes Related Lab Review: Lab Results  Component Value Date   HGBA1C 6.4 (A) 03/27/2018   HGBA1C 5.9 08/30/2017    No results found for: Derl Barrow Lab Results  Component Value Date   CREATININE 1.2 08/30/2017   BUN 19 08/30/2017   NA 142 08/30/2017   K 4.2 08/30/2017   Lab Results  Component Value Date   CHOL 150 08/30/2017   Lab Results  Component Value Date   HDL 62 08/30/2017   Lab Results  Component Value Date   LDLCALC 71 08/30/2017   Lab Results  Component Value Date   TRIG 105 08/30/2017   No results found for: CHOLHDL No results found for: LDLDIRECT The 10-year ASCVD risk score Mikey Bussing DC Jr., et al., 2013) is: 22.1%*   Values used to calculate the score:     Age: 74 years     Sex: Male     Is Non-Hispanic African American: No     Diabetic: Yes     Tobacco smoker: No     Systolic Blood Pressure: 034 mmHg     Is BP treated: No     HDL Cholesterol: 62 mg/dL*     Total Cholesterol: 150 mg/dL*     * - Cholesterol units were assumed for this score calculation I have reviewed the PMH, Fam and Soc history. Patient Active Problem List   Diagnosis Date  Noted  . Restless leg syndrome 09/21/2017    Priority: High  . Mixed hyperlipidemia 09/21/2017    Priority: High  . Controlled type 2 diabetes mellitus with diabetic polyneuropathy, without long-term current use of insulin ([place]) 09/01/2016    Priority: High  . Insomnia 02/22/2016    Priority: High  . Lumbar radiculopathy 11/04/2014    Priority: High  . Hearing loss 09/21/2017    Priority: Medium    Wears hearing aides   . Muscle cramp, nocturnal 09/21/2017    Priority: Medium  . Foot drop, left 09/21/2017    Priority: Medium  . 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 Vitamin B12 deficiency 09/21/2017    Priority: Low  . Allergic rhinitis  02/22/2016    Priority: Low  . Genital herpes 02/22/2016    Priority: Low  . 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 He has never used smokeless tobacco. He reports that he does not drink alcohol or use drugs.  Review of Systems: Ophthalmic: negative for eye pain, loss of vision or double vision Cardiovascular: negative for chest pain Respiratory: negative for SOB or persistent cough Gastrointestinal: negative for abdominal pain Genitourinary: negative for dysuria or gross hematuria MSK: negative for foot lesions Neurologic: negative for weakness or gait disturbance  Objective  Vitals: BP 122/80   Pulse (!) 58   Temp 97.6 F (36.4 C)   Ht 6' 0.25" (1.835 m)   Wt 177 lb 9.6 oz (80.6 kg)   BMI 23.92 kg/m  General: well appearing, no acute distress  Psych:  Alert and oriented, normal mood and affect HEENT:  Normocephalic, atraumatic, moist mucous membranes, supple neck  Cardiovascular:  Nl S1 and S2, RRR without murmur, gallop or rub. no edema Respiratory:  Good breath sounds bilaterally, CTAB with normal effort, no rales Gastrointestinal: normal BS, soft, nontender Skin:  Warm, no rashes Neurologic:   Mental status is normal. normal gait Foot exam: no erythema, pallor, or cyanosis visible nl proprioception and sensation to monofilament testing bilaterally except great toe on left, +2 distal pulses bilaterally    Diabetic education: ongoing education regarding chronic disease management for diabetes was given today. We continue to reinforce the ABC's of diabetic management: A1c (<7 or 8 dependent upon patient), tight blood pressure control, and cholesterol management with goal LDL < 100 minimally. We discuss diet strategies, exercise recommendations, medication options and possible side effects. At each visit, we review recommended immunizations and preventive care recommendations for diabetics and stress that good diabetic control can  prevent other problems. See below for this patient's data.    Commons side effects, risks, benefits, and alternatives for medications and treatment plan prescribed today were discussed, and the patient expressed understanding of the given instructions. Patient is instructed to call or message via MyChart if he/she has any questions or concerns regarding our treatment plan. No barriers to understanding were identified. We discussed Red Flag symptoms and signs in detail. Patient expressed understanding regarding what to do in case of urgent or emergency type symptoms.   Medication list was reconciled, printed and provided to the patient in AVS. Patient instructions and summary information was reviewed with the patient as documented in the AVS. This note was prepared with assistance of Dragon voice recognition software. Occasional wrong-word or sound-a-like substitutions may have occurred due to the inherent limitations of voice recognition software

## 2018-03-27 NOTE — Patient Instructions (Addendum)
Please return in 6 months for diabetes follow up.  AWV at patient's convenience.  I will release your lab results to you on your MyChart account with further instructions. Please reply with any questions.   Medicare recommends an Annual Wellness Visit for all patients. Please schedule this to be done with our Nurse Educator, Maudie Mercury. This is an informative "talk" visit; it's goals are to ensure that your health care needs are being met and to give you education regarding avoiding falls, ensuring you are not suffering from depression or problems with memory or thinking, and to educate you on Advance Care Planning. It helps me take good care of you!   If you have any questions or concerns, please don't hesitate to send me a message via MyChart or call the office at [phone]. Thank you for visiting with [place] today! It's our pleasure caring for you.  Please do these things to maintain good health!   Exercise at least 30-45 minutes a day,  4-5 days a week.   Eat a low-fat diet with lots of fruits and vegetables, up to 7-9 servings per day.  Drink plenty of water daily. Try to drink 8 8oz glasses per day.  Seatbelts can save your life. Always wear your seatbelt.  Place Smoke Detectors on every level of your home and check batteries every year.  Eye Doctor - have an eye exam every 1-2 years  Safe sex - use condoms to protect yourself from STDs if you could be exposed to these types of infections.  Avoid heavy alcohol use. If you drink, keep it to less than 2 drinks/day and not every day.  [place].  Choose someone you trust that could speak for you if you became unable to speak for yourself.  Depression is common in our stressful world.If you're feeling down or losing interest in things you normally enjoy, please come in for a visit.

## 2018-03-28 LAB — ANEMIA PROFILE B
%SAT: 25 % (calc) (ref 20–48)
ABS Retic: 46970 cells/uL (ref 25000–9000)
BASOS ABS: 41 {cells}/uL (ref 0–200)
Basophils Relative: 0.8 %
EOS PCT: 3.7 %
Eosinophils Absolute: 189 cells/uL (ref 15–500)
FERRITIN: 31 ng/mL (ref 24–380)
Folate: 23.3 ng/mL
HEMATOCRIT: 40.2 % (ref 38.5–50.0)
Hemoglobin: 13.5 g/dL (ref 13.2–17.1)
Iron: 103 ug/dL (ref 50–180)
LYMPHS ABS: 1367 {cells}/uL (ref 850–3900)
MCH: 31.8 pg (ref 27.0–33.0)
MCHC: 33.6 g/dL (ref 32.0–36.0)
MCV: 94.6 fL (ref 80.0–100.0)
MPV: 10.6 fL (ref 7.5–12.5)
Monocytes Relative: 7.8 %
Neutro Abs: 3106 cells/uL (ref 1500–7800)
Neutrophils Relative %: 60.9 %
Platelets: 179 10*3/uL (ref 140–400)
RBC: 4.25 10*6/uL (ref 4.20–5.80)
RDW: 12.1 % (ref 11.0–15.0)
Retic Ct Pct: 1.1 %
TIBC: 408 ug/dL (ref 250–425)
Total Lymphocyte: 26.8 %
VITAMIN B 12: 1008 pg/mL (ref 200–1100)
WBC mixed population: 398 cells/uL (ref 200–950)
WBC: 5.1 10*3/uL (ref 3.8–10.8)

## 2018-04-12 ENCOUNTER — Ambulatory Visit (INDEPENDENT_AMBULATORY_CARE_PROVIDER_SITE_OTHER): Payer: Medicare Other

## 2018-04-12 DIAGNOSIS — Z23 Encounter for immunization: Secondary | ICD-10-CM

## 2018-04-23 DIAGNOSIS — N35011 Post-traumatic bulbous urethral stricture: Secondary | ICD-10-CM | POA: Diagnosis not present

## 2018-04-23 DIAGNOSIS — R3914 Feeling of incomplete bladder emptying: Secondary | ICD-10-CM | POA: Diagnosis not present

## 2018-05-24 ENCOUNTER — Telehealth: Payer: Self-pay | Admitting: Diagnostic Neuroimaging

## 2018-05-24 NOTE — Telephone Encounter (Addendum)
Spoke with patient and informed him Dr Leta Baptist doesn't prescribe pain medications. Advised him he should talk to his PCP. This RN asked if he is taking any OTC. He stated he cannot take Ibuprofen; he "abused it" when he was younger and had a bleed. He takes Tylenol with fair relief, and he asked how much he can take. He stated he is fine during the day but needs something to help him sleep. This RN advised he discuss with his PCP the Tylenol dosing and suggested he may be able to take Tylenol PM.  He verbalized understanding, agreement to call PCP and appreciation for call.

## 2018-05-24 NOTE — Telephone Encounter (Signed)
Patient called in stating he is having back pain and it is really effecting his sleep. He doesn't have much of a problem during the day it really is at night. He didn't know if there was any type of pain medication that he could take that could help this.

## 2018-05-25 ENCOUNTER — Ambulatory Visit (INDEPENDENT_AMBULATORY_CARE_PROVIDER_SITE_OTHER): Payer: Medicare Other | Admitting: Family Medicine

## 2018-05-25 ENCOUNTER — Other Ambulatory Visit: Payer: Self-pay

## 2018-05-25 ENCOUNTER — Encounter: Payer: Self-pay | Admitting: Family Medicine

## 2018-05-25 VITALS — BP 144/90 | HR 66 | Temp 97.7°F | Ht 72.25 in | Wt 181.8 lb

## 2018-05-25 DIAGNOSIS — M545 Low back pain, unspecified: Secondary | ICD-10-CM

## 2018-05-25 DIAGNOSIS — G8929 Other chronic pain: Secondary | ICD-10-CM

## 2018-05-25 MED ORDER — TRAMADOL HCL 50 MG PO TABS: 50.0000 mg | ORAL_TABLET | Freq: Three times a day (TID) | ORAL | 0 refills | Status: DC | PRN

## 2018-05-25 NOTE — Patient Instructions (Signed)
Please follow up if symptoms do not improve or as needed.    If you have any questions or concerns, please don't hesitate to send me a message via MyChart or call the office at [phone]. Thank you for visiting with [place] today! It's our pleasure caring for you.  We will call you with information regarding your referral appointment. Orthopedics.  If you do not hear from [place] within the next 2 weeks, please let me know. It can take 1-2 weeks to get appointments set up with the specialists.

## 2018-05-25 NOTE — Progress Notes (Signed)
Subjective  CC:  Chief Complaint  Patient presents with  . Leg Pain    Pain in legs worse at night, wants to discuss Pain Management Options and discuss testing for Lymes Disease     HPI: Shannon Hodge is a 72 y.o. male who presents to the office today to address the problems listed above in the chief complaint.  Patient having trouble sleeping at night due to low back pain and leg pain.  He has a diagnosis of lumbar radiculopathy and peripheral neuropathy managed by neurology.  He reports neuropathy pain is well controlled.  He does well during the day.  He keeps very active and exercises regularly.  However at night low back pain is a problem.  He uses Tylenol without significant relief.  Has had x-rays but never has seen orthopedics.  No injuries.  He inquires about Lyme disease due to his multiple joint complaints.  No fevers or rash. Assessment  1. Chronic bilateral low back pain without sciatica      Plan   Low back pain: Refer to orthopedics to get their input regarding his low back pain symptoms.  Ultram in the interim for pain control  Peripheral neuropathy is well controlled on gabapentin.  Managed by neurology.  Follow up: Return if symptoms worsen or fail to improve.   Orders Placed This Encounter  Procedures  . Ambulatory referral to Orthopedic Surgery   Meds ordered this encounter  Medications  . traMADol (ULTRAM) 50 MG tablet    Sig: Take 1 tablet (50 mg total) by mouth every 8 (eight) hours as needed.    Dispense:  30 tablet    Refill:  0      I reviewed the patients updated PMH, FH, and SocHx.    Patient Active Problem List   Diagnosis Date Noted  . Restless leg syndrome 09/21/2017    Priority: High  . Mixed hyperlipidemia 09/21/2017    Priority: High  . Controlled type 2 diabetes mellitus with diabetic polyneuropathy, without long-term current use of insulin ([place]) 09/01/2016    Priority: High  . Insomnia 02/22/2016    Priority: High  . Lumbar  radiculopathy 11/04/2014    Priority: High  . Hearing loss 09/21/2017    Priority: Medium  . Muscle cramp, nocturnal 09/21/2017    Priority: Medium  . Foot drop, left 09/21/2017    Priority: Medium  . 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 Vitamin B12 deficiency 09/21/2017    Priority: Low  . Allergic rhinitis 02/22/2016    Priority: Low  . Genital herpes 02/22/2016    Priority: Low  . 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 acetaminophen (TYLENOL) 500 MG tablet Take 500 mg by mouth daily as needed for moderate pain or headache.  Marland Kitchen aspirin 325 MG tablet Take 81.25 mg by mouth daily.  . cholecalciferol (VITAMIN D) 1000 units tablet Take 1,000 Units by mouth daily.  . Cyanocobalamin (VITAMIN B-12 PO) Take by mouth daily.  . fenofibrate micronized (LOFIBRA) 134 MG capsule Take 134 mg by mouth daily before breakfast.   . gabapentin (NEURONTIN) 600 MG tablet Take 1.5 tablets (900 mg total) by mouth 2 (two) times daily.  Marland Kitchen lisinopril (PRINIVIL,ZESTRIL) 20 MG tablet Take 20 mg by mouth daily.   . Magnesium Oxide (MAG-200) 200 MG TABS Take 400 mg by mouth daily.   . metFORMIN (GLUCOPHAGE-XR) 500 MG 24 hr tablet Take 1,500 mg by mouth daily with supper.   Marland Kitchen  pyridOXINE (VITAMIN B-6) 100 MG tablet Take 100 mg by mouth every evening.  . simvastatin (ZOCOR) 20 MG tablet Take 20 mg by mouth every evening.   . valACYclovir (VALTREX) 500 MG tablet Take 500 mg by mouth daily as needed (outbreak).     Allergies: Patient has No Known Allergies. Family History: Patient family history includes Brain cancer in his father; Diabetes in his father, maternal grandfather, maternal grandmother, paternal grandfather, and paternal grandmother; Healthy in his daughter and son; Pancreatic cancer in his mother; Stroke in his father. Social History:  Patient  reports that he has never smoked. He has never used smokeless tobacco. He reports that he does not drink alcohol or use  drugs.  Review of Systems: Constitutional: Negative for fever malaise or anorexia Cardiovascular: negative for chest pain Respiratory: negative for SOB or persistent cough Gastrointestinal: negative for abdominal pain  Objective  Vitals: BP (!) 144/90   Pulse 66   Temp 97.7 F (36.5 C)   Ht 6' 0.25" (1.835 m)   Wt 181 lb 12.8 oz (82.5 kg)   SpO2 96%   BMI 24.49 kg/m  General: no acute distress , A&Ox3 Appears comfortable, normal gait    Commons side effects, risks, benefits, and alternatives for medications and treatment plan prescribed today were discussed, and the patient expressed understanding of the given instructions. Patient is instructed to call or message via MyChart if he/she has any questions or concerns regarding our treatment plan. No barriers to understanding were identified. We discussed Red Flag symptoms and signs in detail. Patient expressed understanding regarding what to do in case of urgent or emergency type symptoms.   Medication list was reconciled, printed and provided to the patient in AVS. Patient instructions and summary information was reviewed with the patient as documented in the AVS. This note was prepared with assistance of Dragon voice recognition software. Occasional wrong-word or sound-a-like substitutions may have occurred due to the inherent limitations of voice recognition software

## 2018-05-30 DIAGNOSIS — M48061 Spinal stenosis, lumbar region without neurogenic claudication: Secondary | ICD-10-CM | POA: Diagnosis not present

## 2018-06-04 ENCOUNTER — Other Ambulatory Visit: Payer: Self-pay | Admitting: Emergency Medicine

## 2018-06-04 MED ORDER — LISINOPRIL 20 MG PO TABS: 20.0000 mg | ORAL_TABLET | Freq: Every day | ORAL | 3 refills | Status: DC

## 2018-06-04 MED ORDER — SIMVASTATIN 20 MG PO TABS: 20.0000 mg | ORAL_TABLET | Freq: Every evening | ORAL | 3 refills | Status: DC

## 2018-06-05 ENCOUNTER — Other Ambulatory Visit: Payer: Self-pay | Admitting: Emergency Medicine

## 2018-06-05 MED ORDER — FENOFIBRATE MICRONIZED 134 MG PO CAPS: 134.0000 mg | ORAL_CAPSULE | Freq: Every day | ORAL | 3 refills | Status: DC

## 2018-06-05 MED ORDER — METFORMIN HCL ER 500 MG PO TB24: 1500.0000 mg | ORAL_TABLET | Freq: Every day | ORAL | 1 refills | Status: DC

## 2018-06-22 ENCOUNTER — Telehealth: Payer: Self-pay | Admitting: Family Medicine

## 2018-06-22 MED ORDER — ZOSTER VAC RECOMB ADJUVANTED 50 MCG/0.5ML IM SUSR: 0.5000 mL | Freq: Once | INTRAMUSCULAR | 0 refills | Status: AC

## 2018-06-22 NOTE — Telephone Encounter (Signed)
Pt wanted to know if he should have the 2 part shingles shot or is he up to date. Pt can be reached at the home # or we can send a message via mychart.

## 2018-06-22 NOTE — Telephone Encounter (Signed)
Yes, please print RX for him thanks.

## 2018-06-22 NOTE — Telephone Encounter (Signed)
Prescription printed and placed up front for patient to pick up. Patient informed to pick up.   Doloris Hall,  LPN

## 2018-06-22 NOTE — Telephone Encounter (Signed)
Patient is interested in getting Shingles vaccine, okay to fill prescription?  Doloris Hall,  LPN

## 2018-06-25 MED FILL — SHINGRIX 50 MCG SUS: 50 | 1 days supply | Qty: 1 | Fill #0

## 2018-06-27 DIAGNOSIS — M48061 Spinal stenosis, lumbar region without neurogenic claudication: Secondary | ICD-10-CM | POA: Diagnosis not present

## 2018-07-02 ENCOUNTER — Telehealth: Payer: Self-pay | Admitting: Adult Health

## 2018-07-02 NOTE — Telephone Encounter (Signed)
Patient called and states his neuropathy is acting up. He would like to speak with someone regarding his foot cramping. Patient thinks he may need to come in sooner and wants to know if he can see Jinny Blossom soon because his issues have really picked up quickly. Please call and advise.

## 2018-07-02 NOTE — Telephone Encounter (Signed)
appt made for tomorrow at 0800.

## 2018-07-02 NOTE — Telephone Encounter (Signed)
LMVM for pt that called and to return call concerning appt.  Had 800 am 07-03-18 and 2 available appt on 07-04-18 if he calls back.

## 2018-07-03 ENCOUNTER — Encounter: Payer: Self-pay | Admitting: Adult Health

## 2018-07-03 ENCOUNTER — Ambulatory Visit (INDEPENDENT_AMBULATORY_CARE_PROVIDER_SITE_OTHER): Payer: Medicare Other | Admitting: Adult Health

## 2018-07-03 VITALS — BP 118/59 | HR 59 | Ht 72.0 in | Wt 176.0 lb

## 2018-07-03 DIAGNOSIS — R252 Cramp and spasm: Secondary | ICD-10-CM

## 2018-07-03 DIAGNOSIS — E1142 Type 2 diabetes mellitus with diabetic polyneuropathy: Secondary | ICD-10-CM

## 2018-07-03 DIAGNOSIS — M5416 Radiculopathy, lumbar region: Secondary | ICD-10-CM | POA: Diagnosis not present

## 2018-07-03 NOTE — Progress Notes (Signed)
PATIENT: Shannon Hodge DOB: 01/25/1946  REASON FOR VISIT: follow up HISTORY FROM: patient  HISTORY OF PRESENT ILLNESS: Today 07/03/18:  Mr. Graveline is a 72 year old male with a history of diabetic neuropathy and lumbar radiculopathy.  He returns today for follow-up.  He states in regards to his neuropathy he is not having significant burning or tingling in the feet.  He states that his main complaint is he is waking up during the night with muscle cramps in the feet bilaterally.  He states that he has no trouble falling asleep but typically will wake up at least once with muscle cramps.  He states once he gets out of bed and walks around the cramps will resolve in less than 5 minutes.  He states that he has noticed that if he is very active during the day the cramps are more prevalent.  He states lately they have been occurring nightly.  Reports that he does not drink a lot of water.  He may drink 1 bottle of water a day but typically drinks tea and Diet Coke.  He did have cortisone injections with his orthopedist 6 weeks ago for the back.  He returns today for evaluation.   UPDATE (02/20/18, VRP): Since last visit, doing about the same. Symptoms are stable. Severity is mild. No alleviating or aggravating factors. Tolerating gabapentin.  UPDATE 01/27/17: Since last visit, had right achilles tear on 11/15/16. Now healing and recovering. Overall pain in feet and legs are stable.  UPDATE 07/28/16: Since last visit, sxs are slightly worse. Night time cramping, pain and interrupted sleep.  UPDATE 11/23/15: Since last visit, neuropathy sxs are stable. Exercising more and doing well. Nighttime cramps.   UPDATE 05/20/15: Since last visit, tried PT and using gabapentin. Still with cramps, numbness and left hip pain.  PRIOR HPI (11/04/14): 72 year old right-handed male here for evaluation of low back pain, leg cramps, neuropathy. Patient reports some symptoms starting around age 60-50 years old, but more  significant symptoms in the last 6-12 months. In his 40s, patient developed low back pain, rating left side, left leg, with mild left foot drop. Patient was diagnosed with some degenerative lumbar spine disease, possible left L5 radiculopathy, treated conservatively. Patient had lingering symptoms throughout his life. Occasionally he would have cramping, pain in his left leg. In 2000, patient diagnosed diabetes. Over past 1-2 years he has noticed some numbness and tingling in his toes and feet, mainly the bottom. He feels a thick, numb, dead sensation in his feet. No significant electrical, stinging, pins and needles pain in his feet. Patient started on gabapentin by PCP which has mildly helped his numbness symptoms. Now patient having more problems with cramping in his left leg, hamstring region, calf, intermittently, mainly when he lays down or sits down for a long time. Moving, standing, stretching seems to help. He is fairly active at the gym several times per week, using upper body strength machines, weightlifting, treadmill walking.  REVIEW OF SYSTEMS: Out of a complete 14 system review of symptoms, the patient complains only of the following symptoms, and all other reviewed systems are negative.  Back pain, muscle cramps  ALLERGIES: No Known Allergies  HOME MEDICATIONS: Outpatient Medications Prior to Visit  Medication Sig Dispense Refill  . acetaminophen (TYLENOL) 500 MG tablet Take 500 mg by mouth daily as needed for moderate pain or headache.    Marland Kitchen aspirin 325 MG tablet Take 162.5 mg by mouth daily.     . cholecalciferol (VITAMIN D)  1000 units tablet Take 1,000 Units by mouth daily.    . Cyanocobalamin (VITAMIN B-12 PO) Take by mouth daily.    . fenofibrate micronized (LOFIBRA) 134 MG capsule Take 1 capsule (134 mg total) by mouth daily before breakfast. 90 capsule 3  . gabapentin (NEURONTIN) 600 MG tablet Take 1.5 tablets (900 mg total) by mouth 2 (two) times daily. 270 tablet 4  .  lisinopril (PRINIVIL,ZESTRIL) 20 MG tablet Take 1 tablet (20 mg total) by mouth daily. 90 tablet 3  . Magnesium Oxide (MAG-200) 200 MG TABS Take 400 mg by mouth daily.     . metFORMIN (GLUCOPHAGE-XR) 500 MG 24 hr tablet Take 3 tablets (1,500 mg total) by mouth daily with supper. 270 tablet 1  . pyridOXINE (VITAMIN B-6) 100 MG tablet Take 100 mg by mouth every evening.    . simvastatin (ZOCOR) 20 MG tablet Take 1 tablet (20 mg total) by mouth every evening. 90 tablet 3  . valACYclovir (VALTREX) 500 MG tablet Take 500 mg by mouth daily as needed (outbreak).   2  . traMADol (ULTRAM) 50 MG tablet Take 1 tablet (50 mg total) by mouth every 8 (eight) hours as needed. 30 tablet 0   No facility-administered medications prior to visit.     PAST MEDICAL HISTORY: Past Medical History:  Diagnosis Date  . Diabetes ([place])    type 2  . Diabetic neuropathy ([facility])    both legs  . DJD (degenerative joint disease)    L 5  . Hypercholesteremia   . Hyperlipidemia   . Hypertension   . Neuropathy   . Ruptured Achilles tendon due to trauma 10/2016    PAST SURGICAL HISTORY: Past Surgical History:  Procedure Laterality Date  . COLONOSCOPY WITH PROPOFOL N/A 11/01/2016   Procedure: COLONOSCOPY WITH PROPOFOL;  Surgeon: Garlan Fair, MD;  Location: [facility];  Service: Endoscopy;  Laterality: N/A;  . colonscopy     x 2  . ESOPHAGOGASTRODUODENOSCOPY (EGD) WITH PROPOFOL N/A 11/01/2016   Procedure: ESOPHAGOGASTRODUODENOSCOPY (EGD) WITH PROPOFOL;  Surgeon: Garlan Fair, MD;  Location: [facility];  Service: Endoscopy;  Laterality: N/A;  . TONSILLECTOMY      FAMILY HISTORY: Family History  Problem Relation Age of Onset  . Pancreatic cancer Mother   . Stroke Father   . Brain cancer Father   . Diabetes Father   . Healthy Daughter   . Healthy Son   . Diabetes Maternal Grandmother   . Diabetes Maternal Grandfather   . Diabetes Paternal Grandmother   . Diabetes Paternal Grandfather     SOCIAL  HISTORY: Social History   Socioeconomic History  . Marital status: Married    Spouse name: Diane   . Number of children: 3  . Years of education: post grad  . Highest education level: Not on file  Occupational History  . Occupation: retired Software engineer  . Financial resource strain: Not on file  . Food insecurity:    Worry: Not on file    Inability: Not on file  . Transportation needs:    Medical: Not on file    Non-medical: Not on file  Tobacco Use  . Smoking status: Never Smoker  . Smokeless tobacco: Never Used  Substance and Sexual Activity  . Alcohol use: No    Alcohol/week: 0.0 standard drinks  . Drug use: No  . Sexual activity: Yes  Lifestyle  . Physical activity:    Days per week: Not on file    Minutes  per session: Not on file  . Stress: Not on file  Relationships  . Social connections:    Talks on phone: Not on file    Gets together: Not on file    Attends religious service: Not on file    Active member of club or organization: Not on file    Attends meetings of clubs or organizations: Not on file    Relationship status: Not on file  . Intimate partner violence:    Fear of current or ex partner: Not on file    Emotionally abused: Not on file    Physically abused: Not on file    Forced sexual activity: Not on file  Other Topics Concern  . Not on file  Social History Narrative   Lives with wife    Drinks 2 cups of coffee a day      PHYSICAL EXAM  Vitals:   07/03/18 0801  BP: (!) 118/59  Pulse: (!) 59  Weight: 176 lb (79.8 kg)  Height: 6' (1.829 m)   Body mass index is 23.87 kg/m.  Generalized: Well developed, in no acute distress   Neurological examination  Mentation: Alert oriented to time, place, history taking. Follows all commands speech and language fluent Cranial nerve II-XII: Pupils were equal round reactive to light. Extraocular movements were full, visual field were full on confrontational test. Facial sensation and  strength were normal. Uvula tongue midline. Head turning and shoulder shrug  were normal and symmetric. Motor: The motor testing reveals 5 over 5 strength of all 4 extremities. Good symmetric motor tone is noted throughout.  Sensory: Sensory testing is intact to soft touch on all 4 extremities.  Pinprick and vibration sensation intact in all 4 extremities.  No evidence of extinction is noted.  Coordination: Cerebellar testing reveals good finger-nose-finger and heel-to-shin bilaterally.  Gait and station: Gait is normal. Reflexes: Deep tendon reflexes are symmetric and normal bilaterally.   DIAGNOSTIC DATA (LABS, IMAGING, TESTING) - I reviewed patient records, labs, notes, testing and imaging myself where available.  Lab Results  Component Value Date   WBC 5.0 03/27/2018   WBC 5.1 03/27/2018   HGB 13.5 03/27/2018   HGB 13.5 03/27/2018   HCT 39.3 03/27/2018   HCT 40.2 03/27/2018   MCV 92.6 03/27/2018   MCV 94.6 03/27/2018   PLT 175.0 03/27/2018   PLT 179 03/27/2018      Component Value Date/Time   NA 142 03/27/2018 0833   NA 142 08/30/2017   K 4.4 03/27/2018 0833   CL 101 03/27/2018 0833   CO2 34 (H) 03/27/2018 0833   GLUCOSE 115 (H) 03/27/2018 0833   BUN 23 03/27/2018 0833   BUN 19 08/30/2017   CREATININE 1.22 03/27/2018 0833   CALCIUM 10.2 03/27/2018 0833   PROT 7.2 03/27/2018 0833   ALBUMIN 4.8 03/27/2018 0833   AST 20 03/27/2018 0833   ALT 22 03/27/2018 0833   ALKPHOS 44 03/27/2018 0833   BILITOT 0.8 03/27/2018 0833   Lab Results  Component Value Date   CHOL 169 03/27/2018   HDL 62.20 03/27/2018   LDLCALC 92 03/27/2018   TRIG 76.0 03/27/2018   CHOLHDL 3 03/27/2018   Lab Results  Component Value Date   HGBA1C 6.4 (A) 03/27/2018   Lab Results  Component Value Date   VITAMINB12 1,008 03/27/2018   Lab Results  Component Value Date   TSH 1.42 03/27/2018      ASSESSMENT AND PLAN 72 y.o. year old male  has a  past medical history of Diabetes ([place]),  Diabetic neuropathy ([place]), DJD (degenerative joint disease), Hypercholesteremia, Hyperlipidemia, Hypertension, Neuropathy, and Ruptured Achilles tendon due to trauma (10/2016). here with:  1.  Diabetic neuropathy  2.  Lumbar radiculopathy  3.  Muscle cramps   The patient will continue on gabapentin 900 mg twice a day.  I have advised patient to stay well-hydrated and try stretching exercises before bedtime.  I reviewed some exercises with the patient.  If this does not improve leg cramps we may consider a small dose of a muscle relaxer at bedtime.  Patient voiced understanding.  He will follow-up in 6 months or sooner if needed.     Ward Givens, MSN, NP-C 07/03/2018, 8:08 AM [facility] [address] [place], [place] [address] [phone]

## 2018-07-03 NOTE — Patient Instructions (Signed)
Your Plan:  Continue gabapentin Increase water intake Try stretching before bedtime If your symptoms worsen or you develop new symptoms please let us know.   Thank you for coming to see [place] at [facility]. I hope we have been able to provide you high quality care today.  You may receive a patient satisfaction survey over the next few weeks. We would appreciate your feedback and comments so that we may continue to improve ourselves and the health of our patients.

## 2018-07-16 ENCOUNTER — Telehealth: Payer: Self-pay | Admitting: Adult Health

## 2018-07-16 DIAGNOSIS — R252 Cramp and spasm: Secondary | ICD-10-CM

## 2018-07-16 MED ORDER — CYCLOBENZAPRINE HCL 5 MG PO TABS: 5.0000 mg | ORAL_TABLET | Freq: Every day | ORAL | 1 refills | Status: DC

## 2018-07-16 NOTE — Telephone Encounter (Signed)
Pt requesting a call to discuss a muscle relaxer that NP had mentioned during his last office visit. Please advise

## 2018-07-16 NOTE — Telephone Encounter (Signed)
I have called in Flexeril 5 mg at bedtime.  Please advise the patient this medication can make and drowsy.  He may feel groggy the next morning.  I would also like to check muscle enzymes to make sure they are not abnormal.  Orders have been placed.  Please see if he can come in tomorrow or the next day to have blood work.

## 2018-07-16 NOTE — Telephone Encounter (Signed)
Spoke to pt and relayed that labs requested per MM/NP for muscle enzymes to make sure not abnormal.  Also flexeril 5mg  sent to pharmacy.  Can make droswsy/ groggy .  He verbalized understanding will come by tomorrow and have labs drawn.

## 2018-07-17 ENCOUNTER — Other Ambulatory Visit (INDEPENDENT_AMBULATORY_CARE_PROVIDER_SITE_OTHER): Payer: Self-pay

## 2018-07-17 DIAGNOSIS — R252 Cramp and spasm: Secondary | ICD-10-CM | POA: Diagnosis not present

## 2018-07-17 DIAGNOSIS — Z0289 Encounter for other administrative examinations: Secondary | ICD-10-CM

## 2018-07-18 LAB — CK TOTAL AND CKMB (NOT AT ARMC)
CK TOTAL: 143 U/L (ref 24–204)
CK-MB Index: 4.1 ng/mL (ref 0.0–10.4)

## 2018-07-19 ENCOUNTER — Telehealth: Payer: Self-pay | Admitting: *Deleted

## 2018-07-19 NOTE — Telephone Encounter (Signed)
-----   Message from Ward Givens, NP sent at 07/18/2018  2:26 PM EST ----- Labs results are unremarkable. Please call patient with results.

## 2018-07-19 NOTE — Telephone Encounter (Signed)
Spoke to pt and relayed that labs normal.  (muscle enzymes).  He stated has only used flexeril for 2 days, has noted drowsiness, sleeps for 2-3 hours then awakens up feels extreme stiffness, grabbing around ankles/ toes, gets up and lies in lounge chair with heat and then falls back to sleep.  Will monitor and see how he does.  If sx persist despite flexeril will let us know close to refill time, other wise has 28 tabs left to see how he does. He verbalized understanding of lab results.

## 2018-08-02 NOTE — Telephone Encounter (Signed)
Pt's not had any relief. He is wanting to know what would be the next step.

## 2018-08-06 ENCOUNTER — Encounter: Payer: Self-pay | Admitting: *Deleted

## 2018-08-06 NOTE — Telephone Encounter (Signed)
We could try another muscle relaxer such as baclofen if he is interested.

## 2018-08-06 NOTE — Telephone Encounter (Signed)
Sent mychart message

## 2018-08-07 MED ORDER — BACLOFEN 10 MG PO TABS: 5.0000 mg | ORAL_TABLET | Freq: Every day | ORAL | 0 refills | Status: DC

## 2018-08-07 NOTE — Telephone Encounter (Signed)
Per [employer], he was ok to try baclofen.

## 2018-08-07 NOTE — Telephone Encounter (Signed)
Baclofen 5 mg at bedtime ordered. Stop Flexeril. Please call patient with update.

## 2018-08-07 NOTE — Telephone Encounter (Signed)
Spoke with patient and informed him of new Rx sent in. Advised he stop taking Flexeril and call for any questions or concerns. He verbalized understanding, appreciation.

## 2018-08-07 NOTE — Addendum Note (Signed)
Addended by: Trudie Buckler on: 08/07/2018 01:16 PM   Modules accepted: Orders

## 2018-08-08 ENCOUNTER — Telehealth: Payer: Self-pay | Admitting: *Deleted

## 2018-08-08 MED ORDER — RIVASTIGMINE 4.6 MG/24HR TD PT24: 4.6000 mg | MEDICATED_PATCH | Freq: Every day | TRANSDERMAL | 12 refills | Status: DC

## 2018-08-08 NOTE — Telephone Encounter (Signed)
LVM informing [facility] pharmacy to discontinue [place] Rx sent in today.  Discontinued Rx in his MAR.

## 2018-08-16 NOTE — Progress Notes (Signed)
I reviewed note and agree with plan.   Penni Bombard, MD

## 2018-09-04 NOTE — Progress Notes (Signed)
Subjective:   Shannon Hodge is a 73 y.o. male who presents for Medicare Annual/Subsequent preventive examination.  Review of Systems:  No ROS.  Medicare Wellness Visit. Additional risk factors are reflected in the social history.  Cardiac Risk Factors include: advanced age (>24men, >77 women);diabetes mellitus;dyslipidemia;male gender;family history of premature cardiovascular disease;hypertension   Sleep patterns: Sleeps 6-7 hours. Naps daily.  Home Safety/Smoke Alarms: Feels safe in home. Smoke alarms in place.  Living environment; residence and Firearm Safety: Lives with wife in 2 story home. Master bedroom on main level. Steps with rail at door.  Seat Belt Safety/Bike Helmet: Wears seat belt.    Male:   CCS-Colonoscopy 11/01/2016, normal. Recall 10 years.  PSA- No results found for: PSA      Objective:    Vitals: BP (!) 122/58 (BP Location: Left Arm, Patient Position: Sitting, Cuff Size: Normal)   Pulse (!) 56   Ht 6' (1.829 m)   Wt 181 lb (82.1 kg)   SpO2 98%   BMI 24.55 kg/m   Body mass index is 24.55 kg/m.  Advanced Directives 09/05/2018 11/01/2016 10/27/2016 11/23/2015 05/20/2015 11/05/2014  Does Patient Have a Medical Advance Directive? Yes Yes Yes Yes Yes No;Yes  Type of Paramedic of [place];Living will [place];Living will [place];Living will [place];Living will [place];Living will [place];Living will  Does patient want to make changes to medical advance directive? - - No - Patient declined - - Yes - information given  Copy of [place] in Chart? No - copy requested No - copy requested - - No - copy requested No - copy requested  Would patient like information on creating a medical advance directive? - - - - - Yes - Educational materials given    Tobacco Social History   Tobacco Use  Smoking Status Never Smoker    Smokeless Tobacco Never Used     Counseling given: Not Answered   Past Medical History:  Diagnosis Date  . Diabetes ([place])    type 2  . Diabetic neuropathy (HCC)    both legs  . DJD (degenerative joint disease)    L 5  . Hypercholesteremia   . Hyperlipidemia   . Hypertension   . Neuropathy   . Ruptured Achilles tendon due to trauma 10/2016   Past Surgical History:  Procedure Laterality Date  . COLONOSCOPY WITH PROPOFOL N/A 11/01/2016   Procedure: COLONOSCOPY WITH PROPOFOL;  Surgeon: Garlan Fair, MD;  [facility];  Service: Endoscopy;  Laterality: N/A;  . colonscopy     x 2  . ESOPHAGOGASTRODUODENOSCOPY (EGD) WITH PROPOFOL N/A 11/01/2016   Procedure: ESOPHAGOGASTRODUODENOSCOPY (EGD) WITH PROPOFOL;  Surgeon: Garlan Fair, MD;  [facility];  Service: Endoscopy;  Laterality: N/A;  . TONSILLECTOMY     Family History  Problem Relation Age of Onset  . Pancreatic cancer Mother   . Stroke Father   . Brain cancer Father   . Diabetes Father   . Healthy Daughter   . Healthy Son   . Diabetes Maternal Grandmother   . Diabetes Maternal Grandfather   . Diabetes Paternal Grandmother   . Diabetes Paternal Grandfather    Social History   Socioeconomic History  . Marital status: Married    Spouse name: Diane   . Number of children: 3  . Years of education: post grad  . Highest education level: Not on file  Occupational History  .  Occupation: retired Software engineer  . Financial resource strain: Not on file  . Food insecurity:    Worry: Not on file    Inability: Not on file  . Transportation needs:    Medical: Not on file    Non-medical: Not on file  Tobacco Use  . Smoking status: Never Smoker  . Smokeless tobacco: Never Used  Substance and Sexual Activity  . Alcohol use: No    Alcohol/week: 0.0 standard drinks  . Drug use: No  . Sexual activity: Yes  Lifestyle  . Physical activity:    Days per week: Not on file    Minutes per  session: Not on file  . Stress: Not on file  Relationships  . Social connections:    Talks on phone: Not on file    Gets together: Not on file    Attends religious service: Not on file    Active member of club or organization: Not on file    Attends meetings of clubs or organizations: Not on file    Relationship status: Not on file  Other Topics Concern  . Not on file  Social History Narrative   Lives with wife    Drinks 2 cups of coffee a day    Outpatient Encounter Medications as of 09/05/2018  Medication Sig  . acetaminophen (TYLENOL) 500 MG tablet Take 500 mg by mouth daily as needed for moderate pain or headache.  Marland Kitchen aspirin 325 MG tablet Take 162.5 mg by mouth daily.   . cholecalciferol (VITAMIN D) 1000 units tablet Take 1,000 Units by mouth daily.  . Cyanocobalamin (VITAMIN B-12 PO) Take by mouth daily.  . fenofibrate micronized (LOFIBRA) 134 MG capsule Take 1 capsule (134 mg total) by mouth daily before breakfast.  . gabapentin (NEURONTIN) 600 MG tablet Take 1.5 tablets (900 mg total) by mouth 2 (two) times daily.  Marland Kitchen lisinopril (PRINIVIL,ZESTRIL) 20 MG tablet Take 1 tablet (20 mg total) by mouth daily.  . Magnesium Oxide (MAG-200) 200 MG TABS Take 400 mg by mouth daily.   . metFORMIN (GLUCOPHAGE-XR) 500 MG 24 hr tablet Take 3 tablets (1,500 mg total) by mouth daily with supper.  . pyridOXINE (VITAMIN B-6) 100 MG tablet Take 100 mg by mouth every evening.  . simvastatin (ZOCOR) 20 MG tablet Take 1 tablet (20 mg total) by mouth every evening.  . baclofen (LIORESAL) 10 MG tablet Take 0.5 tablets (5 mg total) by mouth at bedtime. (Patient not taking: Reported on 09/05/2018)  . valACYclovir (VALTREX) 500 MG tablet Take 500 mg by mouth daily as needed (outbreak).    No facility-administered encounter medications on file as of 09/05/2018.     Activities of Daily Living In your present state of health, do you have any difficulty performing the following activities: 09/05/2018 09/21/2017    Hearing? N Y  Comment - Hearing Aid  Vision? N Y  Comment - Cataracts  Difficulty concentrating or making decisions? N N  Walking or climbing stairs? N N  Dressing or bathing? N N  Doing errands, shopping? N N  Preparing Food and eating ? N -  Using the Toilet? N -  In the past six months, have you accidently leaked urine? N -  Do you have problems with loss of bowel control? N -  Managing your Medications? N -  Managing your Finances? N -  Housekeeping or managing your Housekeeping? N -  Some recent data might be hidden    Patient Care Team: Jonni Sanger,  Karie Fetch, MD as PCP - General (Family Medicine) Leta Baptist, Earlean Polka, MD as Consulting Physician (Neurology) Garlan Fair, MD as Consulting Physician (Gastroenterology) Irine Seal, MD as Attending Physician (Urology) Marchia Bond, MD as Consulting Physician (Orthopedic Surgery)   Assessment:   This is a routine wellness examination for Shannon Hodge.  Exercise Activities and Dietary recommendations Current Exercise Habits: Structured exercise class, Type of exercise: treadmill, Time (Minutes): 30, Frequency (Times/Week): 5, Weekly Exercise (Minutes/Week): 150, Exercise limited by: None identified   Diet (meal preparation, eat out, water intake, caffeinated beverages, dairy products, fruits and vegetables): Drinks water, diet Coke, unsweet tea and coffee.   Breakfast: whole wheat; soy milk; ham; coffee  Lunch: sandwich; frozen vegetables Dinner: protein and vegetables; salads  Goals    . Patient Stated     Maintain current health status staying active.        Fall Risk Fall Risk  09/05/2018 09/21/2017 11/23/2015 05/20/2015  Falls in the past year? 0 No No No    Depression Screen PHQ 2/9 Scores 09/05/2018 09/21/2017  PHQ - 2 Score 0 0  PHQ- 9 Score - 0    Cognitive Function MMSE - Mini Mental State Exam 09/05/2018  Orientation to time 5  Orientation to Place 5  Registration 3  Attention/ Calculation 5  Recall 3   Language- name 2 objects 2  Language- repeat 1  Language- follow 3 step command 3  Language- read & follow direction 1  Write a sentence 1  Copy design 1  Total score 30        Immunization History  Administered Date(s) Administered  . Influenza, High Dose Seasonal PF 05/09/2016, 05/04/2017, 04/12/2018  . Influenza-Unspecified 05/01/2015  . Pneumococcal Conjugate-13 10/03/2016  . Pneumococcal Polysaccharide-23 01/27/2011, 09/21/2017  . Tdap 01/27/2011  . Zoster 01/19/2010  . Zoster Recombinat (Shingrix) 06/25/2018     Screening Tests Health Maintenance  Topic Date Due  . HEMOGLOBIN A1C  09/27/2018  . OPHTHALMOLOGY EXAM  02/08/2019  . FOOT EXAM  03/28/2019  . TETANUS/TDAP  01/26/2021  . COLONOSCOPY  11/02/2026  . INFLUENZA VACCINE  Completed  . Hepatitis C Screening  Completed  . PNA vac Low Risk Adult  Completed        Plan:    Bring a copy of your living will and/or healthcare power of attorney to your next office visit.  Continue doing brain stimulating activities (puzzles, reading, adult coloring books, staying active) to keep memory sharp.   I have personally reviewed and noted the following in the patient's chart:   . Medical and social history . Use of alcohol, tobacco or illicit drugs  . Current medications and supplements . Functional ability and status . Nutritional status . Physical activity . Advanced directives . List of other physicians . Hospitalizations, surgeries, and ER visits in previous 12 months . Vitals . Screenings to include cognitive, depression, and falls . Referrals and appointments  In addition, I have reviewed and discussed with patient certain preventive protocols, quality metrics, and best practice recommendations. A written personalized care plan for preventive services as well as general preventive health recommendations were provided to patient.     Gerilyn Nestle, RN  09/05/2018  PCP Notes: -Pt recently received  Cortisone injections (back) at Ortho. -Right hand tremors more obvious. -F/U with PCP 09/14/2018.

## 2018-09-05 ENCOUNTER — Other Ambulatory Visit: Payer: Self-pay

## 2018-09-05 ENCOUNTER — Ambulatory Visit (INDEPENDENT_AMBULATORY_CARE_PROVIDER_SITE_OTHER): Payer: Medicare Other

## 2018-09-05 VITALS — BP 122/58 | HR 56 | Ht 72.0 in | Wt 181.0 lb

## 2018-09-05 DIAGNOSIS — Z0000 Encounter for general adult medical examination without abnormal findings: Secondary | ICD-10-CM | POA: Diagnosis not present

## 2018-09-05 NOTE — Progress Notes (Signed)
I have reviewed the documentation from the recent AWV done by [name]; I agree with the documentation and will follow up on any recommendations or abnormal findings as suggested.  

## 2018-09-05 NOTE — Patient Instructions (Addendum)
Bring a copy of your living will and/or healthcare power of attorney to your next office visit.  Continue doing brain stimulating activities (puzzles, reading, adult coloring books, staying active) to keep memory sharp.    Health Maintenance, Male A healthy lifestyle and preventive care is important for your health and wellness. Ask your health care provider about what schedule of regular examinations is right for you. What should I know about weight and diet? Eat a Healthy Diet  Eat plenty of vegetables, fruits, whole grains, low-fat dairy products, and lean protein.  Do not eat a lot of foods high in solid fats, added sugars, or salt.  Maintain a Healthy Weight Regular exercise can help you achieve or maintain a healthy weight. You should:  Do at least 150 minutes of exercise each week. The exercise should increase your heart rate and make you sweat (moderate-intensity exercise).  Do strength-training exercises at least twice a week. Watch Your Levels of Cholesterol and Blood Lipids  Have your blood tested for lipids and cholesterol every 5 years starting at 71 years of age. If you are at high risk for heart disease, you should start having your blood tested when you are 71 years old. You may need to have your cholesterol levels checked more often if: ? Your lipid or cholesterol levels are high. ? You are older than 71 years of age. ? You are at high risk for heart disease. What should I know about cancer screening? Many types of cancers can be detected early and may often be prevented. Lung Cancer  You should be screened every year for lung cancer if: ? You are a current smoker who has smoked for at least 30 years. ? You are a former smoker who has quit within the past 15 years.  Talk to your health care provider about your screening options, when you should start screening, and how often you should be screened. Colorectal Cancer  Routine colorectal cancer screening usually  begins at 71 years of age and should be repeated every 5-10 years until you are 71 years old. You may need to be screened more often if early forms of precancerous polyps or small growths are found. Your health care provider may recommend screening at an earlier age if you have risk factors for colon cancer.  Your health care provider may recommend using home test kits to check for hidden blood in the stool.  A small camera at the end of a tube can be used to examine your colon (sigmoidoscopy or colonoscopy). This checks for the earliest forms of colorectal cancer. Prostate and Testicular Cancer  Depending on your age and overall health, your health care provider may do certain tests to screen for prostate and testicular cancer.  Talk to your health care provider about any symptoms or concerns you have about testicular or prostate cancer. Skin Cancer  Check your skin from head to toe regularly.  Tell your health care provider about any new moles or changes in moles, especially if: ? There is a change in a mole's size, shape, or color. ? You have a mole that is larger than a pencil eraser.  Always use sunscreen. Apply sunscreen liberally and repeat throughout the day.  Protect yourself by wearing long sleeves, pants, a wide-brimmed hat, and sunglasses when outside. What should I know about heart disease, diabetes, and high blood pressure?  If you are 18-39 years of age, have your blood pressure checked every 3-5 years. If you are 40   years of age or older, have your blood pressure checked every year. You should have your blood pressure measured twice-once when you are at a hospital or clinic, and once when you are not at a hospital or clinic. Record the average of the two measurements. To check your blood pressure when you are not at a hospital or clinic, you can use: ? An automated blood pressure machine at a pharmacy. ? A home blood pressure monitor.  Talk to your health care provider  about your target blood pressure.  If you are between 45-79 years old, ask your health care provider if you should take aspirin to prevent heart disease.  Have regular diabetes screenings by checking your fasting blood sugar level. ? If you are at a normal weight and have a low risk for diabetes, have this test once every three years after the age of 45. ? If you are overweight and have a high risk for diabetes, consider being tested at a younger age or more often.  A one-time screening for abdominal aortic aneurysm (AAA) by ultrasound is recommended for men aged 65-75 years who are current or former smokers. What should I know about preventing infection? Hepatitis B If you have a higher risk for hepatitis B, you should be screened for this virus. Talk with your health care provider to find out if you are at risk for hepatitis B infection. Hepatitis C Blood testing is recommended for:  Everyone born from 1945 through 1965.  Anyone with known risk factors for hepatitis C. Sexually Transmitted Diseases (STDs)  You should be screened each year for STDs including gonorrhea and chlamydia if: ? You are sexually active and are younger than 71 years of age. ? You are older than 71 years of age and your health care provider tells you that you are at risk for this type of infection. ? Your sexual activity has changed since you were last screened and you are at an increased risk for chlamydia or gonorrhea. Ask your health care provider if you are at risk.  Talk with your health care provider about whether you are at high risk of being infected with HIV. Your health care provider may recommend a prescription medicine to help prevent HIV infection. What else can I do?  Schedule regular health, dental, and eye exams.  Stay current with your vaccines (immunizations).  Do not use any tobacco products, such as cigarettes, chewing tobacco, and e-cigarettes. If you need help quitting, ask your health  care provider.  Limit alcohol intake to no more than 2 drinks per day. One drink equals 12 ounces of beer, 5 ounces of wine, or 1 ounces of hard liquor.  Do not use street drugs.  Do not share needles.  Ask your health care provider for help if you need support or information about quitting drugs.  Tell your health care provider if you often feel depressed.  Tell your health care provider if you have ever been abused or do not feel safe at home. This information is not intended to replace advice given to you by your health care provider. Make sure you discuss any questions you have with your health care provider. Document Released: 01/14/2008 Document Revised: 03/16/2016 Document Reviewed: 04/21/2015 Elsevier Interactive Patient Education  2019 Elsevier Inc.  

## 2018-09-06 MED FILL — SHINGRIX 50 MCG SUS: 50 | 1 days supply | Qty: 1 | Fill #1

## 2018-09-14 ENCOUNTER — Other Ambulatory Visit: Payer: Self-pay

## 2018-09-14 ENCOUNTER — Encounter: Payer: Self-pay | Admitting: Family Medicine

## 2018-09-14 ENCOUNTER — Ambulatory Visit (INDEPENDENT_AMBULATORY_CARE_PROVIDER_SITE_OTHER): Payer: Medicare Other | Admitting: Family Medicine

## 2018-09-14 VITALS — BP 128/62 | HR 56 | Temp 97.8°F | Resp 16 | Ht 72.0 in | Wt 179.2 lb

## 2018-09-14 DIAGNOSIS — E782 Mixed hyperlipidemia: Secondary | ICD-10-CM | POA: Diagnosis not present

## 2018-09-14 DIAGNOSIS — E1142 Type 2 diabetes mellitus with diabetic polyneuropathy: Secondary | ICD-10-CM

## 2018-09-14 DIAGNOSIS — E538 Deficiency of other specified B group vitamins: Secondary | ICD-10-CM

## 2018-09-14 DIAGNOSIS — M5416 Radiculopathy, lumbar region: Secondary | ICD-10-CM | POA: Diagnosis not present

## 2018-09-14 LAB — CBC WITH DIFFERENTIAL/PLATELET
BASOS ABS: 0 10*3/uL (ref 0.0–0.1)
Basophils Relative: 0.7 % (ref 0.0–3.0)
Eosinophils Absolute: 0.2 10*3/uL (ref 0.0–0.7)
Eosinophils Relative: 4 % (ref 0.0–5.0)
HCT: 38.4 % — ABNORMAL LOW (ref 39.0–52.0)
Hemoglobin: 13.3 g/dL (ref 13.0–17.0)
LYMPHS PCT: 30.4 % (ref 12.0–46.0)
Lymphs Abs: 1.4 10*3/uL (ref 0.7–4.0)
MCHC: 34.6 g/dL (ref 30.0–36.0)
MCV: 92.3 fl (ref 78.0–100.0)
MONOS PCT: 9 % (ref 3.0–12.0)
Monocytes Absolute: 0.4 10*3/uL (ref 0.1–1.0)
Neutro Abs: 2.6 10*3/uL (ref 1.4–7.7)
Neutrophils Relative %: 55.9 % (ref 43.0–77.0)
Platelets: 211 10*3/uL (ref 150.0–400.0)
RBC: 4.17 Mil/uL — ABNORMAL LOW (ref 4.22–5.81)
RDW: 12.4 % (ref 11.5–15.5)
WBC: 4.6 10*3/uL (ref 4.0–10.5)

## 2018-09-14 LAB — POCT GLYCOSYLATED HEMOGLOBIN (HGB A1C): Hemoglobin A1C: 7.1 % — AB (ref 4.0–5.6)

## 2018-09-14 LAB — COMPREHENSIVE METABOLIC PANEL
ALT: 23 U/L (ref 0–53)
AST: 21 U/L (ref 0–37)
Albumin: 4.8 g/dL (ref 3.5–5.2)
Alkaline Phosphatase: 49 U/L (ref 39–117)
BILIRUBIN TOTAL: 0.9 mg/dL (ref 0.2–1.2)
BUN: 23 mg/dL (ref 6–23)
CO2: 32 meq/L (ref 19–32)
Calcium: 10 mg/dL (ref 8.4–10.5)
Chloride: 100 mEq/L (ref 96–112)
Creatinine, Ser: 1.21 mg/dL (ref 0.40–1.50)
GFR: 59.26 mL/min — AB (ref >60.00)
GLUCOSE: 117 mg/dL — AB (ref 70–99)
Potassium: 4.3 mEq/L (ref 3.5–5.1)
SODIUM: 140 meq/L (ref 135–145)
TOTAL PROTEIN: 6.9 g/dL (ref 6.0–8.3)

## 2018-09-14 LAB — LIPID PANEL
CHOL/HDL RATIO: 3
Cholesterol: 164 mg/dL (ref 0–200)
HDL: 51.6 mg/dL (ref >39.00)
LDL Cholesterol: 91 mg/dL (ref 0–99)
NONHDL: 112.64
Triglycerides: 106 mg/dL (ref 0.0–149.0)
VLDL: 21.2 mg/dL (ref 0.0–40.0)

## 2018-09-14 LAB — VITAMIN B12: VITAMIN B 12: 1285 pg/mL — AB (ref 211–911)

## 2018-09-14 MED ORDER — SIMVASTATIN 40 MG PO TABS: 40.0000 mg | ORAL_TABLET | Freq: Every day | ORAL | 3 refills | Status: DC

## 2018-09-14 MED ORDER — METFORMIN HCL ER 500 MG PO TB24: 1000.0000 mg | ORAL_TABLET | Freq: Two times a day (BID) | ORAL | 1 refills | Status: DC

## 2018-09-14 NOTE — Patient Instructions (Signed)
Please return in 3 months for diabetes follow up  Please increase your metformin to 2 tabs twice a day.   If you have any questions or concerns, please don't hesitate to send me a message via MyChart or call the office at [phone]. Thank you for visiting with [place] today! It's our pleasure caring for you.  Please do these things to maintain good health!   Exercise at least 30-45 minutes a day,  4-5 days a week.   Eat a low-fat diet with lots of fruits and vegetables, up to 7-9 servings per day.  Drink plenty of water daily. Try to drink 8 8oz glasses per day.  Seatbelts can save your life. Always wear your seatbelt.  Place Smoke Detectors on every level of your home and check batteries every year.  Eye Doctor - have an eye exam every 1-2 years  Safe sex - use condoms to protect yourself from STDs if you could be exposed to these types of infections.  Avoid heavy alcohol use. If you drink, keep it to less than 2 drinks/day and not every day.  [place].  Choose someone you trust that could speak for you if you became unable to speak for yourself.  Depression is common in our stressful world.If you're feeling down or losing interest in things you normally enjoy, please come in for a visit.

## 2018-09-14 NOTE — Addendum Note (Signed)
Addended by: Layla Barter on: 09/14/2018 04:25 PM   Modules accepted: Orders

## 2018-09-14 NOTE — Progress Notes (Signed)
Subjective  CC:  Chief Complaint  Patient presents with  . Annual Exam    Pt is fasting  . Diabetes    HPI: Shannon Hodge is a 73 y.o. male who presents to the office today for follow up of diabetes and problems listed above in the chief complaint.   Diabetes follow up: His diabetic control is reported as Unchanged.  He feels well.  He did have a steroid epidural injection about 3 months ago.  He is consistent with his exercise.  He denies symptoms of hyperglycemia. He denies exertional CP or SOB or symptomatic hypoglycemia. He denies foot sores.  His peripheral neuropathy continues to be bothersome and he and the neurologist are working on that.  He is on gabapentin high-dose.  Immunizations are all up-to-date.  Eye exam is up-to-date.  Hyperlipidemia has been controlled on simvastatin.  No myalgias.  Lumbar radiculopathy did respond to the steroid injection.  He has mild aches and pains at times.  He continues to be active with daily exercise  History of vitamin B12 deficiency on daily supplements.  Wt Readings from Last 3 Encounters:  09/14/18 179 lb 3.2 oz (81.3 kg)  09/05/18 181 lb (82.1 kg)  07/03/18 176 lb (79.8 kg)    BP Readings from Last 3 Encounters:  09/14/18 128/62  09/05/18 (!) 122/58  07/03/18 (!) 118/59    Assessment  1. Controlled type 2 diabetes mellitus with diabetic polyneuropathy, without long-term current use of insulin ([place])   2. Mixed hyperlipidemia   3. Lumbar radiculopathy   4. Vitamin B12 deficiency      Plan   Diabetes is currently marginally controlled.  Will increase metformin to max dose.  Recheck 3 months.  Elevation could in part be due to steroids.  Education given.  Continue on ACE inhibitor.  Recheck lipids, fasting.  On statin.  Check LFTs.  Back pain is improving.  Peripheral neuropathy per neurology.  Muscle cramps are improved as well.  Recheck vitamin B12 levels.  Annual wellness visit was reviewed.  Follow up: Return in  about 3 months (around 12/13/2018) for follow up Diabetes.. Orders Placed This Encounter  Procedures  . Comprehensive metabolic panel  . Lipid panel  . Vitamin B12  . CBC with Differential/Platelet  . POCT glycosylated hemoglobin (Hb A1C)   Meds ordered this encounter  Medications  . metFORMIN (GLUCOPHAGE-XR) 500 MG 24 hr tablet    Sig: Take 2 tablets (1,000 mg total) by mouth 2 (two) times daily.    Dispense:  270 tablet    Refill:  1      Immunization History  Administered Date(s) Administered  . Influenza, High Dose Seasonal PF 05/09/2016, 05/04/2017, 04/12/2018  . Influenza-Unspecified 05/01/2015  . Pneumococcal Conjugate-13 10/03/2016  . Pneumococcal Polysaccharide-23 01/27/2011, 09/21/2017  . Tdap 01/27/2011  . Zoster 01/19/2010  . Zoster Recombinat (Shingrix) 06/25/2018, 09/06/2018    Diabetes Related Lab Review: Lab Results  Component Value Date   HGBA1C 7.1 (A) 09/14/2018   HGBA1C 6.4 (A) 03/27/2018   HGBA1C 5.9 08/30/2017    Lab Results  Component Value Date   MICROALBUR <0.7 03/27/2018   Lab Results  Component Value Date   CREATININE 1.22 03/27/2018   BUN 23 03/27/2018   NA 142 03/27/2018   K 4.4 03/27/2018   CL 101 03/27/2018   CO2 34 (H) 03/27/2018   Lab Results  Component Value Date   CHOL 169 03/27/2018   CHOL 150 08/30/2017   Lab Results  Component  Value Date   HDL 62.20 03/27/2018   HDL 62 08/30/2017   Lab Results  Component Value Date   LDLCALC 92 03/27/2018   LDLCALC 71 08/30/2017   Lab Results  Component Value Date   TRIG 76.0 03/27/2018   TRIG 105 08/30/2017   Lab Results  Component Value Date   CHOLHDL 3 03/27/2018   No results found for: LDLDIRECT The 10-year ASCVD risk score Mikey Bussing DC Jr., et al., 2013) is: 27.2%   Values used to calculate the score:     Age: 55 years     Sex: Male     Is Non-Hispanic African American: No     Diabetic: Yes     Tobacco smoker: No     Systolic Blood Pressure: 811 mmHg     Is BP  treated: No     HDL Cholesterol: 62.2 mg/dL     Total Cholesterol: 169 mg/dL I have reviewed the PMH, Fam and Soc history. Patient Active Problem List   Diagnosis Date Noted  . Restless leg syndrome 09/21/2017    Priority: High  . Mixed hyperlipidemia 09/21/2017    Priority: High  . Controlled type 2 diabetes mellitus with diabetic polyneuropathy, without long-term current use of insulin ([name]) 09/01/2016    Priority: High  . Insomnia 02/22/2016    Priority: High  . Lumbar radiculopathy 11/04/2014    Priority: High  . Hearing loss 09/21/2017    Priority: Medium    Wears hearing aides   . Muscle cramp, nocturnal 09/21/2017    Priority: Medium  . Foot drop, left 09/21/2017    Priority: Medium  . 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 Vitamin B12 deficiency 09/21/2017    Priority: Low  . Allergic rhinitis 02/22/2016    Priority: Low  . Genital herpes 02/22/2016    Priority: Low  . 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 He has never used smokeless tobacco. He reports that he does not drink alcohol or use drugs.  Review of Systems: Ophthalmic: negative for eye pain, loss of vision or double vision Cardiovascular: negative for chest pain Respiratory: negative for SOB or persistent cough Gastrointestinal: negative for abdominal pain Genitourinary: negative for dysuria or gross hematuria MSK: negative for foot lesions Neurologic: negative for weakness or gait disturbance  Objective  Vitals: BP 128/62   Pulse (!) 56   Temp 97.8 F (36.6 C) (Oral)   Resp 16   Ht 6' (1.829 m)   Wt 179 lb 3.2 oz (81.3 kg)   SpO2 97%   BMI 24.30 kg/m  General: well appearing, no acute distress  Psych:  Alert and oriented, normal mood and affect HEENT:  Normocephalic, atraumatic, moist mucous membranes, supple neck  Cardiovascular:  Nl S1 and S2, RRR without murmur, gallop or rub. no edema Respiratory:  Good breath sounds  bilaterally, CTAB with normal effort, no rales Gastrointestinal: normal BS, soft, nontender Skin:  Warm, no rashes Neurologic:   Mental status is normal. normal gait Foot exam: no erythema, pallor, or cyanosis visible nl proprioception and sensation to monofilament testing bilaterally, +2 distal pulses bilaterally    Diabetic education: ongoing education regarding chronic disease management for diabetes was given today. We continue to reinforce the ABC's of diabetic management: A1c (<7 or 8 dependent upon patient), tight blood pressure control, and cholesterol management with goal LDL < 100 minimally. We discuss diet strategies, exercise recommendations, medication options and possible side  effects. At each visit, we review recommended immunizations and preventive care recommendations for diabetics and stress that good diabetic control can prevent other problems. See below for this patient's data.    Commons side effects, risks, benefits, and alternatives for medications and treatment plan prescribed today were discussed, and the patient expressed understanding of the given instructions. Patient is instructed to call or message via MyChart if he/she has any questions or concerns regarding our treatment plan. No barriers to understanding were identified. We discussed Red Flag symptoms and signs in detail. Patient expressed understanding regarding what to do in case of urgent or emergency type symptoms.   Medication list was reconciled, printed and provided to the patient in AVS. Patient instructions and summary information was reviewed with the patient as documented in the AVS. This note was prepared with assistance of Dragon voice recognition software. Occasional wrong-word or sound-a-like substitutions may have occurred due to the inherent limitations of voice recognition software

## 2018-09-15 ENCOUNTER — Encounter: Payer: Self-pay | Admitting: Family Medicine

## 2018-09-27 ENCOUNTER — Other Ambulatory Visit: Payer: Self-pay | Admitting: Family Medicine

## 2018-10-03 ENCOUNTER — Other Ambulatory Visit: Payer: Self-pay | Admitting: *Deleted

## 2018-10-03 MED ORDER — METFORMIN HCL ER 500 MG PO TB24: 500.0000 mg | ORAL_TABLET | Freq: Four times a day (QID) | ORAL | 1 refills | Status: DC

## 2018-12-03 ENCOUNTER — Telehealth: Payer: Self-pay | Admitting: Family Medicine

## 2018-12-03 NOTE — Telephone Encounter (Signed)
Please advise 

## 2018-12-03 NOTE — Telephone Encounter (Signed)
Called pt to schedule doxy. Pt declined doxy at this time and stated he would "use his best judgement"   Copied from [place] [id number]. Topic: General - Inquiry >> Dec 03, 2018  9:07 AM Virl Axe D wrote: Reason for CRM: Pt would like to know about occasional use of ibuprofen 200mg  1 tablet. In his records it shows that he should not take any because of GI bleeding at one time. It is the only over the counter that gives relief with strong back pain and it's used for sleeping, not during the day. He would like to know what the risks are for a very small dose occasionally. Pt would like a callback with what Dr. Jonni Sanger recommends. Please advise. WT#[phone] >> Dec 03, 2018  9:25 AM Carole Binning B wrote: Schedule doxy to advise

## 2018-12-03 NOTE — Telephone Encounter (Signed)
Please call patient. He may use an occassional ibuprofen 200mg  with food. If he is needing it on more than just once in awhile, he could use prilosec daily with it to protect his stomach.   Rare use is low risk for another bleed but a small risk is present.

## 2018-12-03 NOTE — Telephone Encounter (Signed)
Pt aware and verbalized understanding.  

## 2018-12-25 ENCOUNTER — Telehealth: Payer: Self-pay

## 2018-12-25 ENCOUNTER — Telehealth: Payer: Self-pay | Admitting: Diagnostic Neuroimaging

## 2018-12-25 DIAGNOSIS — E1142 Type 2 diabetes mellitus with diabetic polyneuropathy: Secondary | ICD-10-CM

## 2018-12-25 MED ORDER — GABAPENTIN 600 MG PO TABS: 900.0000 mg | ORAL_TABLET | Freq: Two times a day (BID) | ORAL | 4 refills | Status: DC

## 2018-12-25 NOTE — Telephone Encounter (Signed)
Patient stated that with his Gabapentin being sent to [facility] it cost him $216.00 he has done some research and has found out if he uses [facility] it will only cost him $25.00. he wants the refill to be sent to [facility] the one [facility]. He states he wants 2 months worth of supply. He also stated that he would like a call back to make sure everything was sent to [facility].

## 2018-12-25 NOTE — Telephone Encounter (Signed)
Called patient and confirmed which Shannon Hodge he requested.  This was done and new Rx sent to [facility] as he requested.

## 2018-12-25 NOTE — Addendum Note (Signed)
Addended by: Minna Antis on: 12/25/2018 11:29 AM   Modules accepted: Orders

## 2018-12-25 NOTE — Telephone Encounter (Signed)
Spoke with the patient and they have given verbal consent to file their insurance and to do a doxy.me visit. E-mail, mobile number and carrier have been confirmed and sent.  E-mail: [email or web address] Text: [phone] (Straight talk)

## 2019-01-01 DIAGNOSIS — E1142 Type 2 diabetes mellitus with diabetic polyneuropathy: Secondary | ICD-10-CM | POA: Insufficient documentation

## 2019-01-01 NOTE — Telephone Encounter (Signed)
Pt called back and stated he did not receive the link. I recent the link to his smartphone as he requested.

## 2019-01-01 NOTE — Telephone Encounter (Signed)
Noted! Thank you

## 2019-01-01 NOTE — Progress Notes (Signed)
PATIENT: Shannon Hodge DOB: [birth year]  REASON FOR VISIT: follow up HISTORY FROM: patient  Virtual Visit via Telephone Note  I connected with Shannon Hodge on 01/01/19 at  8:30 AM EDT by telephone and verified that I am speaking with the correct person using two identifiers.   I discussed the limitations, risks, security and privacy concerns of performing an evaluation and management service by telephone and the availability of in person appointments. I also discussed with the patient that there may be a patient responsible charge related to this service. The patient expressed understanding and agreed to proceed.   History of Present Illness:  01/01/19 Shannon Hodge is a 73 y.o. male for follow up of neuropathy. He continues to do well with gabapentin 900 mg twice daily.  He does feel that this helps to control numbness and tingling.  He is tolerating gabapentin well with no obvious adverse effects.  He does mention continued concerns of muscle cramps, specifically in the bottom of his calf.  He was recently increased to a 40 mg dose of simvastatin.  He is following closely with his primary care provider regarding cramps.  He also follows with orthopedics for prior Achilles injury.   HISTORY (copied from [facility] note on 07/03/2018)  Shannon Hodge is a 73 year old male with a history of diabetic neuropathy and lumbar radiculopathy.  He returns today for follow-up.  He states in regards to his neuropathy he is not having significant burning or tingling in the feet.  He states that his main complaint is he is waking up during the night with muscle cramps in the feet bilaterally.  He states that he has no trouble falling asleep but typically will wake up at least once with muscle cramps.  He states once he gets out of bed and walks around the cramps will resolve in less than 5 minutes.  He states that he has noticed that if he is very active during the day the cramps are more prevalent.  He states  lately they have been occurring nightly.  Reports that he does not drink a lot of water.  He may drink 1 bottle of water a day but typically drinks tea and Diet Coke.  He did have cortisone injections with his orthopedist 6 weeks ago for the back.  He returns today for evaluation.   UPDATE (02/20/18, VRP): Since last visit, doingabout the same. Symptoms arestable. Severityis mild. No alleviating or aggravating factors. Toleratinggabapentin.  UPDATE 01/27/17: Since last visit, had right achilles tear on 11/15/16. Now healing and recovering. Overall pain in feet and legs are stable.  UPDATE 07/28/16: Since last visit, sxs are slightly worse. Night time cramping, pain and interrupted sleep.  UPDATE 11/23/15: Since last visit, neuropathy sxs are stable. Exercising more and doing well. Nighttime cramps.   UPDATE 05/20/15: Since last visit, tried PT and using gabapentin. Still with cramps, numbness and left hip pain.  PRIOR HPI (11/04/14): 73 year old right-handed male here for evaluation of low back pain, leg cramps, neuropathy. Patient reports some symptoms starting around age 15-54 years old, but more significant symptoms in the last 6-12 months. In his 56s, patient developed low back pain, rating left side, left leg, with mild left foot drop. Patient was diagnosed with some degenerative lumbar spine disease, possible left L5 radiculopathy, treated conservatively. Patient had lingering symptoms throughout his life. Occasionally he would have cramping, pain in his left leg. In 2000, patient diagnosed diabetes. Over past 1-2 years he has noticed some numbness  and tingling in his toes and feet, mainly the bottom. He feels a thick, numb, dead sensation in his feet. No significant electrical, stinging, pins and needles pain in his feet. Patient started on gabapentin by PCP which has mildly helped his numbness symptoms. Now patient having more problems with cramping in his left leg, hamstring region, calf,  intermittently, mainly when he lays down or sits down for a long time. Moving, standing, stretching seems to help. He is fairly active at the gym several times per week, using upper body strength machines, weightlifting, treadmill walking.   Observations/Objective:  Generalized: Well developed, in no acute distress  Mentation: Alert oriented to time, place, history taking. Follows all commands speech and language fluent   Assessment and Plan:  73 y.o. year old male  has a past medical history of Diabetes ([place]), Diabetic neuropathy ([place]), DJD (degenerative joint disease), Hypercholesteremia, Hyperlipidemia, Hypertension, Neuropathy, and Ruptured Achilles tendon due to trauma (10/2016). here with    ICD-10-CM   1. Lumbar radiculopathy M54.16   2. Diabetic polyneuropathy associated with type 2 diabetes mellitus (HCC) E11.42    He continues to do well on gabapentin 900 mg twice daily.  We will continue this therapy.  Side effects of medication discussed.  I have encouraged close follow-up with primary care regarding cramping.  He will continue with orthopedics as well for management of Achilles pain.  We will follow-up annually.   No orders of the defined types were placed in this encounter.   No orders of the defined types were placed in this encounter.    Follow Up Instructions:  I discussed the assessment and treatment plan with the patient. The patient was provided an opportunity to ask questions and all were answered. The patient agreed with the plan and demonstrated an understanding of the instructions.   The patient was advised to call back or seek an in-person evaluation if the symptoms worsen or if the condition fails to improve as anticipated.  I provided 25 minutes of non-face-to-face time during this encounter.  Patient is located at his place of residence during video conference.  Provider is located at her place of residence.  Maryelizabeth Kaufmann, CMA helped to facilitate visit.    Debbora Presto, NP

## 2019-01-02 ENCOUNTER — Encounter: Payer: Self-pay | Admitting: Family Medicine

## 2019-01-02 ENCOUNTER — Other Ambulatory Visit: Payer: Self-pay

## 2019-01-02 ENCOUNTER — Ambulatory Visit (INDEPENDENT_AMBULATORY_CARE_PROVIDER_SITE_OTHER): Payer: Medicare Other | Admitting: Family Medicine

## 2019-01-02 DIAGNOSIS — E1142 Type 2 diabetes mellitus with diabetic polyneuropathy: Secondary | ICD-10-CM

## 2019-01-02 DIAGNOSIS — M5416 Radiculopathy, lumbar region: Secondary | ICD-10-CM | POA: Diagnosis not present

## 2019-01-02 NOTE — Progress Notes (Signed)
I reviewed note and agree with plan.   Penni Bombard, MD 2/0/1007, 1:21 PM Certified in Neurology, Neurophysiology and Neuroimaging  [facility] [address], [place], [place] [address] [phone]

## 2019-01-03 ENCOUNTER — Encounter: Payer: Self-pay | Admitting: Family Medicine

## 2019-01-03 ENCOUNTER — Other Ambulatory Visit: Payer: Self-pay

## 2019-01-03 ENCOUNTER — Ambulatory Visit (INDEPENDENT_AMBULATORY_CARE_PROVIDER_SITE_OTHER): Payer: Medicare Other | Admitting: Family Medicine

## 2019-01-03 VITALS — BP 121/75 | HR 60 | Temp 98.0°F | Ht 72.0 in | Wt 176.0 lb

## 2019-01-03 DIAGNOSIS — E782 Mixed hyperlipidemia: Secondary | ICD-10-CM

## 2019-01-03 DIAGNOSIS — E1142 Type 2 diabetes mellitus with diabetic polyneuropathy: Secondary | ICD-10-CM

## 2019-01-03 LAB — LIPID PANEL
Cholesterol: 138 mg/dL (ref 0–200)
HDL: 54.2 mg/dL (ref >39.00)
LDL Cholesterol: 67 mg/dL (ref 0–99)
NonHDL: 83.36
Total CHOL/HDL Ratio: 3
Triglycerides: 83 mg/dL (ref 0.0–149.0)
VLDL: 16.6 mg/dL (ref 0.0–40.0)

## 2019-01-03 LAB — COMPREHENSIVE METABOLIC PANEL
ALT: 21 U/L (ref 0–53)
AST: 18 U/L (ref 0–37)
Albumin: 4.6 g/dL (ref 3.5–5.2)
Alkaline Phosphatase: 42 U/L (ref 39–117)
BUN: 25 mg/dL — ABNORMAL HIGH (ref 6–23)
CO2: 32 mEq/L (ref 19–32)
Calcium: 9.7 mg/dL (ref 8.4–10.5)
Chloride: 102 mEq/L (ref 96–112)
Creatinine, Ser: 1.15 mg/dL (ref 0.40–1.50)
GFR: 62.79 mL/min (ref >60.00)
Glucose, Bld: 105 mg/dL — ABNORMAL HIGH (ref 70–99)
Potassium: 4.4 mEq/L (ref 3.5–5.1)
Sodium: 142 mEq/L (ref 135–145)
Total Bilirubin: 0.8 mg/dL (ref 0.2–1.2)
Total Protein: 6.7 g/dL (ref 6.0–8.3)

## 2019-01-03 LAB — POCT GLYCOSYLATED HEMOGLOBIN (HGB A1C): Hemoglobin A1C: 6.7 % — AB (ref 4.0–5.6)

## 2019-01-03 NOTE — Patient Instructions (Signed)
Please return in 3 months for diabetes follow up We will see how you are doing with your leg cramps as well at that time.   Start CoEnzyme Q10 daily. This may decrease the muscle cramps if they are coming from your statin.  IF it does not help, then decrease back to simvastatin 20mg  nightly.  Mychart me to let me know how you are doing in about a month. You may schedule a visit at that if needed.   Your diabetes looks great.   If you have any questions or concerns, please don't hesitate to send me a message via MyChart or call the office at [phone]. Thank you for visiting with [place] today! It's our pleasure caring for you.

## 2019-01-03 NOTE — Progress Notes (Signed)
Subjective  CC:  Chief Complaint  Patient presents with  . Follow-up  . Diabetes  . Hyperlipidemia    HPI: Shannon Hodge is a 73 y.o. male who presents to the office today for follow up of diabetes and problems listed above in the chief complaint.   Diabetes follow up: His diabetic control is reported as improved.  We increased the dosage of his medication 3 months ago to get better control.  He is tolerating it well.  He has no symptoms of hyperglycemia. He denies exertional CP or SOB or symptomatic hypoglycemia. He has chronic symptoms of peripheral neuropathy that are managed by neurology and gabapentin.  He denies foot sores.  He eats a healthy diet.  Exercise has been decreased due to the closed gyms.  HLD: increased dose of statin at last visit to push ldl < 80.  However, since increasing his dose he has had increased lower extremity muscle cramps.  He believes this is due to side effect of statin.  Peripheral neuropathy: Moderately well controlled.  See recent neurology note.  Wt Readings from Last 3 Encounters:  01/03/19 176 lb (79.8 kg)  09/14/18 179 lb 3.2 oz (81.3 kg)  09/05/18 181 lb (82.1 kg)    BP Readings from Last 3 Encounters:  01/03/19 121/75  09/14/18 128/62  09/05/18 (!) 122/58    Assessment  1. Controlled type 2 diabetes mellitus with diabetic polyneuropathy, without long-term current use of insulin ([place])   2. Mixed hyperlipidemia   3. Diabetic polyneuropathy associated with type 2 diabetes mellitus ([place])      Plan   Diabetes is currently very well controlled.  Continue current medications.  Screens are up-to-date.  Immunizations are up-to-date.  Recheck 3 months  Next hyperlipidemia on statin with muscle cramping: Recommend starting coenzyme every 10 to see if that helps.  Start tonic water.  Recommend good hydration and stretching.  If symptoms persist, will decrease pravastatin back down to 20 mg nightly to see if that helps to confirm that it is a  statin side effect.  Recheck in 4 weeks via my chart note.  Will further adjust medications depending upon how he does with these changes.  Patient understands and agrees with care plan.  Continue gabapentin for polyneuropathy.  No diabetic foot sores.  Follow up: Return in about 3 months (around 04/05/2019) for follow up Hypertension.. Orders Placed This Encounter  Procedures  . Comprehensive metabolic panel  . Lipid panel  . POCT glycosylated hemoglobin (Hb A1C)   No orders of the defined types were placed in this encounter.     Immunization History  Administered Date(s) Administered  . Influenza, High Dose Seasonal PF 05/09/2016, 05/04/2017, 04/12/2018  . Influenza-Unspecified 05/01/2015  . Pneumococcal Conjugate-13 10/03/2016  . Pneumococcal Polysaccharide-23 01/27/2011, 09/21/2017  . Tdap 01/27/2011  . Zoster 01/19/2010  . Zoster Recombinat (Shingrix) 06/25/2018, 09/06/2018    Diabetes Related Lab Review: Lab Results  Component Value Date   HGBA1C 6.7 (A) 01/03/2019   HGBA1C 7.1 (A) 09/14/2018   HGBA1C 6.4 (A) 03/27/2018    Lab Results  Component Value Date   MICROALBUR <0.7 03/27/2018   Lab Results  Component Value Date   CREATININE 1.15 01/03/2019   BUN 25 (H) 01/03/2019   NA 142 01/03/2019   K 4.4 01/03/2019   CL 102 01/03/2019   CO2 32 01/03/2019   Lab Results  Component Value Date   CHOL 138 01/03/2019   CHOL 164 09/14/2018   CHOL 169 03/27/2018  Lab Results  Component Value Date   HDL 54.20 01/03/2019   HDL 51.60 09/14/2018   HDL 62.20 03/27/2018   Lab Results  Component Value Date   LDLCALC 67 01/03/2019   [place] 91 09/14/2018   LDLCALC 92 03/27/2018   Lab Results  Component Value Date   TRIG 83.0 01/03/2019   TRIG 106.0 09/14/2018   TRIG 76.0 03/27/2018   Lab Results  Component Value Date   CHOLHDL 3 01/03/2019   CHOLHDL 3 09/14/2018   CHOLHDL 3 03/27/2018   No results found for: LDLDIRECT The 10-year ASCVD risk score Mikey Bussing DC  Jr., et al., 2013) is: 24.1%   Values used to calculate the score:     Age: 43 years     Sex: Male     Is Non-Hispanic African American: No     Diabetic: Yes     Tobacco smoker: No     Systolic Blood Pressure: 269 mmHg     Is BP treated: No     HDL Cholesterol: 54.2 mg/dL     Total Cholesterol: 138 mg/dL I have reviewed the PMH, Fam and Soc history. Patient Active Problem List   Diagnosis Date Noted  . Restless leg syndrome 09/21/2017    Priority: High  . Mixed hyperlipidemia 09/21/2017    Priority: High  . Controlled type 2 diabetes mellitus with diabetic polyneuropathy, without long-term current use of insulin ([place]) 09/01/2016    Priority: High  . Insomnia 02/22/2016    Priority: High  . Lumbar radiculopathy 11/04/2014    Priority: High  . Hearing loss 09/21/2017    Priority: Medium    Wears hearing aides   . Muscle cramp, nocturnal 09/21/2017    Priority: Medium  . Foot drop, left 09/21/2017    Priority: Medium  . 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 Vitamin B12 deficiency 09/21/2017    Priority: Low  . Allergic rhinitis 02/22/2016    Priority: Low  . Genital herpes 02/22/2016    Priority: Low  . Diabetic polyneuropathy associated with type 2 diabetes mellitus ([place]) 01/01/2019  . 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 He has never used smokeless tobacco. He reports that he does not drink alcohol or use drugs.  Review of Systems: Ophthalmic: negative for eye pain, loss of vision or double vision Cardiovascular: negative for chest pain Respiratory: negative for SOB or persistent cough Gastrointestinal: negative for abdominal pain Genitourinary: negative for dysuria or gross hematuria MSK: negative for foot lesions Neurologic: negative for weakness or gait disturbance  Objective  Vitals: BP 121/75 (BP Location: Right Arm, Patient Position: Sitting)   Pulse 60   Temp 98 F (36.7 C) (Oral)   Ht 6'  (1.829 m)   Wt 176 lb (79.8 kg)   SpO2 95%   BMI 23.87 kg/m  General: well appearing, no acute distress  Psych:  Alert and oriented, normal mood and affect HEENT:  Normocephalic, atraumatic, moist mucous membranes, supple neck  Cardiovascular:  Nl S1 and S2, RRR without murmur, gallop or rub. no edema Respiratory:  Good breath sounds bilaterally, CTAB with normal effort, no rales Gastrointestinal: normal BS, soft, nontender Skin:  Warm, no rashes Neurologic:   Mental status is normal. normal gait Foot exam: no erythema, pallor, or cyanosis visible nl proprioception and sensation to monofilament testing bilaterally, +2 distal pulses bilaterally    Diabetic education: ongoing education regarding chronic disease management for diabetes was given  today. We continue to reinforce the ABC's of diabetic management: A1c (<7 or 8 dependent upon patient), tight blood pressure control, and cholesterol management with goal LDL < 100 minimally. We discuss diet strategies, exercise recommendations, medication options and possible side effects. At each visit, we review recommended immunizations and preventive care recommendations for diabetics and stress that good diabetic control can prevent other problems. See below for this patient's data.    Commons side effects, risks, benefits, and alternatives for medications and treatment plan prescribed today were discussed, and the patient expressed understanding of the given instructions. Patient is instructed to call or message via MyChart if he/she has any questions or concerns regarding our treatment plan. No barriers to understanding were identified. We discussed Red Flag symptoms and signs in detail. Patient expressed understanding regarding what to do in case of urgent or emergency type symptoms.   Medication list was reconciled, printed and provided to the patient in AVS. Patient instructions and summary information was reviewed with the patient as  documented in the AVS. This note was prepared with assistance of Dragon voice recognition software. Occasional wrong-word or sound-a-like substitutions may have occurred due to the inherent limitations of voice recognition software

## 2019-01-07 DIAGNOSIS — M48061 Spinal stenosis, lumbar region without neurogenic claudication: Secondary | ICD-10-CM | POA: Diagnosis not present

## 2019-01-16 ENCOUNTER — Encounter: Payer: Self-pay | Admitting: Family Medicine

## 2019-01-16 ENCOUNTER — Telehealth: Payer: Medicare Other | Admitting: Family

## 2019-01-16 DIAGNOSIS — R519 Headache, unspecified: Secondary | ICD-10-CM

## 2019-01-16 NOTE — Progress Notes (Signed)
Based on what you shared with me, I feel your condition warrants further evaluation and I recommend that you be seen for a face to face office visit.  Given your age and your symptom of headache for over a week with sensitivity to light I feel that it would be best to be evaluated face-to-face.  NOTE: If you entered your credit card information for this eVisit, you will not be charged. You may see a "hold" on your card for the $35 but that hold will drop off and you will not have a charge processed.  If you are having a true medical emergency please call 911.     For an urgent face to face visit, [place] has five urgent care centers for your convenience:    [email or web address] to reserve your spot online an avoid wait times  [facility] [address], [address] [place], [place] [address] Modified hours of operation: Monday-Friday, 12 PM to 6 PM  Closed Saturday & Sunday  *Across the street from [place] (New Address!) [address] [place], [place] [address] *Just off [address], across the road from [place] hours of operation: Monday-Friday, 12 PM to 6 PM  Closed Saturday & Sunday   The following sites will take your insurance:  . [facility]    [phone]                  Get Driving Directions  [address], [place] [address] . 10 am to 8 pm Monday-Friday . 12 pm to 8 pm Saturday-Sunday   . [facility] at [place]                  Get Driving Directions  [address], [place], [place] [address] . 8 am to 8 pm Monday-Friday . 9 am to 6 pm Saturday . 11 am to 6 pm Sunday   . [facility] at [place]                  Get Driving Directions   [address] [place], [place] [address] . 8 am to 8 pm Monday-Friday . 8 am to 4 pm Saturday-Sunday    .  [facility] at [place]                    Get Driving Directions  [phone]  [address], [place], [place] [address]  . Monday-Friday, 12 PM to 6 PM    Your e-visit answers were reviewed by a board certified advanced clinical practitioner to complete your personal care plan.  Thank you for using e-Visits.

## 2019-01-18 ENCOUNTER — Encounter: Payer: Self-pay | Admitting: Family Medicine

## 2019-01-18 ENCOUNTER — Ambulatory Visit (INDEPENDENT_AMBULATORY_CARE_PROVIDER_SITE_OTHER): Payer: Medicare Other | Admitting: Family Medicine

## 2019-01-18 ENCOUNTER — Other Ambulatory Visit: Payer: Self-pay

## 2019-01-18 VITALS — BP 130/62 | HR 63 | Temp 97.8°F | Resp 16 | Ht 72.0 in | Wt 173.0 lb

## 2019-01-18 DIAGNOSIS — R51 Headache: Secondary | ICD-10-CM | POA: Diagnosis not present

## 2019-01-18 DIAGNOSIS — R252 Cramp and spasm: Secondary | ICD-10-CM | POA: Diagnosis not present

## 2019-01-18 DIAGNOSIS — R519 Headache, unspecified: Secondary | ICD-10-CM

## 2019-01-18 LAB — SEDIMENTATION RATE: Sed Rate: 1 mm/hr (ref 0–20)

## 2019-01-18 NOTE — Patient Instructions (Addendum)
Please follow up as scheduled for your next visit with me: 04/05/2019   If you have any questions or concerns, please don't hesitate to send me a message via MyChart or call the office at [phone]. Thank you for visiting with [place] today! It's our pleasure caring for you.  Try using the tylenol for your headache.  I will release your lab results to you on your MyChart account with further instructions. Please reply with any questions.   You may stop the statin to see if that helps your leg cramps.    Tension Headache, Adult A tension headache is a feeling of pain, pressure, or aching in the head that is often felt over the front and sides of the head. The pain can be dull, or it can feel tight (constricting). There are two types of tension headache:  Episodic tension headache. This is when the headaches happen fewer than 15 days a month.  Chronic tension headache. This is when the headaches happen more than 15 days a month during a 33-month period. A tension headache can last from 30 minutes to several days. It is the most common kind of headache. Tension headaches are not normally associated with nausea or vomiting, and they do not get worse with physical activity. What are the causes? The exact cause of this condition is not known. Tension headaches are often triggered by stress, anxiety, or depression. Other triggers include:  Alcohol.  Too much caffeine or caffeine withdrawal.  Respiratory infections, such as colds, flu, or sinus infections.  Dental problems or teeth clenching.  Tiredness (fatigue).  Holding your head and neck in the same position for a long period of time, such as while using a computer.  Smoking.  Arthritis of the neck. What are the signs or symptoms? Symptoms of this condition include:  A feeling of pressure or tightness around the head.  Dull, aching head pain.  Pain over the front and sides of the head.  Tenderness in the muscles of the head, neck,  and shoulders. How is this diagnosed? This condition may be diagnosed based on your symptoms, your medical history, and a physical exam. If your symptoms are severe or unusual, you may have imaging tests, such as a CT scan or an MRI of your head. Your vision may also be checked. How is this treated? This condition may be treated with lifestyle changes and with medicines that help relieve symptoms. Follow these instructions at home: Managing pain  Take over-the-counter and prescription medicines only as told by your health care provider.  When you have a headache, lie down in a dark, quiet room.  If directed, apply ice to the head and neck: ? Put ice in a plastic bag. ? Place a towel between your skin and the bag. ? Leave the ice on for 20 minutes, 2-3 times a day.  If directed, apply heat to the back of your neck as often as told by your health care provider. Use the heat source that your health care provider recommends, such as a moist heat pack or a heating pad. ? Place a towel between your skin and the heat source. ? Leave the heat on for 20-30 minutes. ? Remove the heat if your skin turns bright red. This is especially important if you are unable to feel pain, heat, or cold. You may have a greater risk of getting burned. Eating and drinking  Eat meals on a regular schedule.  Limit alcohol intake to no more than  1 drink a day for nonpregnant women and 2 drinks a day for men. One drink equals 12 oz of beer, 5 oz of wine, or 1 oz of hard liquor.  Drink enough fluid to keep your urine pale yellow.  Decrease your caffeine intake, or stop using caffeine. Lifestyle  Get 7-9 hours of sleep each night, or get the amount of sleep recommended by your health care provider.  At bedtime, remove all electronic devices from your room. Electronic devices include computers, phones, and tablets.  Find ways to manage your stress. Some things that can help relieve stress  include: ? Exercise. ? Deep breathing exercises. ? Yoga. ? Listening to music. ? Positive mental imagery.  Try to sit up straight and avoid tensing your muscles.  Do not use any products that contain nicotine or tobacco, such as cigarettes and e-cigarettes. If you need help quitting, ask your health care provider. General instructions   Keep all follow-up visits as told by your health care provider. This is important.  Avoid any headache triggers. Keep a headache journal to help find out what may trigger your headaches. For example, write down: ? What you eat and drink. ? How much sleep you get. ? Any change to your diet or medicines. Contact a health care provider if:  Your headache does not get better.  Your headache comes back.  You are sensitive to sounds, light, or smells because of a headache.  You have nausea or you vomit.  Your stomach hurts. Get help right away if:  You suddenly develop a very severe headache along with any of the following: ? A stiff neck. ? Nausea and vomiting. ? Confusion. ? Weakness. ? Double vision or loss of vision. ? Shortness of breath. ? Rash. ? Unusual sleepiness. ? Fever. ? Trouble speaking. ? Pain in your eyes or ears. ? Trouble walking or balancing. ? Feeling faint or passing out. Summary  A tension headache is a feeling of pain, pressure, or aching in the head that is often felt over the front and sides of the head.  A tension headache can last from 30 minutes to several days. It is the most common kind of headache.  This condition may be diagnosed based on your symptoms, your medical history, and a physical exam.  This condition may be treated with lifestyle changes and with medicines that help relieve symptoms. This information is not intended to replace advice given to you by your health care provider. Make sure you discuss any questions you have with your health care provider. Document Released: 07/18/2005 Document  Revised: 10/28/2016 Document Reviewed: 10/28/2016 Elsevier Interactive Patient Education  2019 [employer].

## 2019-01-18 NOTE — Progress Notes (Signed)
Subjective  CC:  Chief Complaint  Patient presents with  . Headache    Reports that they are left sided, has a sore spot over his left eye, and symptoms are worse in the evening. He has tried Aspirin and Tylenol with minimal relief    HPI: Shannon Hodge is a 73 y.o. male who presents to the office today to address the problems listed above in the chief complaint.  73 year old with vague symptoms, complains of 2-week mild pressure sensation on the left side of his temporal area.  Also a tender spot at the medial aspect of his orbit.  Denies pain with mastication or hair brushing.  No vision disturbances.  No sinus symptoms.  No fevers chills or sweats.  No cough.  He does admit that he worries a lot about his health and this could be contributing.  No nausea, vomiting or photophobia.  No history of migraines.  No severe headaches.  Tylenol resolves the headache.  He worries it could be recurrence of Bell's palsy but denies facial drooping.  Leg cramps continue to be his worst problem managed by neurology.  We have considered if statins are worsening his problem.  He thinks by increasing his dose his symptoms have worsened.  He is taking coenzyme Q 10 Assessment  1. Left temporal headache   2. Muscle cramp, nocturnal      Plan   Temporal headache: Most consistent with tension-like headaches but symptoms are very mild.  Monitor for now.  Stress reduction Tylenol use.  Check sed rate to help rule out temporal arteritis.  Although he was not tender on exam  Leg cramps: Possibly worsened by statin.  Trial off statin x2 to 4 weeks.  Follow up: Return for as scheduled.  04/05/2019  Orders Placed This Encounter  Procedures  . Sedimentation rate   No orders of the defined types were placed in this encounter.     I reviewed the patients updated PMH, FH, and SocHx.    Patient Active Problem List   Diagnosis Date Noted  . Restless leg syndrome 09/21/2017    Priority: High  . Mixed  hyperlipidemia 09/21/2017    Priority: High  . Controlled type 2 diabetes mellitus with diabetic polyneuropathy, without long-term current use of insulin ([place]) 09/01/2016    Priority: High  . Insomnia 02/22/2016    Priority: High  . Lumbar radiculopathy 11/04/2014    Priority: High  . Hearing loss 09/21/2017    Priority: Medium  . Muscle cramp, nocturnal 09/21/2017    Priority: Medium  . Foot drop, left 09/21/2017    Priority: Medium  . 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 Vitamin B12 deficiency 09/21/2017    Priority: Low  . Allergic rhinitis 02/22/2016    Priority: Low  . Genital herpes 02/22/2016    Priority: Low  . Diabetic polyneuropathy associated with type 2 diabetes mellitus ([place]) 01/01/2019  . 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 acetaminophen (TYLENOL) 500 MG tablet Take 500 mg by mouth daily as needed for moderate pain or headache.  Marland Kitchen aspirin 325 MG tablet Take 162.5 mg by mouth daily.   . cholecalciferol (VITAMIN D) 1000 units tablet Take 1,000 Units by mouth daily.  . Cyanocobalamin (VITAMIN B-12 PO) Take by mouth daily.  . fenofibrate micronized (LOFIBRA) 134 MG capsule Take 1 capsule (134 mg total) by mouth daily before breakfast.  . gabapentin (NEURONTIN) 600 MG tablet Take 1.5 tablets (900  mg total) by mouth 2 (two) times daily.  Marland Kitchen lisinopril (PRINIVIL,ZESTRIL) 20 MG tablet Take 1 tablet (20 mg total) by mouth daily.  . Magnesium Oxide (MAG-200) 200 MG TABS Take 400 mg by mouth daily.   . metFORMIN (GLUCOPHAGE-XR) 500 MG 24 hr tablet Take 1 tablet (500 mg total) by mouth 4 (four) times daily.  Marland Kitchen pyridOXINE (VITAMIN B-6) 100 MG tablet Take 100 mg by mouth every evening.  . simvastatin (ZOCOR) 40 MG tablet Take 1 tablet (40 mg total) by mouth at bedtime.  . valACYclovir (VALTREX) 500 MG tablet Take 500 mg by mouth daily as needed (outbreak).     Allergies: Patient has No Known Allergies. Family History: Patient family history  includes Brain cancer in his father; Diabetes in his father, maternal grandfather, maternal grandmother, paternal grandfather, and paternal grandmother; Healthy in his daughter and son; Pancreatic cancer in his mother; Stroke in his father. Social History:  Patient  reports that he has never smoked. He has never used smokeless tobacco. He reports that he does not drink alcohol or use drugs.  Review of Systems: Constitutional: Negative for fever malaise or anorexia Cardiovascular: negative for chest pain Respiratory: negative for SOB or persistent cough Gastrointestinal: negative for abdominal pain  Objective  Vitals: BP 130/62   Pulse 63   Temp 97.8 F (36.6 C) (Oral)   Resp 16   Ht 6' (1.829 m)   Wt 173 lb (78.5 kg)   SpO2 98%   BMI 23.46 kg/m  General: no acute distress , A&Ox3, appears comfortable HEENT: PEERL, conjunctiva normal, palpable bilateral temporal arteries, no tenderness or cords palpated, oropharynx moist,neck is supple Cardiovascular:  RRR without murmur or gallop.  Respiratory:  Good breath sounds bilaterally, CTAB with normal respiratory effort Skin:  Warm, no rashes     Commons side effects, risks, benefits, and alternatives for medications and treatment plan prescribed today were discussed, and the patient expressed understanding of the given instructions. Patient is instructed to call or message via MyChart if he/she has any questions or concerns regarding our treatment plan. No barriers to understanding were identified. We discussed Red Flag symptoms and signs in detail. Patient expressed understanding regarding what to do in case of urgent or emergency type symptoms.   Medication list was reconciled, printed and provided to the patient in AVS. Patient instructions and summary information was reviewed with the patient as documented in the AVS. This note was prepared with assistance of Dragon voice recognition software. Occasional wrong-word or sound-a-like  substitutions may have occurred due to the inherent limitations of voice recognition software

## 2019-02-13 DIAGNOSIS — H40023 Open angle with borderline findings, high risk, bilateral: Secondary | ICD-10-CM | POA: Diagnosis not present

## 2019-02-13 DIAGNOSIS — H2512 Age-related nuclear cataract, left eye: Secondary | ICD-10-CM | POA: Diagnosis not present

## 2019-02-13 DIAGNOSIS — E119 Type 2 diabetes mellitus without complications: Secondary | ICD-10-CM | POA: Diagnosis not present

## 2019-02-26 ENCOUNTER — Ambulatory Visit: Payer: Medicare Other | Admitting: Adult Health

## 2019-03-15 ENCOUNTER — Encounter: Payer: Self-pay | Admitting: Family Medicine

## 2019-04-05 ENCOUNTER — Other Ambulatory Visit: Payer: Self-pay

## 2019-04-05 ENCOUNTER — Encounter: Payer: Self-pay | Admitting: Family Medicine

## 2019-04-05 ENCOUNTER — Ambulatory Visit (INDEPENDENT_AMBULATORY_CARE_PROVIDER_SITE_OTHER): Payer: Medicare Other | Admitting: Family Medicine

## 2019-04-05 VITALS — BP 136/66 | HR 58 | Temp 97.8°F | Resp 16 | Ht 72.0 in | Wt 173.2 lb

## 2019-04-05 DIAGNOSIS — Z23 Encounter for immunization: Secondary | ICD-10-CM

## 2019-04-05 DIAGNOSIS — E1142 Type 2 diabetes mellitus with diabetic polyneuropathy: Secondary | ICD-10-CM | POA: Diagnosis not present

## 2019-04-05 DIAGNOSIS — R252 Cramp and spasm: Secondary | ICD-10-CM

## 2019-04-05 DIAGNOSIS — G2581 Restless legs syndrome: Secondary | ICD-10-CM | POA: Diagnosis not present

## 2019-04-05 DIAGNOSIS — E782 Mixed hyperlipidemia: Secondary | ICD-10-CM | POA: Diagnosis not present

## 2019-04-05 DIAGNOSIS — E538 Deficiency of other specified B group vitamins: Secondary | ICD-10-CM | POA: Diagnosis not present

## 2019-04-05 LAB — POCT GLYCOSYLATED HEMOGLOBIN (HGB A1C): Hemoglobin A1C: 6.4 % — AB (ref 4.0–5.6)

## 2019-04-05 MED ORDER — METFORMIN HCL ER 500 MG PO TB24: 2000.0000 mg | ORAL_TABLET | Freq: Every day | ORAL | 1 refills | Status: DC

## 2019-04-05 NOTE — Progress Notes (Signed)
Subjective  CC:  Chief Complaint  Patient presents with  . Diabetes    HPI: Shannon Hodge is a 73 y.o. male who presents to the office today for follow up of diabetes and problems listed above in the chief complaint.   Diabetes follow up: His diabetic control is reported as Unchanged. Tolerating metformin 2000 daily. Eating well. No sxs of high blood sugars He denies exertional CP or SOB or symptomatic hypoglycemia. He denies foot sores or paresthesias.   Neuropathy and rls/cramping remains but is stable on meds. Nothing new.   Tolerating increased dose of statin to keep ldl < 70 w/o affecting mm cramps.   Requests blood type: he is curious and knows it will be out of pocket cost. He agrees to pay.   Wt Readings from Last 3 Encounters:  04/05/19 173 lb 3.2 oz (78.6 kg)  01/18/19 173 lb (78.5 kg)  01/03/19 176 lb (79.8 kg)    BP Readings from Last 3 Encounters:  04/05/19 136/66  01/18/19 130/62  01/03/19 121/75    Assessment  1. Controlled type 2 diabetes mellitus with diabetic polyneuropathy, without long-term current use of insulin ([place])   2. Mixed hyperlipidemia   3. Muscle cramp, nocturnal   4. Diabetic polyneuropathy associated with type 2 diabetes mellitus ([place])   5. Restless leg syndrome   6. Vitamin B12 deficiency      Plan   Diabetes is currently very well controlled. Continue same.  Lipids and bp are controlled.   Neuropathy/mm cramps: stable on meds  No med changes today  Blood type and screen ordered.   Follow up: Return in about 6 months (around 10/03/2019) for complete physical, follow up Diabetes,  and AWV.Marland Kitchen Orders Placed This Encounter  Procedures  . Type and screen   Meds ordered this encounter  Medications  . metFORMIN (GLUCOPHAGE-XR) 500 MG 24 hr tablet    Sig: Take 4 tablets (2,000 mg total) by mouth daily.    Dispense:  480 tablet    Refill:  1      Immunization History  Administered Date(s) Administered  . Influenza, High Dose  Seasonal PF 05/09/2016, 05/04/2017, 04/12/2018  . Influenza-Unspecified 05/01/2015  . Pneumococcal Conjugate-13 10/03/2016  . Pneumococcal Polysaccharide-23 01/27/2011, 09/21/2017  . Tdap 01/27/2011  . Zoster 01/19/2010  . Zoster Recombinat (Shingrix) 06/25/2018, 09/06/2018    Diabetes Related Lab Review: Lab Results  Component Value Date   HGBA1C 6.7 (A) 01/03/2019   HGBA1C 7.1 (A) 09/14/2018   HGBA1C 6.4 (A) 03/27/2018    Lab Results  Component Value Date   MICROALBUR <0.7 03/27/2018   Lab Results  Component Value Date   CREATININE 1.15 01/03/2019   BUN 25 (H) 01/03/2019   NA 142 01/03/2019   K 4.4 01/03/2019   CL 102 01/03/2019   CO2 32 01/03/2019   Lab Results  Component Value Date   CHOL 138 01/03/2019   CHOL 164 09/14/2018   CHOL 169 03/27/2018   Lab Results  Component Value Date   HDL 54.20 01/03/2019   HDL 51.60 09/14/2018   HDL 62.20 03/27/2018   Lab Results  Component Value Date   LDLCALC 67 01/03/2019   [place] 91 09/14/2018   LDLCALC 92 03/27/2018   Lab Results  Component Value Date   TRIG 83.0 01/03/2019   TRIG 106.0 09/14/2018   TRIG 76.0 03/27/2018   Lab Results  Component Value Date   CHOLHDL 3 01/03/2019   CHOLHDL 3 09/14/2018   CHOLHDL 3 03/27/2018  No results found for: LDLDIRECT The 10-year ASCVD risk score Mikey Bussing DC Jr., et al., 2013) is: 28.7%   Values used to calculate the score:     Age: 31 years     Sex: Male     Is Non-Hispanic African American: No     Diabetic: Yes     Tobacco smoker: No     Systolic Blood Pressure: [id number] mmHg     Is BP treated: No     HDL Cholesterol: 54.2 mg/dL     Total Cholesterol: 138 mg/dL I have reviewed the PMH, Fam and Soc history. Patient Active Problem List   Diagnosis Date Noted  . Restless leg syndrome 09/21/2017    Priority: High  . Mixed hyperlipidemia 09/21/2017    Priority: High  . Controlled type 2 diabetes mellitus with diabetic polyneuropathy, without long-term current use of  insulin ([place]) 09/01/2016    Priority: High  . Insomnia 02/22/2016    Priority: High  . Lumbar radiculopathy 11/04/2014    Priority: High  . Hearing loss 09/21/2017    Priority: Medium    Wears hearing aides   . Muscle cramp, nocturnal 09/21/2017    Priority: Medium  . Foot drop, left 09/21/2017    Priority: Medium  . 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 Vitamin B12 deficiency 09/21/2017    Priority: Low  . Allergic rhinitis 02/22/2016    Priority: Low  . Genital herpes 02/22/2016    Priority: Low  . Diabetic polyneuropathy associated with type 2 diabetes mellitus ([place]) 01/01/2019  . 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 He has never used smokeless tobacco. He reports that he does not drink alcohol or use drugs.  Review of Systems: Ophthalmic: negative for eye pain, loss of vision or double vision Cardiovascular: negative for chest pain Respiratory: negative for SOB or persistent cough Gastrointestinal: negative for abdominal pain Genitourinary: negative for dysuria or gross hematuria MSK: negative for foot lesions Neurologic: negative for weakness or gait disturbance  Objective  Vitals: BP 136/66   Pulse (!) 58   Temp 97.8 F (36.6 C) (Tympanic)   Resp 16   Ht 6' (1.829 m)   Wt 173 lb 3.2 oz (78.6 kg)   SpO2 97%   BMI 23.49 kg/m  General: well appearing, no acute distress  Psych:  Alert and oriented, normal mood and affect HEENT:  Normocephalic, atraumatic, moist mucous membranes, supple neck  Cardiovascular:  Nl S1 and S2, RRR without murmur, gallop or rub. no edema Respiratory:  Good breath sounds bilaterally, CTAB with normal effort, no rales Gastrointestinal: normal BS, soft, nontender Skin:  Warm, no rashes     Diabetic education: ongoing education regarding chronic disease management for diabetes was given today. We continue to reinforce the ABC's of diabetic management: A1c (<7 or 8 dependent  upon patient), tight blood pressure control, and cholesterol management with goal LDL < 100 minimally. We discuss diet strategies, exercise recommendations, medication options and possible side effects. At each visit, we review recommended immunizations and preventive care recommendations for diabetics and stress that good diabetic control can prevent other problems. See below for this patient's data.    Commons side effects, risks, benefits, and alternatives for medications and treatment plan prescribed today were discussed, and the patient expressed understanding of the given instructions. Patient is instructed to call or message via MyChart if he/she has any questions or concerns regarding our treatment plan. No  barriers to understanding were identified. We discussed Red Flag symptoms and signs in detail. Patient expressed understanding regarding what to do in case of urgent or emergency type symptoms.   Medication list was reconciled, printed and provided to the patient in AVS. Patient instructions and summary information was reviewed with the patient as documented in the AVS. This note was prepared with assistance of Dragon voice recognition software. Occasional wrong-word or sound-a-like substitutions may have occurred due to the inherent limitations of voice recognition software

## 2019-04-05 NOTE — Patient Instructions (Addendum)
Please return in 6 months for your annual complete physical; please come fasting. Also can schedule your Annual Wellness Visit in February with Shannon Hodge.   Everything looks good. Continue your current medications as is w/o change.   Today you were given your flu vaccination.    If you have any questions or concerns, please don't hesitate to send me a message via MyChart or call the office at [phone]. Thank you for visiting with [place] today! It's our pleasure caring for you.

## 2019-04-05 NOTE — Addendum Note (Signed)
Addended by: Francis Dowse T on: 04/05/2019 08:50 AM   Modules accepted: Orders

## 2019-04-05 NOTE — Addendum Note (Signed)
Addended by: Layla Barter on: 04/05/2019 09:26 AM   Modules accepted: Orders

## 2019-04-09 LAB — ABO AND RH

## 2019-04-12 ENCOUNTER — Other Ambulatory Visit: Payer: Self-pay | Admitting: Family Medicine

## 2019-05-02 ENCOUNTER — Telehealth: Payer: Self-pay | Admitting: Family Medicine

## 2019-05-02 NOTE — Telephone Encounter (Signed)
Spoke with patient and informed him of NP's message. He asked if referring him for sleep is the primary care's "job". I advised not necessarily but after he discusses statin medication, cramps and the PCP feels a sleep evaluation might be needed, he can refer. I also offered a sooner FU. He stated he would talk to PCP, call back if needed, verbalized understanding, appreciation.

## 2019-05-02 NOTE — Telephone Encounter (Signed)
I called pt.  He continues with cramping, stiffness at night.  On gabapentin 900mg  po BID.  Tried baclofen 3 nights in row and next day too groggy so he is not taking.  He states when has cramping he gets up and walks and this is helpful.  He is asking because of stiffness, cramping, is causing interrupted sleep (no problem going to sleep) but will be awakened 1-5 hours after going to sleep.  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 Or both.  Coq10 very minimal effect as baclofen.  Taking supplements (vit b, magnesium, D3).   What recommendations? (he mentioned sleep consult)  Please advise.

## 2019-05-02 NOTE — Telephone Encounter (Signed)
When I saw him in June, he was doing ok but did note cramping after increasing statin. I would have him follow up with PCP regarding the cramps. I do not think that cramping would be related to neuropathy. PCP may want to refer him to sleep med but maybe finding an answer for the cramps would be beneficial for improved sleep. I am happy to see him in the office if neuropathy seems to be worse.

## 2019-05-02 NOTE — Telephone Encounter (Signed)
Pt called in and stated that he is wanting a referral to the sleep lab , and he is requesting a call back to discuss  CB# [phone]

## 2019-05-08 DIAGNOSIS — M48061 Spinal stenosis, lumbar region without neurogenic claudication: Secondary | ICD-10-CM | POA: Diagnosis not present

## 2019-05-22 ENCOUNTER — Telehealth: Payer: Self-pay | Admitting: *Deleted

## 2019-05-22 MED ORDER — LISINOPRIL 20 MG PO TABS: 20.0000 mg | ORAL_TABLET | Freq: Every day | ORAL | 3 refills | Status: DC

## 2019-05-22 MED ORDER — FENOFIBRATE MICRONIZED 134 MG PO CAPS: 134.0000 mg | ORAL_CAPSULE | Freq: Every day | ORAL | 3 refills | Status: DC

## 2019-05-27 ENCOUNTER — Encounter: Payer: Self-pay | Admitting: *Deleted

## 2019-05-27 ENCOUNTER — Other Ambulatory Visit: Payer: Self-pay | Admitting: Orthopedic Surgery

## 2019-05-27 DIAGNOSIS — M545 Low back pain, unspecified: Secondary | ICD-10-CM

## 2019-05-27 NOTE — Telephone Encounter (Signed)
See note

## 2019-05-27 NOTE — Telephone Encounter (Signed)
Patient called in and wants office to know that they are needing a PA sent in from PCP before they can go ahead and refill these two medications. Please advise.

## 2019-05-27 NOTE — Telephone Encounter (Signed)
MyChart sent to pt to make them aware to have pharmacy send PA request so it can be taking care of

## 2019-06-09 ENCOUNTER — Other Ambulatory Visit: Payer: Medicare Other

## 2019-06-19 ENCOUNTER — Other Ambulatory Visit: Payer: Self-pay

## 2019-06-19 ENCOUNTER — Ambulatory Visit
Admission: RE | Admit: 2019-06-19 | Discharge: 2019-06-19 | Disposition: A | Discharge status: Home / Self Care | Payer: Medicare Other | Source: Ambulatory Visit | Attending: Orthopedic Surgery | Admitting: Orthopedic Surgery

## 2019-06-19 DIAGNOSIS — M48061 Spinal stenosis, lumbar region without neurogenic claudication: Secondary | ICD-10-CM | POA: Diagnosis not present

## 2019-06-19 DIAGNOSIS — M545 Low back pain, unspecified: Secondary | ICD-10-CM

## 2019-07-10 DIAGNOSIS — M545 Low back pain: Secondary | ICD-10-CM | POA: Diagnosis not present

## 2019-07-10 DIAGNOSIS — M5136 Other intervertebral disc degeneration, lumbar region: Secondary | ICD-10-CM | POA: Diagnosis not present

## 2019-07-15 ENCOUNTER — Encounter: Payer: Self-pay | Admitting: Family Medicine

## 2019-07-30 ENCOUNTER — Other Ambulatory Visit: Payer: Self-pay

## 2019-07-30 DIAGNOSIS — E782 Mixed hyperlipidemia: Secondary | ICD-10-CM

## 2019-07-30 DIAGNOSIS — E1142 Type 2 diabetes mellitus with diabetic polyneuropathy: Secondary | ICD-10-CM

## 2019-07-30 MED ORDER — METFORMIN HCL ER 500 MG PO TB24: 2000.0000 mg | ORAL_TABLET | Freq: Every day | ORAL | 3 refills | Status: DC

## 2019-07-30 MED ORDER — FENOFIBRATE MICRONIZED 134 MG PO CAPS: 134.0000 mg | ORAL_CAPSULE | Freq: Every day | ORAL | 3 refills | Status: DC

## 2019-08-03 ENCOUNTER — Encounter: Payer: Self-pay | Admitting: Family Medicine

## 2019-08-09 ENCOUNTER — Other Ambulatory Visit: Payer: Self-pay

## 2019-08-09 DIAGNOSIS — I10 Essential (primary) hypertension: Secondary | ICD-10-CM

## 2019-08-09 MED ORDER — LISINOPRIL 20 MG PO TABS: 20.0000 mg | ORAL_TABLET | Freq: Every day | ORAL | 3 refills | Status: DC

## 2019-08-13 ENCOUNTER — Telehealth: Payer: Self-pay | Admitting: Family Medicine

## 2019-08-13 ENCOUNTER — Other Ambulatory Visit: Payer: Self-pay | Admitting: Diagnostic Neuroimaging

## 2019-08-13 DIAGNOSIS — E1142 Type 2 diabetes mellitus with diabetic polyneuropathy: Secondary | ICD-10-CM

## 2019-08-13 MED ORDER — GABAPENTIN 600 MG PO TABS: 900.0000 mg | ORAL_TABLET | Freq: Two times a day (BID) | ORAL | 4 refills | Status: DC

## 2019-08-13 NOTE — Telephone Encounter (Signed)
I called and spoke to Kim at [facility] and cancelled the gabapentin prescription there and redid at [facility] [place] per pt request.

## 2019-08-13 NOTE — Telephone Encounter (Signed)
Pt called stating that due to plan change he is needing his gabapentin (NEURONTIN) 600 MG tablet changed from [place] [facility] [place] [address] Please advise.

## 2019-08-22 ENCOUNTER — Other Ambulatory Visit: Payer: Self-pay | Admitting: Family Medicine

## 2019-08-22 DIAGNOSIS — M48061 Spinal stenosis, lumbar region without neurogenic claudication: Secondary | ICD-10-CM | POA: Diagnosis not present

## 2019-08-22 DIAGNOSIS — M545 Low back pain: Secondary | ICD-10-CM | POA: Diagnosis not present

## 2019-08-22 DIAGNOSIS — M5136 Other intervertebral disc degeneration, lumbar region: Secondary | ICD-10-CM | POA: Diagnosis not present

## 2019-08-23 ENCOUNTER — Other Ambulatory Visit: Payer: Self-pay

## 2019-08-23 DIAGNOSIS — I10 Essential (primary) hypertension: Secondary | ICD-10-CM

## 2019-08-23 MED ORDER — LISINOPRIL 20 MG PO TABS: 20.0000 mg | ORAL_TABLET | Freq: Every day | ORAL | 3 refills | Status: DC

## 2019-08-29 DIAGNOSIS — Z20828 Contact with and (suspected) exposure to other viral communicable diseases: Secondary | ICD-10-CM | POA: Diagnosis not present

## 2019-08-29 DIAGNOSIS — Z03818 Encounter for observation for suspected exposure to other biological agents ruled out: Secondary | ICD-10-CM | POA: Diagnosis not present

## 2019-09-08 ENCOUNTER — Ambulatory Visit: Payer: Medicare Other

## 2019-09-23 ENCOUNTER — Ambulatory Visit: Payer: Medicare Other

## 2019-09-25 ENCOUNTER — Encounter: Payer: Self-pay | Admitting: Family Medicine

## 2019-09-26 ENCOUNTER — Ambulatory Visit (INDEPENDENT_AMBULATORY_CARE_PROVIDER_SITE_OTHER): Payer: Medicare Other | Admitting: Diagnostic Neuroimaging

## 2019-09-26 ENCOUNTER — Encounter (INDEPENDENT_AMBULATORY_CARE_PROVIDER_SITE_OTHER): Payer: Medicare Other | Admitting: Diagnostic Neuroimaging

## 2019-09-26 ENCOUNTER — Other Ambulatory Visit: Payer: Self-pay

## 2019-09-26 DIAGNOSIS — R200 Anesthesia of skin: Secondary | ICD-10-CM

## 2019-09-26 DIAGNOSIS — Z0289 Encounter for other administrative examinations: Secondary | ICD-10-CM

## 2019-10-02 NOTE — Procedures (Signed)
[facility]  NCS (NERVE CONDUCTION STUDY) WITH EMG (ELECTROMYOGRAPHY) REPORT   STUDY DATE: 09/26/19 PATIENT NAME: Shannon Hodge DOB: October 12, 1945 MRN: [id number]  ORDERING CLINICIAN: Debbora Presto, NP   TECHNOLOGIST: Sherre Scarlet ELECTROMYOGRAPHER: Earlean Polka. Nicodemus Denk, MD  CLINICAL INFORMATION: 74 year old male with numbness and pain.  FINDINGS: NERVE CONDUCTION STUDY: Bilateral peroneal and tibial motor responses of normal distal latencies, decreased abilities and mildly slow conduction velocities.  Bilateral sural and right superficial peroneal sensory responses have prolonged peak latencies and decreased amplitudes.  Left superficial peroneal sensory response could not be obtained.  Bilateral tibial F wave latencies are prolonged.   NEEDLE ELECTROMYOGRAPHY:  Needle examination of right lower extremity vastus medialis, tibialis anterior, gastrocnemius notable for decreased motor unit treatment in the right gastrocnemius on exertion.  No abnormal spontaneous activity at rest.   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    INTERPRETING PHYSICIAN:  Penni Bombard, MD Certified in Neurology, Neurophysiology and Neuroimaging  [facility] [address] [place], [place] [address] [phone]  [facility]    Nerve / Sites Muscle Latency Ref. Amplitude Ref. Rel Amp Segments Distance Velocity Ref. Area    ms ms mV mV %  cm m/s m/s mVms  R Peroneal - EDB     Ankle EDB 4.6 ?6.5 4.4 ?2.0 100 Ankle - EDB 9   14.6     Fib head EDB 12.0  3.8  87.7 Fib head - Ankle 30 41 ?44 14.2     Pop fossa EDB 14.7  3.5  91.1 Pop fossa - Fib head 10 38 ?44 13.8         Pop fossa - Ankle      L Peroneal - EDB     Ankle EDB 4.7 ?6.5 4.0 ?2.0 100 Ankle - EDB 9   11.5     Fib head EDB 12.5  3.0  75.1 Fib head - Ankle 30 38 ?44 9.9     Pop fossa EDB 15.0  3.2  106 Pop fossa - Fib head 10 40 ?44 11.3         Pop fossa  - Ankle      R Tibial - AH     Ankle AH 4.1 ?5.8 7.6 ?4.0 100 Ankle - AH 9   17.0     Pop fossa AH 15.4  5.3  69.3 Pop fossa - Ankle 41 36 ?41 14.0  L Tibial - AH     Ankle AH 4.3 ?5.8 6.8 ?4.0 100 Ankle - AH 9   21.2     Pop fossa AH 14.9  4.6  67.3 Pop fossa - Ankle 40 38 ?41 23.3             SNC    Nerve / Sites Rec. Site Peak Lat Ref.  Amp Ref. Segments Distance    ms ms V V  cm  R Sural - Ankle (Calf)     Calf Ankle 4.7 ?4.4 2 ?6 Calf - Ankle 14  L Sural - Ankle (Calf)     Calf Ankle 5.7 ?4.4 3 ?6 Calf - Ankle 14  R Superficial peroneal - Ankle     Lat leg Ankle 4.9 ?4.4 2 ?6 Lat leg - Ankle 14  L Superficial peroneal - Ankle     Lat leg Ankle NR ?4.4 NR ?6 Lat leg - Ankle 14  F  Wave    Nerve F Lat Ref.   ms ms  R Tibial - AH 57.5 ?56.0  L Tibial - AH 56.4 ?56.0         EMG Summary Table    Spontaneous MUAP Recruitment  Muscle IA Fib PSW Fasc Other Amp Dur. Poly Pattern  R. Vastus medialis Normal None None None _______ Normal Normal Normal Normal  R. Tibialis anterior Normal None None None _______ Normal Normal Normal Normal  R. Gastrocnemius (Medial head) Normal None None None _______ Normal Normal 1+ Reduced

## 2019-10-07 ENCOUNTER — Other Ambulatory Visit: Payer: Self-pay

## 2019-10-07 ENCOUNTER — Ambulatory Visit (INDEPENDENT_AMBULATORY_CARE_PROVIDER_SITE_OTHER): Payer: Medicare Other

## 2019-10-07 ENCOUNTER — Ambulatory Visit (INDEPENDENT_AMBULATORY_CARE_PROVIDER_SITE_OTHER): Payer: Medicare Other | Admitting: Family Medicine

## 2019-10-07 ENCOUNTER — Encounter: Payer: Self-pay | Admitting: Family Medicine

## 2019-10-07 VITALS — BP 138/74 | Temp 97.6°F | Ht 72.0 in | Wt 175.3 lb

## 2019-10-07 VITALS — BP 138/74 | HR 57 | Temp 97.6°F | Ht 72.0 in | Wt 175.2 lb

## 2019-10-07 DIAGNOSIS — E538 Deficiency of other specified B group vitamins: Secondary | ICD-10-CM | POA: Diagnosis not present

## 2019-10-07 DIAGNOSIS — M5416 Radiculopathy, lumbar region: Secondary | ICD-10-CM

## 2019-10-07 DIAGNOSIS — R7989 Other specified abnormal findings of blood chemistry: Secondary | ICD-10-CM

## 2019-10-07 DIAGNOSIS — E1142 Type 2 diabetes mellitus with diabetic polyneuropathy: Secondary | ICD-10-CM

## 2019-10-07 DIAGNOSIS — R252 Cramp and spasm: Secondary | ICD-10-CM

## 2019-10-07 DIAGNOSIS — Z0000 Encounter for general adult medical examination without abnormal findings: Secondary | ICD-10-CM | POA: Diagnosis not present

## 2019-10-07 DIAGNOSIS — E782 Mixed hyperlipidemia: Secondary | ICD-10-CM | POA: Diagnosis not present

## 2019-10-07 DIAGNOSIS — G2581 Restless legs syndrome: Secondary | ICD-10-CM

## 2019-10-07 DIAGNOSIS — F5101 Primary insomnia: Secondary | ICD-10-CM | POA: Diagnosis not present

## 2019-10-07 LAB — CBC WITH DIFFERENTIAL/PLATELET
Basophils Absolute: 0 10*3/uL (ref 0.0–0.1)
Basophils Relative: 0.7 % (ref 0.0–3.0)
Eosinophils Absolute: 0.2 10*3/uL (ref 0.0–0.7)
Eosinophils Relative: 4.1 % (ref 0.0–5.0)
HCT: 38.5 % — ABNORMAL LOW (ref 39.0–52.0)
Hemoglobin: 13.5 g/dL (ref 13.0–17.0)
Lymphocytes Relative: 25.3 % (ref 12.0–46.0)
Lymphs Abs: 1.2 10*3/uL (ref 0.7–4.0)
MCHC: 35 g/dL (ref 30.0–36.0)
MCV: 92.5 fl (ref 78.0–100.0)
Monocytes Absolute: 0.3 10*3/uL (ref 0.1–1.0)
Monocytes Relative: 6.6 % (ref 3.0–12.0)
Neutro Abs: 2.9 10*3/uL (ref 1.4–7.7)
Neutrophils Relative %: 63.3 % (ref 43.0–77.0)
Platelets: 187 10*3/uL (ref 150.0–400.0)
RBC: 4.16 Mil/uL — ABNORMAL LOW (ref 4.22–5.81)
RDW: 12.6 % (ref 11.5–15.5)
WBC: 4.6 10*3/uL (ref 4.0–10.5)

## 2019-10-07 LAB — COMPREHENSIVE METABOLIC PANEL
ALT: 24 U/L (ref 0–53)
AST: 20 U/L (ref 0–37)
Albumin: 4.5 g/dL (ref 3.5–5.2)
Alkaline Phosphatase: 42 U/L (ref 39–117)
BUN: 25 mg/dL — ABNORMAL HIGH (ref 6–23)
CO2: 32 mEq/L (ref 19–32)
Calcium: 9.6 mg/dL (ref 8.4–10.5)
Chloride: 103 mEq/L (ref 96–112)
Creatinine, Ser: 1.17 mg/dL (ref 0.40–1.50)
GFR: 61.42 mL/min (ref >60.00)
Glucose, Bld: 106 mg/dL — ABNORMAL HIGH (ref 70–99)
Potassium: 4.3 mEq/L (ref 3.5–5.1)
Sodium: 141 mEq/L (ref 135–145)
Total Bilirubin: 0.9 mg/dL (ref 0.2–1.2)
Total Protein: 6.8 g/dL (ref 6.0–8.3)

## 2019-10-07 LAB — TSH: TSH: 1.16 u[IU]/mL (ref 0.35–4.50)

## 2019-10-07 LAB — LIPID PANEL
Cholesterol: 146 mg/dL (ref 0–200)
HDL: 58.2 mg/dL (ref >39.00)
LDL Cholesterol: 71 mg/dL (ref 0–99)
NonHDL: 88.06
Total CHOL/HDL Ratio: 3
Triglycerides: 86 mg/dL (ref 0.0–149.0)
VLDL: 17.2 mg/dL (ref 0.0–40.0)

## 2019-10-07 LAB — POCT GLYCOSYLATED HEMOGLOBIN (HGB A1C): Hemoglobin A1C: 6.1 % — AB (ref 4.0–5.6)

## 2019-10-07 LAB — MICROALBUMIN / CREATININE URINE RATIO
Creatinine,U: 31.8 mg/dL
Microalb Creat Ratio: 2.2 mg/g (ref 0.0–30.0)
Microalb, Ur: <0.7 mg/dL (ref 0.0–1.9)

## 2019-10-07 LAB — VITAMIN B12: Vitamin B-12: 755 pg/mL (ref 211–911)

## 2019-10-07 NOTE — Patient Instructions (Signed)
[name] , Thank you for taking time to come for your Medicare Wellness Visit. I appreciate your ongoing commitment to your health goals. Please review the following plan we discussed and let me know if I can assist you in the future.   Screening recommendations/referrals: Colorectal Screening: up to date; last 11/01/16   Vision and Dental Exams: Recommended annual ophthalmology exams for early detection of glaucoma and other disorders of the eye Recommended annual dental exams for proper oral hygiene  Diabetic Exams: Diabetic Eye Exam: recommended yearly Diabetic Foot Exam: recommended yearly   Vaccinations: Influenza vaccine: completed 04/05/19 Pneumococcal vaccine: up to date; last 09/21/17  Tdap vaccine: up to date; last 01/27/11 Shingles vaccine: Shingrix completed   Advanced directives: Please bring a copy of your POA (Power of [place]) and/or Living Will to your next appointment.  Goals: Recommend to drink at least 6-8 8oz glasses of water per day and consume a balanced diet rich in fresh fruits and vegetables.   Next appointment: Please schedule your Annual Wellness Visit with your Nurse Health Advisor in one year.  Preventive Care 43 Years and Older, Male Preventive care refers to lifestyle choices and visits with your health care provider that can promote health and wellness. What does preventive care include?  A yearly physical exam. This is also called an annual well check.  Dental exams once or twice a year.  Routine eye exams. Ask your health care provider how often you should have your eyes checked.  Personal lifestyle choices, including:  Daily care of your teeth and gums.  Regular physical activity.  Eating a healthy diet.  Avoiding tobacco and drug use.  Limiting alcohol use.  Practicing safe sex.  Taking low doses of aspirin every day if recommended by your health care provider..  Taking vitamin and mineral supplements as recommended by your health  care provider. What happens during an annual well check? The services and screenings done by your health care provider during your annual well check will depend on your age, overall health, lifestyle risk factors, and family history of disease. Counseling  Your health care provider may ask you questions about your:  Alcohol use.  Tobacco use.  Drug use.  Emotional well-being.  Home and relationship well-being.  Sexual activity.  Eating habits.  History of falls.  Memory and ability to understand (cognition).  Work and work Statistician. Screening  You may have the following tests or measurements:  Height, weight, and BMI.  Blood pressure.  Lipid and cholesterol levels. These may be checked every 5 years, or more frequently if you are over 77 years old.  Skin check.  Lung cancer screening. You may have this screening every year starting at age 52 if you have a 30-pack-year history of smoking and currently smoke or have quit within the past 15 years.  Fecal occult blood test (FOBT) of the stool. You may have this test every year starting at age 29.  Flexible sigmoidoscopy or colonoscopy. You may have a sigmoidoscopy every 5 years or a colonoscopy every 10 years starting at age 48.  Prostate cancer screening. Recommendations will vary depending on your family history and other risks.  Hepatitis C blood test.  Hepatitis B blood test.  Sexually transmitted disease (STD) testing.  Diabetes screening. This is done by checking your blood sugar (glucose) after you have not eaten for a while (fasting). You may have this done every 1-3 years.  Abdominal aortic aneurysm (AAA) screening. You may need this if  you are a current or former smoker.  Osteoporosis. You may be screened starting at age 70 if you are at high risk. Talk with your health care provider about your test results, treatment options, and if necessary, the need for more tests. Vaccines  Your health care  provider may recommend certain vaccines, such as:  Influenza vaccine. This is recommended every year.  Tetanus, diphtheria, and acellular pertussis (Tdap, Td) vaccine. You may need a Td booster every 10 years.  Zoster vaccine. You may need this after age 21.  Pneumococcal 13-valent conjugate (PCV13) vaccine. One dose is recommended after age 49.  Pneumococcal polysaccharide (PPSV23) vaccine. One dose is recommended after age 90. Talk to your health care provider about which screenings and vaccines you need and how often you need them. This information is not intended to replace advice given to you by your health care provider. Make sure you discuss any questions you have with your health care provider. Document Released: 08/14/2015 Document Revised: 04/06/2016 Document Reviewed: 05/19/2015 Elsevier Interactive Patient Education  2017 [place] Prevention in the Home Falls can cause injuries. They can happen to people of all ages. There are many things you can do to make your home safe and to help prevent falls. What can I do on the outside of my home?  Regularly fix the edges of walkways and driveways and fix any cracks.  Remove anything that might make you trip as you walk through a door, such as a raised step or threshold.  Trim any bushes or trees on the path to your home.  Use bright outdoor lighting.  Clear any walking paths of anything that might make someone trip, such as rocks or tools.  Regularly check to see if handrails are loose or broken. Make sure that both sides of any steps have handrails.  Any raised decks and porches should have guardrails on the edges.  Have any leaves, snow, or ice cleared regularly.  Use sand or salt on walking paths during winter.  Clean up any spills in your garage right away. This includes oil or grease spills. What can I do in the bathroom?  Use night lights.  Install grab bars by the toilet and in the tub and shower. Do  not use towel bars as grab bars.  Use non-skid mats or decals in the tub or shower.  If you need to sit down in the shower, use a plastic, non-slip stool.  Keep the floor dry. Clean up any water that spills on the floor as soon as it happens.  Remove soap buildup in the tub or shower regularly.  Attach bath mats securely with double-sided non-slip rug tape.  Do not have throw rugs and other things on the floor that can make you trip. What can I do in the bedroom?  Use night lights.  Make sure that you have a light by your bed that is easy to reach.  Do not use any sheets or blankets that are too big for your bed. They should not hang down onto the floor.  Have a firm chair that has side arms. You can use this for support while you get dressed.  Do not have throw rugs and other things on the floor that can make you trip. What can I do in the kitchen?  Clean up any spills right away.  Avoid walking on wet floors.  Keep items that you use a lot in easy-to-reach places.  If you need to  reach something above you, use a strong step stool that has a grab bar.  Keep electrical cords out of the way.  Do not use floor polish or wax that makes floors slippery. If you must use wax, use non-skid floor wax.  Do not have throw rugs and other things on the floor that can make you trip. What can I do with my stairs?  Do not leave any items on the stairs.  Make sure that there are handrails on both sides of the stairs and use them. Fix handrails that are broken or loose. Make sure that handrails are as long as the stairways.  Check any carpeting to make sure that it is firmly attached to the stairs. Fix any carpet that is loose or worn.  Avoid having throw rugs at the top or bottom of the stairs. If you do have throw rugs, attach them to the floor with carpet tape.  Make sure that you have a light switch at the top of the stairs and the bottom of the stairs. If you do not have them,  ask someone to add them for you. What else can I do to help prevent falls?  Wear shoes that:  Do not have high heels.  Have rubber bottoms.  Are comfortable and fit you well.  Are closed at the toe. Do not wear sandals.  If you use a stepladder:  Make sure that it is fully opened. Do not climb a closed stepladder.  Make sure that both sides of the stepladder are locked into place.  Ask someone to hold it for you, if possible.  Clearly mark and make sure that you can see:  Any grab bars or handrails.  First and last steps.  Where the edge of each step is.  Use tools that help you move around (mobility aids) if they are needed. These include:  Canes.  Walkers.  Scooters.  Crutches.  Turn on the lights when you go into a dark area. Replace any light bulbs as soon as they burn out.  Set up your furniture so you have a clear path. Avoid moving your furniture around.  If any of your floors are uneven, fix them.  If there are any pets around you, be aware of where they are.  Review your medicines with your doctor. Some medicines can make you feel dizzy. This can increase your chance of falling. Ask your doctor what other things that you can do to help prevent falls. This information is not intended to replace advice given to you by your health care provider. Make sure you discuss any questions you have with your health care provider. Document Released: 05/14/2009 Document Revised: 12/24/2015 Document Reviewed: 08/22/2014 Elsevier Interactive Patient Education  2017 [employer].

## 2019-10-07 NOTE — Progress Notes (Signed)
Subjective:   Shannon Hodge is a 74 y.o. male who presents for Medicare Annual/Subsequent preventive examination.  Review of Systems:   Cardiac Risk Factors include: advanced age (>92men, >30 women);hypertension;male gender;dyslipidemia;diabetes mellitus    Objective:    Vitals: BP 138/74   Temp 97.6 F (36.4 C) (Temporal)   Ht 6' (1.829 m)   Wt 175 lb 4.3 oz (79.5 kg)   BMI 23.77 kg/m   Body mass index is 23.77 kg/m.  Advanced Directives 10/07/2019 09/05/2018 11/01/2016 10/27/2016 11/23/2015 05/20/2015 11/05/2014  Does Patient Have a Medical Advance Directive? Yes Yes Yes Yes Yes Yes No;Yes  Type of Advance Directive Living will;Healthcare Power of [place];Living will [place];Living will [place];Living will [place];Living will [place];Living will [place];Living will  Does patient want to make changes to medical advance directive? No - Patient declined - - No - Patient declined - - Yes - information given  Copy of [place] in Chart? No - copy requested No - copy requested No - copy requested - - No - copy requested No - copy requested  Would patient like information on creating a medical advance directive? - - - - - - Yes - Scientist, clinical (histocompatibility and immunogenetics) given    Tobacco Social History   Tobacco Use  Smoking Status Never Smoker  Smokeless Tobacco Never Used     Counseling given: Not Answered   Clinical Intake:  Pre-visit preparation completed: Yes  Pain : No/denies pain  Diabetes: Yes CBG done?: Yes CBG resulted in Enter/ Edit results?: Yes Did pt. bring in CBG monitor from home?: No  How often do you need to have someone help you when you read instructions, pamphlets, or other written materials from your doctor or pharmacy?: 1 - Never  Interpreter Needed?: No  Information entered by :: Denman George LPN  Past Medical  History:  Diagnosis Date  . Diabetes ([place])    type 2  . Diabetic neuropathy ([facility])    both legs  . DJD (degenerative joint disease)    L 5  . Hypercholesteremia   . Hyperlipidemia   . Hypertension   . Neuropathy   . Ruptured Achilles tendon due to trauma 10/2016   Past Surgical History:  Procedure Laterality Date  . COLONOSCOPY WITH PROPOFOL N/A 11/01/2016   Procedure: COLONOSCOPY WITH PROPOFOL;  Surgeon: Garlan Fair, MD;  Location: [facility];  Service: Endoscopy;  Laterality: N/A;  . colonscopy     x 2  . ESOPHAGOGASTRODUODENOSCOPY (EGD) WITH PROPOFOL N/A 11/01/2016   Procedure: ESOPHAGOGASTRODUODENOSCOPY (EGD) WITH PROPOFOL;  Surgeon: Garlan Fair, MD;  Location: [facility];  Service: Endoscopy;  Laterality: N/A;  . TONSILLECTOMY     Family History  Problem Relation Age of Onset  . Pancreatic cancer Mother   . Stroke Father   . Brain cancer Father   . Diabetes Father   . Healthy Daughter   . Healthy Son   . Diabetes Maternal Grandmother   . Diabetes Maternal Grandfather   . Diabetes Paternal Grandmother   . Diabetes Paternal Grandfather    Social History   Socioeconomic History  . Marital status: Married    Spouse name: Diane   . Number of children: 3  . Years of education: post grad  . Highest education level: Not on file  Occupational History  . Occupation: retired English as a second language teacher  Tobacco Use  . Smoking status: Never  Smoker  . Smokeless tobacco: Never Used  Substance and Sexual Activity  . Alcohol use: No    Alcohol/week: 0.0 standard drinks  . Drug use: No  . Sexual activity: Yes  Other Topics Concern  . Not on file  Social History Narrative   Lives with wife    Drinks 2 cups of coffee a day   Social Determinants of Health   Financial Resource Strain:   . Difficulty of Paying Living Expenses: Not on file  Food Insecurity:   . Worried About Charity fundraiser in the Last Year: Not on file  . Ran Out of Food in the Last Year: Not on file    Transportation Needs:   . Lack of Transportation (Medical): Not on file  . Lack of Transportation (Non-Medical): Not on file  Physical Activity:   . Days of Exercise per Week: Not on file  . Minutes of Exercise per Session: Not on file  Stress:   . Feeling of Stress : Not on file  Social Connections:   . Frequency of Communication with Friends and Family: Not on file  . Frequency of Social Gatherings with Friends and Family: Not on file  . Attends Religious Services: Not on file  . Active Member of Clubs or Organizations: Not on file  . Attends Archivist Meetings: Not on file  . Marital Status: Not on file    Outpatient Encounter Medications as of 10/07/2019  Medication Sig  . acetaminophen (TYLENOL) 500 MG tablet Take 500 mg by mouth daily as needed for moderate pain or headache.  . APPLE CIDER VINEGAR PO Take by mouth.  Marland Kitchen aspirin 325 MG tablet Take 162.5 mg by mouth daily.   . cholecalciferol (VITAMIN D) 1000 units tablet Take 1,000 Units by mouth daily.  . Coenzyme Q10 (COQ10) 100 MG CAPS   . Cyanocobalamin (VITAMIN B-12 PO) Take by mouth daily.  . fenofibrate micronized (LOFIBRA) 134 MG capsule Take 1 capsule (134 mg total) by mouth daily before breakfast.  . gabapentin (NEURONTIN) 600 MG tablet Take 1.5 tablets (900 mg total) by mouth 2 (two) times daily.  Marland Kitchen lisinopril (ZESTRIL) 20 MG tablet Take 1 tablet (20 mg total) by mouth daily.  . Magnesium Oxide (MAG-200) 200 MG TABS Take 400 mg by mouth daily.   . metFORMIN (GLUCOPHAGE-XR) 500 MG 24 hr tablet Take 4 tablets (2,000 mg total) by mouth daily.  Marland Kitchen pyridOXINE (VITAMIN B-6) 100 MG tablet Take 100 mg by mouth every evening.  . simvastatin (ZOCOR) 40 MG tablet TAKE 1 TABLET BY MOUTH EVERYDAY AT BEDTIME  . valACYclovir (VALTREX) 500 MG tablet Take 500 mg by mouth daily as needed (outbreak).    No facility-administered encounter medications on file as of 10/07/2019.    Activities of Daily Living In your present state  of health, do you have any difficulty performing the following activities: 10/07/2019  Hearing? Y  Comment wears bilateral hearing aids  Vision? N  Difficulty concentrating or making decisions? N  Walking or climbing stairs? N  Dressing or bathing? N  Doing errands, shopping? N  Preparing Food and eating ? N  Using the Toilet? N  In the past six months, have you accidently leaked urine? N  Do you have problems with loss of bowel control? N  Managing your Medications? N  Managing your Finances? N  Housekeeping or managing your Housekeeping? N  Some recent data might be hidden    Patient Care Team: [name],  MD as PCP - General (Family Medicine) Leta Baptist, Earlean Polka, MD as Consulting Physician (Neurology) Garlan Fair, MD as Consulting Physician (Gastroenterology) Irine Seal, MD as Attending Physician (Urology) Marchia Bond, MD as Consulting Physician (Orthopedic Surgery) Debbora Presto, NP as Nurse Practitioner (Neurology) Otelia Sergeant, OD as Consulting Physician (Optometry)   Assessment:   This is a routine wellness examination for [place].  Exercise Activities and Dietary recommendations Current Exercise Habits: Home exercise routine, Type of exercise: walking;treadmill, Time (Minutes): 30, Frequency (Times/Week): 4, Weekly Exercise (Minutes/Week): 120, Intensity: Mild  Goals    . Patient Stated     Maintain current health status staying active.        Fall Risk Fall Risk  10/07/2019 10/07/2019 09/05/2018 09/21/2017 11/23/2015  Falls in the past year? 0 0 0 No No  Number falls in past yr: 0 - - - -  Injury with Fall? 0 - - - -  Follow up Falls evaluation completed;Education provided;Falls prevention discussed Falls evaluation completed - - -   Is the patient's home free of loose throw rugs in walkways, pet beds, electrical cords, etc?   yes      Grab bars in the bathroom? yes      Handrails on the stairs?   yes      Adequate lighting?   yes  Timed Get Up and Go  Performed: completed and within normal timeframe; no gait abnormalities noted   Depression Screen PHQ 2/9 Scores 10/07/2019 10/07/2019 09/05/2018 09/21/2017  PHQ - 2 Score 0 0 0 0  PHQ- 9 Score - - - 0    Cognitive Function MMSE - Mini Mental State Exam 09/05/2018  Orientation to time 5  Orientation to Place 5  Registration 3  Attention/ Calculation 5  Recall 3  Language- name 2 objects 2  Language- repeat 1  Language- follow 3 step command 3  Language- read & follow direction 1  Write a sentence 1  Copy design 1  Total score 30     6CIT Screen 10/07/2019  What Year? 0 points  What month? 0 points  What time? 0 points  Count back from 20 0 points  Months in reverse 0 points  Repeat phrase 0 points  Total Score 0    Immunization History  Administered Date(s) Administered  . Influenza, High Dose Seasonal PF 05/09/2016, 05/04/2017, 04/12/2018  . Influenza,inj,Quad PF,6+ Mos 04/05/2019  . Influenza-Unspecified 05/01/2015  . Pneumococcal Conjugate-13 10/03/2016  . Pneumococcal Polysaccharide-23 01/27/2011, 09/21/2017  . Tdap 01/27/2011  . Zoster 01/19/2010  . Zoster Recombinat (Shingrix) 06/25/2018, 09/06/2018    Qualifies for Shingles Vaccine? Shingrix completed   Screening Tests Health Maintenance  Topic Date Due  . OPHTHALMOLOGY EXAM  03/01/2020  . HEMOGLOBIN A1C  04/08/2020  . FOOT EXAM  10/06/2020  . TETANUS/TDAP  01/26/2021  . COLONOSCOPY  11/02/2026  . INFLUENZA VACCINE  Completed  . Hepatitis C Screening  Completed  . PNA vac Low Risk Adult  Completed   Cancer Screenings: Lung: Low Dose CT Chest recommended if Age 34-80 years, 30 pack-year currently smoking OR have quit w/in 15years. Patient does not qualify. Colorectal: colonoscopy 11/01/16      Plan:  I have personally reviewed and addressed the Medicare Annual Wellness questionnaire and have noted the following in the patient's chart:  A. Medical and social history B. Use of alcohol, tobacco or illicit  drugs  C. Current medications and supplements D. Functional ability and status E.  Nutritional status F.  Physical activity G. Advance directives H. List of other physicians I.  Hospitalizations, surgeries, and ER visits in previous 12 months J.  [place] such as hearing and vision if needed, cognitive and depression L. Referrals, records requested, and appointments- none   In addition, I have reviewed and discussed with patient certain preventive protocols, quality metrics, and best practice recommendations. A written personalized care plan for preventive services as well as general preventive health recommendations were provided to patient.   Signed,  Denman George, LPN  Nurse Health Advisor   Nurse Notes: Patient has completed Covid vaccine

## 2019-10-07 NOTE — Patient Instructions (Addendum)
Please return in 6 months for diabetes follow up  I will release your lab results to you on your MyChart account with further instructions. Please reply with any questions.   If you have any questions or concerns, please don't hesitate to send me a message via MyChart or call the office at [phone]. Thank you for visiting with [place] today! It's our pleasure caring for you.  Glad everything looks good.  Hang in there at night.

## 2019-10-07 NOTE — Progress Notes (Signed)
Subjective  Chief Complaint  Patient presents with  . Annual Exam    fasting. requesting new rx for valtrex  . Diabetes    checks DM once a week at home    HPI: Shannon Hodge is a 74 y.o. male who presents to [facility] at [facility] today for a Male Wellness Visit. He also has the concerns and/or needs as listed above in the chief complaint. These will be addressed in addition to the Health Maintenance Visit.   Wellness Visit: annual visit with health maintenance review and exam    HM: has AWV today. Fasting. Overall continues to do well. Less exercise due to it affects his mm cramps.   Body mass index is 23.76 kg/m. Wt Readings from Last 3 Encounters:  10/07/19 175 lb 3.2 oz (79.5 kg)  04/05/19 173 lb 3.2 oz (78.6 kg)  01/18/19 173 lb (78.5 kg)     Chronic disease management visit and/or acute problem visit:  RLS: sees neuro. On gabapentin. meloxicam has helped as well.   Lumbar DJD and radiculopathy per dr. Mardelle Matte  Diabetes follow up: His diabetic control is reported as Unchanged. Does well with meds and diet. Eats healthy.  He denies exertional CP or SOB or symptomatic hypoglycemia. He denies foot sores. 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 Has outdated valtrex.   HLD on statin that has been well controlled. No AEs on pravachol  Check b12 and testosterone.  Immunization History  Administered Date(s) Administered  . Influenza, High Dose Seasonal PF 05/09/2016, 05/04/2017, 04/12/2018  . Influenza,inj,Quad PF,6+ Mos 04/05/2019  . Influenza-Unspecified 05/01/2015  . Pneumococcal Conjugate-13 10/03/2016  . Pneumococcal Polysaccharide-23 01/27/2011, 09/21/2017  . Tdap 01/27/2011  . Zoster 01/19/2010  . Zoster Recombinat (Shingrix) 06/25/2018, 09/06/2018    Diabetes Related Lab Review: Lab Results  Component Value Date   HGBA1C 6.1 (A) 10/07/2019   HGBA1C 6.4 (A) 04/05/2019   HGBA1C 6.7 (A)  01/03/2019    Lab Results  Component Value Date   MICROALBUR <0.7 03/27/2018   Lab Results  Component Value Date   CREATININE 1.15 01/03/2019   BUN 25 (H) 01/03/2019   NA 142 01/03/2019   K 4.4 01/03/2019   CL 102 01/03/2019   CO2 32 01/03/2019   Lab Results  Component Value Date   CHOL 138 01/03/2019   CHOL 164 09/14/2018   CHOL 169 03/27/2018   Lab Results  Component Value Date   HDL 54.20 01/03/2019   HDL 51.60 09/14/2018   HDL 62.20 03/27/2018   Lab Results  Component Value Date   LDLCALC 67 01/03/2019   [place] 91 09/14/2018   LDLCALC 92 03/27/2018   Lab Results  Component Value Date   TRIG 83.0 01/03/2019   TRIG 106.0 09/14/2018   TRIG 76.0 03/27/2018   Lab Results  Component Value Date   CHOLHDL 3 01/03/2019   CHOLHDL 3 09/14/2018   CHOLHDL 3 03/27/2018   No results found for: LDLDIRECT The 10-year ASCVD risk score Mikey Bussing DC Jr., et al., 2013) is: 36%   Values used to calculate the score:     Age: 80 years     Sex: Male     Is Non-Hispanic African American: No     Diabetic: Yes     Tobacco smoker: No     Systolic Blood Pressure: 0000000 mmHg     Is BP treated: Yes     HDL Cholesterol: 54.2 mg/dL  Total Cholesterol: 138 mg/dL  BP Readings from Last 3 Encounters:  10/07/19 138/74  04/05/19 136/66  01/18/19 130/62   Wt Readings from Last 3 Encounters:  10/07/19 175 lb 3.2 oz (79.5 kg)  04/05/19 173 lb 3.2 oz (78.6 kg)  01/18/19 173 lb (78.5 kg)    Health Maintenance  Topic Date Due  . OPHTHALMOLOGY EXAM  03/01/2020  . HEMOGLOBIN A1C  04/08/2020  . FOOT EXAM  10/06/2020  . TETANUS/TDAP  01/26/2021  . COLONOSCOPY  11/02/2026  . INFLUENZA VACCINE  Completed  . Hepatitis C Screening  Completed  . PNA vac Low Risk Adult  Completed     Patient Active Problem List   Diagnosis Date Noted  . Diabetic polyneuropathy associated with type 2 diabetes mellitus ([place]) 01/01/2019  . Restless leg syndrome 09/21/2017  . Mixed hyperlipidemia  09/21/2017  . Controlled type 2 diabetes mellitus with diabetic polyneuropathy, without long-term current use of insulin ([place]) 09/01/2016  . Insomnia 02/22/2016  . Lumbar radiculopathy 11/04/2014  . Hearing loss 09/21/2017  . Muscle cramp, nocturnal 09/21/2017  . Foot drop, left 09/21/2017  . 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 Vitamin B12 deficiency 09/21/2017  . Allergic rhinitis 02/22/2016  . Genital herpes 02/22/2016  . 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   Health Maintenance  Topic Date Due  . OPHTHALMOLOGY EXAM  03/01/2020  . HEMOGLOBIN A1C  04/08/2020  . FOOT EXAM  10/06/2020  . TETANUS/TDAP  01/26/2021  . COLONOSCOPY  11/02/2026  . INFLUENZA VACCINE  Completed  . Hepatitis C Screening  Completed  . PNA vac Low Risk Adult  Completed   Immunization History  Administered Date(s) Administered  . Influenza, High Dose Seasonal PF 05/09/2016, 05/04/2017, 04/12/2018  . Influenza,inj,Quad PF,6+ Mos 04/05/2019  . Influenza-Unspecified 05/01/2015  . Pneumococcal Conjugate-13 10/03/2016  . Pneumococcal Polysaccharide-23 01/27/2011, 09/21/2017  . Tdap 01/27/2011  . Zoster 01/19/2010  . Zoster Recombinat (Shingrix) 06/25/2018, 09/06/2018   We updated and reviewed the patient's past history in detail and it is documented below. Allergies: Patient has No Known Allergies. Past Medical History  has a past medical history of Diabetes ([place]), Diabetic neuropathy ([place]), DJD (degenerative joint disease), Hypercholesteremia, Hyperlipidemia, Hypertension, Neuropathy, and Ruptured Achilles tendon due to trauma (10/2016). Past Surgical History Patient  has a past surgical history that includes colonscopy; Colonoscopy with propofol (N/A, 11/01/2016); Esophagogastroduodenoscopy (egd) with propofol (N/A, 11/01/2016); and Tonsillectomy. Social History Patient  reports that he has never smoked. He has never used smokeless tobacco. He reports that he does not drink alcohol or use drugs. Family History family history  includes Brain cancer in his father; Diabetes in his father, maternal grandfather, maternal grandmother, paternal grandfather, and paternal grandmother; Healthy in his daughter and son; Pancreatic cancer in his mother; Stroke in his father. Review of Systems: Constitutional: negative for fever or malaise Ophthalmic: negative for photophobia, double vision or loss of vision Cardiovascular: negative for chest pain, dyspnea on exertion, or new LE swelling Respiratory: negative for SOB or persistent cough Gastrointestinal: negative for abdominal pain, change in bowel habits or melena Genitourinary: negative for dysuria or gross hematuria Musculoskeletal: negative for muscular weakness, has some balance issues Integumentary: negative for new or persistent rashes Neurological: negative for TIA or stroke symptoms Psychiatric: negative for SI or delusions Allergic/Immunologic: negative for hives  Patient Care Team    Relationship Specialty Notifications Start End  [name], MD PCP - General Family Medicine  09/21/17   Penni Bombard, MD Consulting Physician Neurology  09/21/17   Wynetta Emery,  Ursula Alert, MD Consulting Physician Gastroenterology  09/21/17   Irine Seal, MD Attending Physician Urology  02/23/18   Marchia Bond, MD Consulting Physician Orthopedic Surgery  09/05/18    Objective  Vitals: BP 138/74 (BP Location: Right Arm, Patient Position: Sitting, Cuff Size: Normal)   Pulse (!) 57   Temp 97.6 F (36.4 C) (Temporal)   Ht 6' (1.829 m)   Wt 175 lb 3.2 oz (79.5 kg)   SpO2 98%   BMI 23.76 kg/m  General:  Well developed, well nourished, no acute distress  Psych:  Alert and orientedx3,normal mood and affect HEENT:  Normocephalic, atraumatic, non-icteric sclera, PERRL,  supple neck without adenopathy, mass or thyromegaly Cardiovascular:  Normal S1, S2, RRR without gallop, rub or murmur, +2 distal pulses in bilateral upper and lower extremities. Respiratory:  Good breath sounds  bilaterally, CTAB with normal respiratory effort Gastrointestinal: normal bowel sounds, soft, non-tender, no noted masses. No HSM MSK: no deformities, contusions. Joints are without erythema or swelling. Spine and CVA region are nontender Skin:  Warm, no rashes or suspicious lesions noted Neurologic:    Mental status is normal.  GU: No inguinal hernias or adenopathy are appreciated bilaterally   Assessment  1. Controlled type 2 diabetes mellitus with diabetic polyneuropathy, without long-term current use of insulin ([place])   2. Mixed hyperlipidemia   3. Restless leg syndrome   4. Lumbar radiculopathy   5. Primary insomnia   6. Muscle cramp, nocturnal   7. Low testosterone   8. Vitamin B12 deficiency   9. Diabetic polyneuropathy associated with type 2 diabetes mellitus [facility])      Plan  Male Wellness Visit:  Age appropriate Health Maintenance and Prevention measures were discussed with patient. Included topics are cancer screening recommendations, ways to keep healthy (see AVS) including dietary and exercise recommendations, regular eye and dental care, use of seat belts, and avoidance of moderate alcohol use and tobacco use.   BMI: discussed patient's BMI and encouraged positive lifestyle modifications to help get to or maintain a target BMI.  HM needs and immunizations were addressed and ordered. See below for orders. See HM and immunization section for updates.  Routine labs and screening tests ordered including cmp, cbc and lipids where appropriate.  Discussed recommendations regarding Vit D and calcium supplementation (see AVS)  Chronic disease f/u and/or acute problem visit: (deemed necessary to be done in addition to the wellness visit):  DM is well controlled. No change in meds.  HLD due for recheck  Appreciate ortho and neuro for back radiculopathy and RLS management. These are his most pressing problems. Counseling done. Check labs  Follow up: No follow-ups on file.    Commons side effects, risks, benefits, and alternatives for medications and treatment plan prescribed today were discussed, and the patient expressed understanding of the given instructions. Patient is instructed to call or message via MyChart if he/she has any questions or concerns regarding our treatment plan. No barriers to understanding were identified. We discussed Red Flag symptoms and signs in detail. Patient expressed understanding regarding what to do in case of urgent or emergency type symptoms.   Medication list was reconciled, printed and provided to the patient in AVS. Patient instructions and summary information was reviewed with the patient as documented in the AVS. This note was prepared with assistance of Dragon voice recognition software. Occasional wrong-word or sound-a-like substitutions may have occurred due to the inherent limitations of voice recognition software  This visit occurred during the SARS-CoV-2 public  health emergency.  Safety protocols were in place, including screening questions prior to the visit, additional usage of staff PPE, and extensive cleaning of exam room while observing appropriate contact time as indicated for disinfecting solutions.   Orders Placed This Encounter  Procedures  . CBC with Differential/Platelet  . Comprehensive metabolic panel  . Lipid panel  . TSH  . Testosterone Total,Free,Bio, Males  . Vitamin B12  . Microalbumin / creatinine urine ratio  . POCT glycosylated hemoglobin (Hb A1C)   No orders of the defined types were placed in this encounter.

## 2019-10-08 LAB — TESTOSTERONE TOTAL,FREE,BIO, MALES
Albumin: 4.8 g/dL (ref 3.6–5.1)
Sex Hormone Binding: 38 nmol/L (ref 22–77)
Testosterone, Bioavailable: 93.9 ng/dL (ref 15.0–150.0)
Testosterone, Free: 42.9 pg/mL (ref 6.0–73.0)
Testosterone: 378 ng/dL (ref 250–827)

## 2019-11-11 DIAGNOSIS — M5416 Radiculopathy, lumbar region: Secondary | ICD-10-CM | POA: Diagnosis not present

## 2019-11-11 DIAGNOSIS — M79605 Pain in left leg: Secondary | ICD-10-CM | POA: Diagnosis not present

## 2019-11-11 DIAGNOSIS — R531 Weakness: Secondary | ICD-10-CM | POA: Diagnosis not present

## 2019-11-11 DIAGNOSIS — R2689 Other abnormalities of gait and mobility: Secondary | ICD-10-CM | POA: Diagnosis not present

## 2019-11-11 DIAGNOSIS — M545 Low back pain: Secondary | ICD-10-CM | POA: Diagnosis not present

## 2019-11-14 DIAGNOSIS — M79605 Pain in left leg: Secondary | ICD-10-CM | POA: Diagnosis not present

## 2019-11-14 DIAGNOSIS — M545 Low back pain: Secondary | ICD-10-CM | POA: Diagnosis not present

## 2019-11-14 DIAGNOSIS — R2689 Other abnormalities of gait and mobility: Secondary | ICD-10-CM | POA: Diagnosis not present

## 2019-11-14 DIAGNOSIS — R531 Weakness: Secondary | ICD-10-CM | POA: Diagnosis not present

## 2019-11-14 DIAGNOSIS — M5416 Radiculopathy, lumbar region: Secondary | ICD-10-CM | POA: Diagnosis not present

## 2019-11-18 DIAGNOSIS — M5416 Radiculopathy, lumbar region: Secondary | ICD-10-CM | POA: Diagnosis not present

## 2019-11-18 DIAGNOSIS — R2689 Other abnormalities of gait and mobility: Secondary | ICD-10-CM | POA: Diagnosis not present

## 2019-11-18 DIAGNOSIS — M545 Low back pain: Secondary | ICD-10-CM | POA: Diagnosis not present

## 2019-11-18 DIAGNOSIS — R531 Weakness: Secondary | ICD-10-CM | POA: Diagnosis not present

## 2019-11-18 DIAGNOSIS — M79605 Pain in left leg: Secondary | ICD-10-CM | POA: Diagnosis not present

## 2019-11-21 DIAGNOSIS — R531 Weakness: Secondary | ICD-10-CM | POA: Diagnosis not present

## 2019-11-21 DIAGNOSIS — M5416 Radiculopathy, lumbar region: Secondary | ICD-10-CM | POA: Diagnosis not present

## 2019-11-21 DIAGNOSIS — M79605 Pain in left leg: Secondary | ICD-10-CM | POA: Diagnosis not present

## 2019-11-21 DIAGNOSIS — R2689 Other abnormalities of gait and mobility: Secondary | ICD-10-CM | POA: Diagnosis not present

## 2019-11-21 DIAGNOSIS — M545 Low back pain: Secondary | ICD-10-CM | POA: Diagnosis not present

## 2019-11-25 DIAGNOSIS — M545 Low back pain: Secondary | ICD-10-CM | POA: Diagnosis not present

## 2019-11-25 DIAGNOSIS — M79605 Pain in left leg: Secondary | ICD-10-CM | POA: Diagnosis not present

## 2019-11-25 DIAGNOSIS — M5416 Radiculopathy, lumbar region: Secondary | ICD-10-CM | POA: Diagnosis not present

## 2019-11-25 DIAGNOSIS — R2689 Other abnormalities of gait and mobility: Secondary | ICD-10-CM | POA: Diagnosis not present

## 2019-11-25 DIAGNOSIS — R531 Weakness: Secondary | ICD-10-CM | POA: Diagnosis not present

## 2019-11-28 DIAGNOSIS — M5416 Radiculopathy, lumbar region: Secondary | ICD-10-CM | POA: Diagnosis not present

## 2019-11-28 DIAGNOSIS — R2689 Other abnormalities of gait and mobility: Secondary | ICD-10-CM | POA: Diagnosis not present

## 2019-11-28 DIAGNOSIS — R531 Weakness: Secondary | ICD-10-CM | POA: Diagnosis not present

## 2019-11-28 DIAGNOSIS — M79605 Pain in left leg: Secondary | ICD-10-CM | POA: Diagnosis not present

## 2019-11-28 DIAGNOSIS — M545 Low back pain: Secondary | ICD-10-CM | POA: Diagnosis not present

## 2019-12-02 DIAGNOSIS — R531 Weakness: Secondary | ICD-10-CM | POA: Diagnosis not present

## 2019-12-02 DIAGNOSIS — M545 Low back pain: Secondary | ICD-10-CM | POA: Diagnosis not present

## 2019-12-02 DIAGNOSIS — M5416 Radiculopathy, lumbar region: Secondary | ICD-10-CM | POA: Diagnosis not present

## 2019-12-02 DIAGNOSIS — R2689 Other abnormalities of gait and mobility: Secondary | ICD-10-CM | POA: Diagnosis not present

## 2019-12-02 DIAGNOSIS — M79605 Pain in left leg: Secondary | ICD-10-CM | POA: Diagnosis not present

## 2019-12-05 DIAGNOSIS — R531 Weakness: Secondary | ICD-10-CM | POA: Diagnosis not present

## 2019-12-05 DIAGNOSIS — M5416 Radiculopathy, lumbar region: Secondary | ICD-10-CM | POA: Diagnosis not present

## 2019-12-05 DIAGNOSIS — R2689 Other abnormalities of gait and mobility: Secondary | ICD-10-CM | POA: Diagnosis not present

## 2019-12-05 DIAGNOSIS — M545 Low back pain: Secondary | ICD-10-CM | POA: Diagnosis not present

## 2019-12-05 DIAGNOSIS — M79605 Pain in left leg: Secondary | ICD-10-CM | POA: Diagnosis not present

## 2019-12-10 DIAGNOSIS — R2689 Other abnormalities of gait and mobility: Secondary | ICD-10-CM | POA: Diagnosis not present

## 2019-12-10 DIAGNOSIS — R531 Weakness: Secondary | ICD-10-CM | POA: Diagnosis not present

## 2019-12-10 DIAGNOSIS — M79605 Pain in left leg: Secondary | ICD-10-CM | POA: Diagnosis not present

## 2019-12-10 DIAGNOSIS — M5416 Radiculopathy, lumbar region: Secondary | ICD-10-CM | POA: Diagnosis not present

## 2019-12-10 DIAGNOSIS — M545 Low back pain: Secondary | ICD-10-CM | POA: Diagnosis not present

## 2019-12-12 DIAGNOSIS — M545 Low back pain: Secondary | ICD-10-CM | POA: Diagnosis not present

## 2019-12-12 DIAGNOSIS — M79605 Pain in left leg: Secondary | ICD-10-CM | POA: Diagnosis not present

## 2019-12-12 DIAGNOSIS — R2689 Other abnormalities of gait and mobility: Secondary | ICD-10-CM | POA: Diagnosis not present

## 2019-12-12 DIAGNOSIS — R531 Weakness: Secondary | ICD-10-CM | POA: Diagnosis not present

## 2019-12-12 DIAGNOSIS — M5416 Radiculopathy, lumbar region: Secondary | ICD-10-CM | POA: Diagnosis not present

## 2020-01-06 DIAGNOSIS — M545 Low back pain: Secondary | ICD-10-CM | POA: Diagnosis not present

## 2020-01-06 DIAGNOSIS — R531 Weakness: Secondary | ICD-10-CM | POA: Diagnosis not present

## 2020-01-06 DIAGNOSIS — M5416 Radiculopathy, lumbar region: Secondary | ICD-10-CM | POA: Diagnosis not present

## 2020-01-06 DIAGNOSIS — M79605 Pain in left leg: Secondary | ICD-10-CM | POA: Diagnosis not present

## 2020-01-06 DIAGNOSIS — R2689 Other abnormalities of gait and mobility: Secondary | ICD-10-CM | POA: Diagnosis not present

## 2020-01-08 DIAGNOSIS — R2689 Other abnormalities of gait and mobility: Secondary | ICD-10-CM | POA: Diagnosis not present

## 2020-01-08 DIAGNOSIS — M545 Low back pain: Secondary | ICD-10-CM | POA: Diagnosis not present

## 2020-01-08 DIAGNOSIS — M79605 Pain in left leg: Secondary | ICD-10-CM | POA: Diagnosis not present

## 2020-01-08 DIAGNOSIS — M5416 Radiculopathy, lumbar region: Secondary | ICD-10-CM | POA: Diagnosis not present

## 2020-01-08 DIAGNOSIS — R531 Weakness: Secondary | ICD-10-CM | POA: Diagnosis not present

## 2020-03-20 ENCOUNTER — Telehealth: Payer: Self-pay | Admitting: Diagnostic Neuroimaging

## 2020-03-20 NOTE — Telephone Encounter (Signed)
Pt called wanting to speak to the RN or Provider regarding the numbness that he is still experiencing. Pt states that it is worse at night and it is affecting his sleep. Please advise.

## 2020-03-20 NOTE — Telephone Encounter (Signed)
Called patient and advised we can schedule a FU with MD or NP. He scheduled with MD to be seen sooner,  verbalized understanding, appreciation.

## 2020-03-30 DIAGNOSIS — Z23 Encounter for immunization: Secondary | ICD-10-CM | POA: Diagnosis not present

## 2020-04-01 ENCOUNTER — Other Ambulatory Visit: Payer: Self-pay

## 2020-04-01 ENCOUNTER — Encounter: Payer: Self-pay | Admitting: Family Medicine

## 2020-04-01 ENCOUNTER — Ambulatory Visit (INDEPENDENT_AMBULATORY_CARE_PROVIDER_SITE_OTHER): Payer: Medicare Other | Admitting: Family Medicine

## 2020-04-01 VITALS — BP 120/68 | HR 67 | Temp 97.2°F | Resp 16 | Ht 72.0 in | Wt 175.6 lb

## 2020-04-01 DIAGNOSIS — S86011S Strain of right Achilles tendon, sequela: Secondary | ICD-10-CM | POA: Diagnosis not present

## 2020-04-01 DIAGNOSIS — M5416 Radiculopathy, lumbar region: Secondary | ICD-10-CM

## 2020-04-01 DIAGNOSIS — G2581 Restless legs syndrome: Secondary | ICD-10-CM | POA: Diagnosis not present

## 2020-04-01 DIAGNOSIS — E1142 Type 2 diabetes mellitus with diabetic polyneuropathy: Secondary | ICD-10-CM

## 2020-04-01 DIAGNOSIS — M48061 Spinal stenosis, lumbar region without neurogenic claudication: Secondary | ICD-10-CM | POA: Insufficient documentation

## 2020-04-01 DIAGNOSIS — I10 Essential (primary) hypertension: Secondary | ICD-10-CM

## 2020-04-01 DIAGNOSIS — M48062 Spinal stenosis, lumbar region with neurogenic claudication: Secondary | ICD-10-CM | POA: Diagnosis not present

## 2020-04-01 DIAGNOSIS — S86011A Strain of right Achilles tendon, initial encounter: Secondary | ICD-10-CM | POA: Insufficient documentation

## 2020-04-01 LAB — POCT GLYCOSYLATED HEMOGLOBIN (HGB A1C): Hemoglobin A1C: 6.1 % — AB (ref 4.0–5.6)

## 2020-04-01 NOTE — Progress Notes (Signed)
Subjective  CC:  Chief Complaint  Patient presents with  . Diabetes    worsening polyneuropathy - trouble staying sleeping - bad leg cramps     HPI: Shannon Hodge is a 74 y.o. male who presents to the office today for follow up of diabetes and problems listed above in the chief complaint.   Diabetes follow up: His diabetic control is reported as Unchanged.  Continues to eat healthy diet and take his Metformin. He denies exertional CP or SOB or symptomatic hypoglycemia. He denies foot sores but continues to suffer from paresthesias.  Has follow-up with neurology.  Taking Cymbalta and gabapentin.  No pain.  He believes eye exams up-to-date but needs to check.  No retinopathy.  Hypertension hyperlipidemia: Both well controlled.  Continues on ACE inhibitor and statin.  No adverse effects.  Also on fenofibrate.  Achilles tendon rupture: Continues to exercise to try to regain muscle mass.  He was brought to an electronic bike   Spinal stenosis: Continues to be limited with neurogenic claudication and some pain.  He did complete physical therapy.  Wt Readings from Last 3 Encounters:  04/01/20 175 lb 9.6 oz (79.7 kg)  10/07/19 175 lb 4.3 oz (79.5 kg)  10/07/19 175 lb 3.2 oz (79.5 kg)    BP Readings from Last 3 Encounters:  04/01/20 120/68  10/07/19 138/74  10/07/19 138/74    Assessment  1. Controlled type 2 diabetes mellitus with diabetic polyneuropathy, without long-term current use of insulin ([place])   2. Lumbar radiculopathy   3. Restless leg syndrome   4. Essential hypertension   5. Rupture of right Achilles tendon, sequela   6. Spinal stenosis of lumbar region with neurogenic claudication      Plan   Diabetes is currently very well controlled.  Continue Metformin.  Update eye exam as needed.  Musicians are up-to-date.  Hypertension hyperlipidemia well-controlled.  No changes today.  Peripheral neuropathy, restless leg syndrome: Continue current medications.  Discussed pain  management but fortunately is not having significant pain.  Continue to monitor.  Diabetic foot care discussed.  Neck pain syndrome stable  Follow up: 6 months for complete physical and follow-up of chronic medical problems. Orders Placed This Encounter  Procedures  . POCT HgB A1C   No orders of the defined types were placed in this encounter.     Immunization History  Administered Date(s) Administered  . Fluad Quad(high Dose 65+) 03/30/2020  . Influenza, High Dose Seasonal PF 05/09/2016, 05/04/2017, 04/12/2018, 03/30/2020  . Influenza,inj,Quad PF,6+ Mos 04/05/2019  . Influenza-Unspecified 05/01/2015  . PFIZER SARS-COV-2 Vaccination 08/22/2019, 09/12/2019, 03/30/2020  . Pneumococcal Conjugate-13 10/03/2016  . Pneumococcal Polysaccharide-23 01/27/2011, 09/21/2017  . Tdap 01/27/2011  . Zoster 01/19/2010  . Zoster Recombinat (Shingrix) 06/25/2018, 09/06/2018    Diabetes Related Lab Review: Lab Results  Component Value Date   HGBA1C 6.1 (A) 04/01/2020   HGBA1C 6.1 (A) 10/07/2019   HGBA1C 6.4 (A) 04/05/2019    Lab Results  Component Value Date   MICROALBUR <0.7 10/07/2019   Lab Results  Component Value Date   CREATININE 1.17 10/07/2019   BUN 25 (H) 10/07/2019   NA 141 10/07/2019   K 4.3 10/07/2019   CL 103 10/07/2019   CO2 32 10/07/2019   Lab Results  Component Value Date   CHOL 146 10/07/2019   CHOL 138 01/03/2019   CHOL 164 09/14/2018   Lab Results  Component Value Date   HDL 58.20 10/07/2019   HDL 54.20 01/03/2019  HDL 51.60 09/14/2018   Lab Results  Component Value Date   LDLCALC 71 10/07/2019   LDLCALC 67 01/03/2019   LDLCALC 91 09/14/2018   Lab Results  Component Value Date   TRIG 86.0 10/07/2019   TRIG 83.0 01/03/2019   TRIG 106.0 09/14/2018   Lab Results  Component Value Date   CHOLHDL 3 10/07/2019   CHOLHDL 3 01/03/2019   CHOLHDL 3 09/14/2018   No results found for: LDLDIRECT The 10-year ASCVD risk score Mikey Bussing DC Jr., et al., 2013)  is: 29.2%   Values used to calculate the score:     Age: 32 years     Sex: Male     Is Non-Hispanic African American: No     Diabetic: Yes     Tobacco smoker: No     Systolic Blood Pressure: 789 mmHg     Is BP treated: Yes     HDL Cholesterol: 58.2 mg/dL     Total Cholesterol: 146 mg/dL I have reviewed the PMH, Fam and Soc history. Patient Active Problem List   Diagnosis Date Noted  . Spinal stenosis of lumbar region 04/01/2020    Priority: High    Dr. Mardelle Matte   . Diabetic polyneuropathy associated with type 2 diabetes mellitus ([place]) 01/01/2019    Priority: High  . Restless leg syndrome 09/21/2017    Priority: High  . Mixed hyperlipidemia 09/21/2017    Priority: High  . Controlled type 2 diabetes mellitus with diabetic polyneuropathy, without long-term current use of insulin ([place]) 09/01/2016    Priority: High  . Insomnia 02/22/2016    Priority: High  . Lumbar radiculopathy 11/04/2014    Priority: High  . Hearing loss 09/21/2017    Priority: Medium    Wears hearing aides   . Muscle cramp, nocturnal 09/21/2017    Priority: Medium  . Foot drop, left 09/21/2017    Priority: Medium  . 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 Vitamin B12 deficiency 09/21/2017    Priority: Low  . Allergic rhinitis 02/22/2016    Priority: Low  . Genital herpes 02/22/2016    Priority: Low  . Essential hypertension 04/01/2020  . Achilles rupture, right 04/01/2020  . 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 He has never used smokeless tobacco. He reports that he does not drink alcohol and does not use drugs.  Review of Systems: Ophthalmic: negative for eye pain, loss of vision or double vision Cardiovascular: negative for chest pain Respiratory: negative for SOB or persistent cough Gastrointestinal: negative for abdominal pain Genitourinary: negative for dysuria or gross hematuria MSK: negative for foot lesions Neurologic: negative  for weakness or gait disturbance  Objective  Vitals: BP 120/68   Pulse 67   Temp (!) 97.2 F (36.2 C) (Temporal)   Resp 16   Ht 6' (1.829 m)   Wt 175 lb 9.6 oz (79.7 kg)   SpO2 97%   BMI 23.82 kg/m  General: well appearing, no acute distress  Psych:  Alert and oriented, normal mood and affect HEENT:  Normocephalic, atraumatic, moist mucous membranes, supple neck  Cardiovascular:  Nl S1 and S2, RRR without murmur, gallop or rub. no edema Respiratory:  Good breath sounds bilaterally, CTAB with normal effort, no rales     Diabetic education: ongoing education regarding chronic disease management for diabetes was given today. We continue to reinforce the ABC's of diabetic management: A1c (<7 or 8 dependent upon patient), tight blood  pressure control, and cholesterol management with goal LDL < 100 minimally. We discuss diet strategies, exercise recommendations, medication options and possible side effects. At each visit, we review recommended immunizations and preventive care recommendations for diabetics and stress that good diabetic control can prevent other problems. See below for this patient's data.    Commons side effects, risks, benefits, and alternatives for medications and treatment plan prescribed today were discussed, and the patient expressed understanding of the given instructions. Patient is instructed to call or message via MyChart if he/she has any questions or concerns regarding our treatment plan. No barriers to understanding were identified. We discussed Red Flag symptoms and signs in detail. Patient expressed understanding regarding what to do in case of urgent or emergency type symptoms.   Medication list was reconciled, printed and provided to the patient in AVS. Patient instructions and summary information was reviewed with the patient as documented in the AVS. This note was prepared with assistance of Dragon voice recognition software. Occasional wrong-word or  sound-a-like substitutions may have occurred due to the inherent limitations of voice recognition software  This visit occurred during the SARS-CoV-2 public health emergency.  Safety protocols were in place, including screening questions prior to the visit, additional usage of staff PPE, and extensive cleaning of exam room while observing appropriate contact time as indicated for disinfecting solutions.

## 2020-04-01 NOTE — Patient Instructions (Signed)
Please return in 6 months 12 months for your annual complete physical; please come fasting.  Everything looks good! Call Dr. Oswaldo Conroy office to see when your last eye exam was and let us know. Thanks!  If you have any questions or concerns, please don't hesitate to send me a message via MyChart or call the office at [phone]. Thank you for visiting with [place] today! It's our pleasure caring for you.

## 2020-04-07 ENCOUNTER — Encounter: Payer: Self-pay | Admitting: Family Medicine

## 2020-04-07 ENCOUNTER — Ambulatory Visit (INDEPENDENT_AMBULATORY_CARE_PROVIDER_SITE_OTHER): Payer: Medicare Other | Admitting: Diagnostic Neuroimaging

## 2020-04-07 ENCOUNTER — Encounter: Payer: Self-pay | Admitting: Diagnostic Neuroimaging

## 2020-04-07 ENCOUNTER — Other Ambulatory Visit: Payer: Self-pay

## 2020-04-07 VITALS — BP 137/68 | HR 61 | Ht 70.0 in | Wt 176.0 lb

## 2020-04-07 DIAGNOSIS — R252 Cramp and spasm: Secondary | ICD-10-CM

## 2020-04-07 DIAGNOSIS — M5416 Radiculopathy, lumbar region: Secondary | ICD-10-CM | POA: Diagnosis not present

## 2020-04-07 DIAGNOSIS — R200 Anesthesia of skin: Secondary | ICD-10-CM | POA: Diagnosis not present

## 2020-04-07 DIAGNOSIS — E1142 Type 2 diabetes mellitus with diabetic polyneuropathy: Secondary | ICD-10-CM | POA: Diagnosis not present

## 2020-04-07 MED ORDER — AMITRIPTYLINE HCL 25 MG PO TABS: 25.0000 mg | ORAL_TABLET | Freq: Every day | ORAL | 6 refills | Status: DC

## 2020-04-07 NOTE — Progress Notes (Signed)
[facility]  PATIENT: Shannon Hodge DOB: July 28, 1946  REFERRING CLINICIAN:  HISTORY FROM: patient REASON FOR VISIT: follow up   HISTORICAL  CHIEF COMPLAINT:  Chief Complaint  Patient presents with  . Neuropathy    rm 7 "FU requested for neuropathy; I wake at night with neuropathy issues, cramps"    HISTORY OF PRESENT ILLNESS:   UPDATE (04/07/20, VRP): Since last visit, having more insomnia and nocturnal muscle cramps. Neuropathy symptoms are not painful.  UPDATE (02/20/18, VRP): Since last visit, doing about the same. Symptoms are stable. Severity is mild. No alleviating or aggravating factors. Tolerating gabapentin.  UPDATE 01/27/17: Since last visit, had right achilles tear on 11/15/16. Now healing and recovering. Overall pain in feet and legs are stable.  UPDATE 07/28/16: Since last visit, sxs are slightly worse. Night time cramping, pain and interrupted sleep.  UPDATE 11/23/15: Since last visit, neuropathy sxs are stable. Exercising more and doing well. Nighttime cramps.   UPDATE 05/20/15: Since last visit, tried PT and using gabapentin. Still with cramps, numbness and left hip pain.  PRIOR HPI (11/04/14): 74 year old right-handed male here for evaluation of low back pain, leg cramps, neuropathy. Patient reports some symptoms starting around age 48-37 years old, but more significant symptoms in the last 6-12 months. In his 86s, patient developed low back pain, rating left side, left leg, with mild left foot drop. Patient was diagnosed with some degenerative lumbar spine disease, possible left L5 radiculopathy, treated conservatively. Patient had lingering symptoms throughout his life. Occasionally he would have cramping, pain in his left leg. In 2000, patient diagnosed diabetes. Over past 1-2 years he has noticed some numbness and tingling in his toes and feet, mainly the bottom. He feels a thick, numb, dead sensation in his feet. No significant electrical, stinging,  pins and needles pain in his feet. Patient started on gabapentin by PCP which has mildly helped his numbness symptoms. Now patient having more problems with cramping in his left leg, hamstring region, calf, intermittently, mainly when he lays down or sits down for a long time. Moving, standing, stretching seems to help. He is fairly active at the gym several times per week, using upper body strength machines, weightlifting, treadmill walking.   REVIEW OF SYSTEMS: Full 14 system review of systems performed and negative: except for freq waking.    ALLERGIES: No Known Allergies  HOME MEDICATIONS: Outpatient Medications Prior to Visit  Medication Sig Dispense Refill  . acetaminophen (TYLENOL) 500 MG tablet Take 500 mg by mouth daily as needed for moderate pain or headache.    . APPLE CIDER VINEGAR PO Take by mouth.    Marland Kitchen aspirin 325 MG tablet Take 162.5 mg by mouth daily.     . cholecalciferol (VITAMIN D) 1000 units tablet Take 1,000 Units by mouth daily.    . Coenzyme Q10 (COQ10) 100 MG CAPS     . Cyanocobalamin (VITAMIN B-12 PO) Take by mouth daily.    . fenofibrate micronized (LOFIBRA) 134 MG capsule Take 1 capsule (134 mg total) by mouth daily before breakfast. 90 capsule 3  . gabapentin (NEURONTIN) 600 MG tablet Take 1.5 tablets (900 mg total) by mouth 2 (two) times daily. 270 tablet 4  . lisinopril (ZESTRIL) 20 MG tablet Take 1 tablet (20 mg total) by mouth daily. 90 tablet 3  . Magnesium Oxide (MAG-200) 200 MG TABS Take 400 mg by mouth daily.     . meloxicam (MOBIC) 15 MG tablet Take 15 mg by mouth daily.    Marland Kitchen  metFORMIN (GLUCOPHAGE-XR) 500 MG 24 hr tablet Take 4 tablets (2,000 mg total) by mouth daily. 360 tablet 3  . pyridOXINE (VITAMIN B-6) 100 MG tablet Take 100 mg by mouth every evening.    . simvastatin (ZOCOR) 40 MG tablet TAKE 1 TABLET BY MOUTH EVERYDAY AT BEDTIME 90 tablet 3  . valACYclovir (VALTREX) 500 MG tablet Take 500 mg by mouth daily as needed (outbreak).   2   No  facility-administered medications prior to visit.    PAST MEDICAL HISTORY: Past Medical History:  Diagnosis Date  . Diabetes ([place])    type 2  . Diabetic neuropathy ([facility])    both legs  . DJD (degenerative joint disease)    L 5  . Hypercholesteremia   . Hyperlipidemia   . Hypertension   . Neuropathy   . Ruptured Achilles tendon due to trauma 10/2016    PAST SURGICAL HISTORY: Past Surgical History:  Procedure Laterality Date  . COLONOSCOPY WITH PROPOFOL N/A 11/01/2016   Procedure: COLONOSCOPY WITH PROPOFOL;  Surgeon: Garlan Fair, MD;  Location: [facility];  Service: Endoscopy;  Laterality: N/A;  . colonscopy     x 2  . ESOPHAGOGASTRODUODENOSCOPY (EGD) WITH PROPOFOL N/A 11/01/2016   Procedure: ESOPHAGOGASTRODUODENOSCOPY (EGD) WITH PROPOFOL;  Surgeon: Garlan Fair, MD;  Location: [facility];  Service: Endoscopy;  Laterality: N/A;  . TONSILLECTOMY      FAMILY HISTORY: Family History  Problem Relation Age of Onset  . Pancreatic cancer Mother   . Stroke Father   . Brain cancer Father   . Diabetes Father   . Healthy Daughter   . Healthy Son   . Diabetes Maternal Grandmother   . Diabetes Maternal Grandfather   . Diabetes Paternal Grandmother   . Diabetes Paternal Grandfather     SOCIAL HISTORY:  Social History   Socioeconomic History  . Marital status: Married    Spouse name: Diane   . Number of children: 3  . Years of education: post grad  . Highest education level: Not on file  Occupational History  . Occupation: retired English as a second language teacher  Tobacco Use  . Smoking status: Never Smoker  . Smokeless tobacco: Never Used  Vaping Use  . Vaping Use: Never used  Substance and Sexual Activity  . Alcohol use: No    Alcohol/week: 0.0 standard drinks  . Drug use: No  . Sexual activity: Yes  Other Topics Concern  . Not on file  Social History Narrative   Lives with wife    Drinks 2 cups of coffee a day   Social Determinants of Health   Financial Resource Strain:     . Difficulty of Paying Living Expenses: Not on file  Food Insecurity:   . Worried About Charity fundraiser in the Last Year: Not on file  . Ran Out of Food in the Last Year: Not on file  Transportation Needs:   . Lack of Transportation (Medical): Not on file  . Lack of Transportation (Non-Medical): Not on file  Physical Activity:   . Days of Exercise per Week: Not on file  . Minutes of Exercise per Session: Not on file  Stress:   . Feeling of Stress : Not on file  Social Connections:   . Frequency of Communication with Friends and Family: Not on file  . Frequency of Social Gatherings with Friends and Family: Not on file  . Attends Religious Services: Not on file  . Active Member of Clubs or Organizations: Not on file  .  Attends Archivist Meetings: Not on file  . Marital Status: Not on file  Intimate Partner Violence:   . Fear of Current or Ex-Partner: Not on file  . Emotionally Abused: Not on file  . Physically Abused: Not on file  . Sexually Abused: Not on file     PHYSICAL EXAM  GENERAL EXAM/CONSTITUTIONAL: Vitals:  Vitals:   04/07/20 0924  BP: 137/68  Pulse: 61  Weight: 176 lb (79.8 kg)  Height: 5\' 10"  (1.778 m)   Body mass index is 25.25 kg/m. Wt Readings from Last 3 Encounters:  04/07/20 176 lb (79.8 kg)  04/01/20 175 lb 9.6 oz (79.7 kg)  10/07/19 175 lb 4.3 oz (79.5 kg)    Patient is in no distress; well developed, nourished and groomed; neck is supple  CARDIOVASCULAR:  Examination of carotid arteries is normal; no carotid bruits  Regular rate and rhythm, no murmurs  Examination of peripheral vascular system by observation and palpation is normal  EYES:  Ophthalmoscopic exam of optic discs and posterior segments is normal; no papilledema or hemorrhages No exam data present  MUSCULOSKELETAL:  Gait, strength, tone, movements noted in Neurologic exam below  NEUROLOGIC: MENTAL STATUS:  MMSE - [place] Exam 09/05/2018   Orientation to time 5  Orientation to Place 5  Registration 3  Attention/ Calculation 5  Recall 3  Language- name 2 objects 2  Language- repeat 1  Language- follow 3 step command 3  Language- read & follow direction 1  Write a sentence 1  Copy design 1  Total score 30    awake, alert, oriented to person, place and time  recent and remote memory intact  normal attention and concentration  language fluent, comprehension intact, naming intact,   fund of knowledge appropriate  CRANIAL NERVE:   2nd - no papilledema on fundoscopic exam  2nd, 3rd, 4th, 6th - pupils equal and reactive to light, visual fields full to confrontation, extraocular muscles intact, no nystagmus  5th - facial sensation symmetric  7th - facial strength symmetric  8th - hearing intact  9th - palate elevates symmetrically, uvula midline  11th - shoulder shrug symmetric  12th - tongue protrusion midline  MOTOR:   normal bulk and tone, full strength in the BUE, BLE  MINIMAL POSTURAL TREMOR IN RIGHT UPPER EXT  SENSORY:   normal and symmetric to light touch, temperature; DECR VIBRATION  COORDINATION:   finger-nose-finger, fine finger movements normal  REFLEXES:   deep tendon reflexes TRACE and symmetric  GAIT/STATION:   narrow based gait      DIAGNOSTIC DATA (LABS, IMAGING, TESTING) - I reviewed patient records, labs, notes, testing and imaging myself where available.  Lab Results  Component Value Date   WBC 4.6 10/07/2019   HGB 13.5 10/07/2019   HCT 38.5 (L) 10/07/2019   MCV 92.5 10/07/2019   PLT 187.0 10/07/2019      Component Value Date/Time   NA 141 10/07/2019 0853   NA 142 08/30/2017 0000   K 4.3 10/07/2019 0853   CL 103 10/07/2019 0853   CO2 32 10/07/2019 0853   GLUCOSE 106 (H) 10/07/2019 0853   BUN 25 (H) 10/07/2019 0853   BUN 19 08/30/2017 0000   CREATININE 1.17 10/07/2019 0853   CALCIUM 9.6 10/07/2019 0853   PROT 6.8 10/07/2019 0853   ALBUMIN 4.5  10/07/2019 0853   AST 20 10/07/2019 0853   ALT 24 10/07/2019 0853   ALKPHOS 42 10/07/2019 0853   BILITOT 0.9 10/07/2019  5400   Lab Results  Component Value Date   CHOL 146 10/07/2019   HDL 58.20 10/07/2019   LDLCALC 71 10/07/2019   TRIG 86.0 10/07/2019   CHOLHDL 3 10/07/2019   Lab Results  Component Value Date   HGBA1C 6.1 (A) 04/01/2020   Lab Results  Component Value Date   [id number] 267 10/07/2019   Lab Results  Component Value Date   TSH 1.16 10/07/2019     01/04/18 CT abd / pelvis [I reviewed images myself and agree with interpretation. There are degenerative changes throughout the lumbar spine.  Moderate by foraminal stenosis at L3-4 and L4-5.  Vacuum disc phenomenon noted. -VRP]   06/19/19 MRI lumbar spine [I reviewed images myself and agree with interpretation. -VRP]  1. Disc degeneration from L2-3 to L5-S1 with straightening of the back. 2. L4-5 biforaminal stenosis with L4 mass-effect on the right. Spinal stenosis is moderate. 3. L5-S1 biforaminal impingement particularly advanced on the left. 4. Transitional S1 vertebra.  09/26/19 EMG/NCS - Axonal sensorimotor polyneuropathy.    ASSESSMENT AND PLAN  74 y.o. year old male here with 30+ years of low back pain, radiating to left leg, with intermittent left leg cramps / pain.   Meds tried: gabapentin, cymbalta  Dx: left lumbar radiculopathy (L5) + diabetic neuropathy + muscle cramps (due to neuropathy and radiculopathy and restless leg syndrome)  1. Diabetic polyneuropathy associated with type 2 diabetes mellitus ([place])   2. Lumbar radiculopathy   3. Numbness   4. Leg cramps      PLAN:  MUSCLE CRAMPS / INSOMNIA (worsened; ddx: neuropathy, lumbar radiculopathy, statin) - continue PT exercises - consider to wean off statin (will discuss with PCP) - trial of amitriptyline 25mg   NUMBNESS IN FEET / NEUROPATHY (sligthly worsened, but no pain) - wean off gabapentin 900mg  twice a day in future - continue  physical activities  LUMBAR RADICULOPATHY (STABLE) - stable; continue conservative mgmt  TREMOR (possible essential tremor) - monitor for now; may consider primidone or propranolol  Meds ordered this encounter  Medications  . amitriptyline (ELAVIL) 25 MG tablet    Sig: Take 1 tablet (25 mg total) by mouth at bedtime.    Dispense:  30 tablet    Refill:  6   Orders Placed This Encounter  Procedures  . Ambulatory referral to Neurology   Return in about 1 year (around 04/07/2021) for with NP (Amy Lomax).     Penni Bombard, MD 08/02/4578, 99:83 AM Certified in Neurology, Neurophysiology and Neuroimaging  [facility] [address] [place], [place] [address] [phone]

## 2020-04-07 NOTE — Patient Instructions (Signed)
MUSCLE CRAMPS / INSOMNIA (worsened; ddx: neuropathy, lumbar radiculopathy, statin) - continue PT exercises - consider to reduce statin (will discuss with PCP) - trial of amitriptyline 25mg  at bedtime  NUMBNESS IN FEET / NEUROPATHY (sligthly worsened, but no pain) - may consider to wean off gabapentin 900mg  twice a day in future - continue physical activities

## 2020-04-08 ENCOUNTER — Ambulatory Visit: Payer: Medicare Other | Admitting: Family Medicine

## 2020-04-13 ENCOUNTER — Telehealth: Payer: Self-pay | Admitting: Family Medicine

## 2020-04-13 NOTE — Telephone Encounter (Signed)
Reason for Call Symptomatic / Request for Health Information Initial Comment Caller states, pt having rectal bleeding. Translation No Nurse Assessment Nurse: Shannon Emery, RN, Shannon Hodge Date/Time Shannon Hodge Time): 04/13/2020 8:22:54 AM Confirm and document reason for call. If symptomatic, describe symptoms. ---Shannon Hodge started two days ago with rectal bleeding passed a hard stool and then noted bright red bleeding; has been riding bike; Has the patient had close contact with a person known or suspected to have the novel coronavirus illness OR traveled / lives in area with major community spread (including international travel) in the last 14 days from the onset of symptoms? * If Asymptomatic, screen for exposure and travel within the last 14 days. ---No Does the patient have any new or worsening symptoms? ---Yes Will a triage be completed? ---Yes Related visit to physician within the last 2 weeks? ---No Does the PT have any chronic conditions? (i.e. diabetes, asthma, this includes High risk factors for pregnancy, etc.) ---Yes List chronic conditions. ---last colonscopy and Endo was 3 yrs ago Diabetic Neuropathy Is this a behavioral health or substance abuse call? ---No Guidelines Guideline Title Affirmed Question Affirmed Notes Nurse Date/Time Shannon Hodge Time) Rectal Bleeding MILD rectal bleeding (more than just a few drops or streaks) Shannon Hodge 04/13/2020 8:26:01 AM Disp. Time Shannon Hodge Time) Disposition Final User 04/13/2020 8:32:16 AM SEE PCP WITHIN 3 DAYS Yes Shannon Emery, RN, Shannon Hodge NOTE: All timestamps contained within this report are represented as [place] Standard Time. CONFIDENTIALTY NOTICE: This fax transmission is intended only for the addressee. It contains information that is legally privileged, confidential or otherwise protected from use or disclosure. If you are not the intended recipient, you are strictly prohibited from reviewing, disclosing, copying using or disseminating any of  this information or taking any action in reliance on or regarding this information. If you have received this fax in error, please notify us immediately by telephone so that we can arrange for its return to [place]. Phone: [phone], Toll-Free: [phone], Fax: [phone] Page: 2 of 2 Call Id: [id number] Shannon Hodge Disagree/Comply Comply Caller Understands Yes PreDisposition Call Doctor Care Advice Given Per Guideline SEE PCP WITHIN 3 DAYS: WARM SALINE SITZ BATHS - TWICE DAILY FOR RECTAL SYMPTOMS: * Sit in a warm sitz bath for 20 minutes twice a day. This will decrease swelling and irritation, keep the area clean, and help with healing. * Eat a high fiber diet. * Drink adequate liquids (6-8 glasses of water a day) * Exercise regularly (even a daily 15 minute walk!) * Get into a rhythm - try to have a BM at the same time each day. * Don't ignore your body's signals regarding having a BM * Avoid enemas and stimulant laxatives. * A high fiber diet will help improve your intestinal function and soften your BM's. The fiber works by holding more water in your stools. * Try to eat fresh fruit and vegetables at each meal (peas, prunes, citrus, apples, beans, corn). * Eat more grain foods (bran flakes, bran muffins, graham crackers, oatmeal, brown rice, and whole wheat bread). Popcorn is a source of fiber. * After SITZ bath and drying your rectal area, put hydrocortisone ointment on the area 2 times a day. This will help decrease the itching. * This is an over-the-counter (OTC) drug. You can buy it at the drugstore. It can be found in a number of OTC hemorrhoid medicines (such as Anusol HC, Preparation H Hydrocortisone, Analpram HC Cream) * Dizziness occurs * Bleeding increases * You become worse Referrals REFERRED TO PCP OFFICe

## 2020-04-13 NOTE — Telephone Encounter (Signed)
FYI, patient scheduled for tomorrow at 9 AM

## 2020-04-14 ENCOUNTER — Other Ambulatory Visit: Payer: Self-pay

## 2020-04-14 ENCOUNTER — Encounter: Payer: Self-pay | Admitting: Family Medicine

## 2020-04-14 ENCOUNTER — Ambulatory Visit (INDEPENDENT_AMBULATORY_CARE_PROVIDER_SITE_OTHER): Payer: Medicare Other | Admitting: Family Medicine

## 2020-04-14 VITALS — BP 140/82 | HR 75 | Temp 97.9°F | Wt 176.4 lb

## 2020-04-14 DIAGNOSIS — K625 Hemorrhage of anus and rectum: Secondary | ICD-10-CM

## 2020-04-14 LAB — CBC WITH DIFFERENTIAL/PLATELET
Absolute Monocytes: 302 cells/uL (ref 200–950)
Basophils Absolute: 18 cells/uL (ref 0–200)
Basophils Relative: 0.4 %
Eosinophils Absolute: 212 cells/uL (ref 15–500)
Eosinophils Relative: 4.7 %
HCT: 38.2 % — ABNORMAL LOW (ref 38.5–50.0)
Hemoglobin: 12.8 g/dL — ABNORMAL LOW (ref 13.2–17.1)
Lymphs Abs: 999 cells/uL (ref 850–3900)
MCH: 31.8 pg (ref 27.0–33.0)
MCHC: 33.5 g/dL (ref 32.0–36.0)
MCV: 95 fL (ref 80.0–100.0)
MPV: 10.6 fL (ref 7.5–12.5)
Monocytes Relative: 6.7 %
Neutro Abs: 2970 cells/uL (ref 1500–7800)
Neutrophils Relative %: 66 %
Platelets: 177 10*3/uL (ref 140–400)
RBC: 4.02 10*6/uL — ABNORMAL LOW (ref 4.20–5.80)
RDW: 11.9 % (ref 11.0–15.0)
Total Lymphocyte: 22.2 %
WBC: 4.5 10*3/uL (ref 3.8–10.8)

## 2020-04-14 MED ORDER — HYDROCORTISONE ACETATE 25 MG RE SUPP: 25.0000 mg | Freq: Every evening | RECTAL | 1 refills | Status: AC

## 2020-04-14 NOTE — Patient Instructions (Signed)
Please follow up if symptoms do not improve or as needed.    Rectal Bleeding  Rectal bleeding is when blood passes out of the anus. People with rectal bleeding may notice bright red blood in their underwear or in the toilet after having a bowel movement. They may also have dark red or black stools. Rectal bleeding is usually a sign that something is wrong. Many things can cause rectal bleeding, including:  Hemorrhoids. These are blood vessels in the anus or rectum that are larger than normal.  Fistulas. These are abnormal passages in the rectum and anus.  Anal fissures. This is a tear in the anus.  Diverticulosis. This is a condition in which pockets or sacs project from the bowel.  Proctitis and colitis. These are conditions in which the rectum, colon, or anus become inflamed.  Polyps. These are growths that can be cancerous (malignant) or non-cancerous (benign).  Part of the rectum sticking out from the anus (rectal prolapse).  Certain medicines.  Intestinal infections. Follow these instructions at home: Pay attention to any changes in your symptoms. Take these actions to help lessen bleeding and discomfort:  Eat a diet that is high in fiber. This will keep your stool soft, making it easier to pass stools without straining. Ask your health care provider what foods and drinks are high in fiber.  Drink enough fluid to keep your urine clear or pale yellow. This also helps to keep your stool soft.  Try taking a warm bath. This may help soothe any pain in your rectum.  Keep all follow-up visits as told by your health care provider. This is important. Get help right away if:  You have new or increased rectal bleeding.  You have black or dark red stools.  You vomit blood or something that looks like coffee grounds.  You have pain or tenderness in your abdomen.  You have a fever.  You feel weak.  You feel nauseous.  You faint.  You have severe pain in your  rectum.  You cannot have a bowel movement. This information is not intended to replace advice given to you by your health care provider. Make sure you discuss any questions you have with your health care provider. Document Revised: 03/10/2016 Document Reviewed: 09/13/2015 Elsevier Patient Education  2020 [employer].

## 2020-04-14 NOTE — Progress Notes (Signed)
Subjective  CC:  Chief Complaint  Patient presents with  . Rectal Bleeding    started 5 days ago, bright red blood, been riding his bike frequently     HPI: Shannon Hodge is a 74 y.o. male who presents to the office today to address the problems listed above in the chief complaint.  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  Bright red blood noted.  Nonpainful defecation.  Normal bowel movements.  [place].  No melena.  No blood in the toilet.  No constipation or straining.  No history of hemorrhoids.  Reviewed upper endoscopy and colonoscopy from 2018.  Colonoscopy was completely normal at that time.  He feels well.  Of note, he has been bicycling 1 hour/day for the last several weeks.  He has had occasional soreness in the rectal area from the hard seat.  He did order a Gelfoam seat.   Assessment  1. Rectal bleeding      Plan   Painless rectal bleeding, scant: Reassuring history.  Treat with rectal steroid suppositories for 1 week.  Follow-up if persists or worsen.  See after visit summary for instructions.  Check CBC  Follow up: as scheduled  09/30/2020  Orders Placed This Encounter  Procedures  . CBC with Differential/Platelet   Meds ordered this encounter  Medications  . hydrocortisone (ANUSOL-HC) 25 MG suppository    Sig: Place 1 suppository (25 mg total) rectally at bedtime for 7 days.    Dispense:  7 suppository    Refill:  1      I reviewed the patients updated PMH, FH, and SocHx.    Patient Active Problem List   Diagnosis Date Noted  . Spinal stenosis of lumbar region 04/01/2020    Priority: High  . Diabetic polyneuropathy associated with type 2 diabetes mellitus ([place]) 01/01/2019    Priority: High  . Restless leg syndrome 09/21/2017    Priority: High  . Mixed hyperlipidemia 09/21/2017    Priority: High  . Controlled type 2  diabetes mellitus with diabetic polyneuropathy, without long-term current use of insulin ([place]) 09/01/2016    Priority: High  . Insomnia 02/22/2016    Priority: High  . Lumbar radiculopathy 11/04/2014    Priority: High  . Hearing loss 09/21/2017    Priority: Medium  . Muscle cramp, nocturnal 09/21/2017    Priority: Medium  . Foot drop, left 09/21/2017    Priority: Medium  . 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 Vitamin B12 deficiency 09/21/2017    Priority: Low  . Allergic rhinitis 02/22/2016    Priority: Low  . Genital herpes 02/22/2016    Priority: Low  . Essential hypertension 04/01/2020  . Achilles rupture, right 04/01/2020  . 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 acetaminophen (TYLENOL) 500 MG tablet Take 500 mg by mouth daily as needed for moderate pain or headache.  Marland Kitchen amitriptyline (ELAVIL) 25 MG tablet Take 1 tablet (25 mg total) by mouth at bedtime.  . APPLE CIDER VINEGAR PO Take by mouth.  Marland Kitchen aspirin 325 MG tablet Take 162.5 mg by mouth daily.   . cholecalciferol (VITAMIN D) 1000 units tablet Take 1,000 Units by mouth daily.  . Coenzyme Q10 (COQ10) 100 MG CAPS   . Cyanocobalamin (VITAMIN B-12 PO) Take by mouth daily.  . fenofibrate micronized (LOFIBRA) 134 MG capsule  Take 1 capsule (134 mg total) by mouth daily before breakfast.  . gabapentin (NEURONTIN) 600 MG tablet Take 1.5 tablets (900 mg total) by mouth 2 (two) times daily.  Marland Kitchen lisinopril (ZESTRIL) 20 MG tablet Take 1 tablet (20 mg total) by mouth daily.  . Magnesium Oxide (MAG-200) 200 MG TABS Take 400 mg by mouth daily.   . meloxicam (MOBIC) 15 MG tablet Take 15 mg by mouth daily.  . metFORMIN (GLUCOPHAGE-XR) 500 MG 24 hr tablet Take 4 tablets (2,000 mg total) by mouth daily.  Marland Kitchen pyridOXINE (VITAMIN B-6) 100 MG tablet Take 100 mg by mouth every evening.  . simvastatin (ZOCOR) 40 MG tablet TAKE 1 TABLET BY MOUTH EVERYDAY AT BEDTIME  . valACYclovir (VALTREX) 500 MG tablet Take 500 mg  by mouth daily as needed (outbreak).     Allergies: Patient has No Known Allergies. Family History: Patient family history includes Brain cancer in his father; Diabetes in his father, maternal grandfather, maternal grandmother, paternal grandfather, and paternal grandmother; Healthy in his daughter and son; Pancreatic cancer in his mother; Stroke in his father. Social History:  Patient  reports that he has never smoked. He has never used smokeless tobacco. He reports that he does not drink alcohol and does not use drugs.  Review of Systems: Constitutional: Negative for fever malaise or anorexia Cardiovascular: negative for chest pain Respiratory: negative for SOB or persistent cough Gastrointestinal: negative for abdominal pain  Objective  Vitals: BP 140/82   Pulse 75   Temp 97.9 F (36.6 C) (Temporal)   Wt 176 lb 6.4 oz (80 kg)   SpO2 98%   BMI 25.31 kg/m  General: no acute distress , A&Ox3 Well-appearing    Commons side effects, risks, benefits, and alternatives for medications and treatment plan prescribed today were discussed, and the patient expressed understanding of the given instructions. Patient is instructed to call or message via MyChart if he/she has any questions or concerns regarding our treatment plan. No barriers to understanding were identified. We discussed Red Flag symptoms and signs in detail. Patient expressed understanding regarding what to do in case of urgent or emergency type symptoms.   Medication list was reconciled, printed and provided to the patient in AVS. Patient instructions and summary information was reviewed with the patient as documented in the AVS. This note was prepared with assistance of Dragon voice recognition software. Occasional wrong-word or sound-a-like substitutions may have occurred due to the inherent limitations of voice recognition software  This visit occurred during the SARS-CoV-2 public health emergency.  Safety protocols were  in place, including screening questions prior to the visit, additional usage of staff PPE, and extensive cleaning of exam room while observing appropriate contact time as indicated for disinfecting solutions.

## 2020-04-22 ENCOUNTER — Encounter: Payer: Self-pay | Admitting: Family Medicine

## 2020-04-22 ENCOUNTER — Other Ambulatory Visit: Payer: Self-pay

## 2020-04-22 ENCOUNTER — Telehealth: Payer: Self-pay | Admitting: Family Medicine

## 2020-04-22 MED ORDER — HYDROCORTISONE ACETATE 25 MG RE SUPP: 25.0000 mg | Freq: Every evening | RECTAL | 0 refills | Status: DC

## 2020-04-22 NOTE — Telephone Encounter (Signed)
7 day prescription sent to [facility]. Patient aware.

## 2020-04-22 NOTE — Telephone Encounter (Signed)
Can continue for another week.

## 2020-04-22 NOTE — Telephone Encounter (Signed)
Patient states He  completed the seven day course of medicated suppository and still have slight blood release.  I say the treatment is 80% effective.  Where does he go from here ? Would like a call back regarding

## 2020-04-24 ENCOUNTER — Telehealth: Payer: Self-pay | Admitting: Family Medicine

## 2020-04-24 DIAGNOSIS — E1140 Type 2 diabetes mellitus with diabetic neuropathy, unspecified: Secondary | ICD-10-CM | POA: Diagnosis not present

## 2020-04-24 DIAGNOSIS — R252 Cramp and spasm: Secondary | ICD-10-CM | POA: Diagnosis not present

## 2020-04-24 DIAGNOSIS — M5116 Intervertebral disc disorders with radiculopathy, lumbar region: Secondary | ICD-10-CM | POA: Diagnosis not present

## 2020-04-24 DIAGNOSIS — Z7984 Long term (current) use of oral hypoglycemic drugs: Secondary | ICD-10-CM | POA: Diagnosis not present

## 2020-04-24 NOTE — Telephone Encounter (Signed)
New onset diarrhea: need to monitor for other symptoms.  Covid testing if other sxs develop.  Visit if needs help managing; rec immodium and good hydration.  Visit if abdominal pain or significant bleeding.   Not too concerned about small amount of blood passes in setting of recent rectal bleeding. But needs further eval if worsening.

## 2020-04-24 NOTE — Telephone Encounter (Signed)
Patient states that he has has several episodes of diarrhea since last night. Did notice a small blood clot during the visit episode and small amount of bright red blood since he passed the blood clot.

## 2020-04-24 NOTE — Telephone Encounter (Signed)
Pt called stating he is having bowel issues and wanted to speak with the CMA. Please advise.

## 2020-04-25 NOTE — Telephone Encounter (Signed)
MyChart message sent to patient.

## 2020-04-30 ENCOUNTER — Other Ambulatory Visit: Payer: Self-pay

## 2020-04-30 ENCOUNTER — Telehealth: Payer: Self-pay | Admitting: Diagnostic Neuroimaging

## 2020-04-30 DIAGNOSIS — K625 Hemorrhage of anus and rectum: Secondary | ICD-10-CM

## 2020-04-30 NOTE — Telephone Encounter (Signed)
Pt called, week after starting amitriptyline (ELAVIL) 25 MG tablet had a flair up in colon with some sly bleeding. Reported  to Dr. Jonni Sanger, he prescribed hydrocortisone (ANUSOL-HC) 25 MG suppository. Pt stated, "going to stop taking amitriptyline (ELAVIL) 25 MG tablet, suspect this is a side effect. I am going try this own my own to see how I feel. Would like a call from the nurse.

## 2020-04-30 NOTE — Telephone Encounter (Signed)
Called patient who stated the bleeding started a week after starting amitriptyline which was working for cramps and insomnia.  He called PCP who prescribed suppositories. He used suppository x 2 weeks but still has slight bleeding and stomach upset. He has appointment with GI in 4 weeks, will stay off amitriptyline until he sees GI. He mentioned he had second opinion at [facility], was recommended he consider taking mexiletine, but he will wait until GUI issue resolved. He asked that Dr Leta Baptist be made aware; I advised will pass along. Patient verbalized understanding, appreciation.

## 2020-04-30 NOTE — Telephone Encounter (Signed)
Noted. Follow up with PCP and GI re: GI bleeding. -VRP

## 2020-05-01 ENCOUNTER — Encounter: Payer: Self-pay | Admitting: Gastroenterology

## 2020-05-01 NOTE — Telephone Encounter (Signed)
Read message to pt. Pt understood.

## 2020-05-05 ENCOUNTER — Other Ambulatory Visit: Payer: Self-pay

## 2020-05-05 ENCOUNTER — Ambulatory Visit (INDEPENDENT_AMBULATORY_CARE_PROVIDER_SITE_OTHER): Payer: Medicare Other | Admitting: Family Medicine

## 2020-05-05 ENCOUNTER — Encounter: Payer: Self-pay | Admitting: Family Medicine

## 2020-05-05 VITALS — BP 122/74 | HR 78 | Temp 97.9°F | Ht 70.0 in | Wt 168.8 lb

## 2020-05-05 DIAGNOSIS — M5416 Radiculopathy, lumbar region: Secondary | ICD-10-CM | POA: Diagnosis not present

## 2020-05-05 NOTE — Progress Notes (Signed)
   Shannon Hodge is a 74 y.o. male who presents today for an office visit.  Assessment/Plan:  New/Acute Problems: Rectal Bleeding / Abdominal Pain / Diarrhea Likely had gastritis secondary to meloxicam and aspirin intake. Was recently started on amitriptyline which probably exacerbated the issue. Symptoms have mostly resolved since he stopped taking the meloxicam about a week ago but still has some ongoing diarrhea and flatulence. This possibly could be secondary to amitriptyline use. He is concerned about possible side effects to the Nexium as well. He would like to stop taking Nexium given that his rectal bleeding and abdominal pain have subsided. I think this is reasonable. He will take Tums or Pepcid if needed. Recommended fiber and probiotics to help with loose stools. Anticipate this will improve over the next several days. Discussed reasons to return to care. Will need to restart PPI if abdominal pain returns. Given that symptoms have essentially resolved do not think we need to pursue further work-up at this point.    Subjective:  HPI:  Patient here with a few weeks of rectal bleeding.  Saw his PCP about 3 weeks ago and was treated for hemorrhoids with rectal suppository.  Symptoms improved but still had residual blood with bowel movement.  Since had ongoing bleeding and progressive upper abdominal pain. He noted that he has been take meloxicam for the past several weeks. His neurologist also recently started him on amitriptyline. He thought that it was possible that the meloxicam was causing his symptoms. He stopped taking meloxicam about a week ago. He has had significant improvement in rectal bleeding since then. Has not noticed any blood with bowel movement though has noticed a darker colored stool over the last few days. He talked with his pharmacist who recommended that he start Nexium. He has been doing this for about 3 days. The upper abdominal pain has subsided since he has been taking  this. However he has noted that he has had worsening gas, bloating, and diarrhea for the past few days. He has additionally restarted amitriptyline a few days ago. He was also previously taking aspirin 162.5 mg daily but is no longer taking that since his rectal bleeding started.  He tried getting in contact with his GI physician but was told it would take 8 weeks for him to be seen.  Symptoms seem to be improving overall.       Objective:  Physical Exam: Ht 5\' 10"  (1.778 m)   BMI 25.31 kg/m   Gen: No acute distress, resting comfortably Neuro: Grossly normal, moves all extremities Psych: Normal affect and thought content  Time Spent: 44 minutes of total time was spent on the date of the encounter performing the following actions: chart review prior to seeing the patient including recent visit with PCP, obtaining history, performing a medically necessary exam, counseling on the treatment plan, placing orders, and documenting in our EHR.        Algis Greenhouse. Jerline Pain, MD 05/05/2020 2:16 PM

## 2020-05-08 ENCOUNTER — Encounter: Payer: Self-pay | Admitting: Family Medicine

## 2020-05-22 NOTE — Telephone Encounter (Signed)
Please advise 

## 2020-05-22 NOTE — Telephone Encounter (Signed)
Please advised  

## 2020-05-25 MED ORDER — MESALAMINE 1000 MG RE SUPP: 1000.0000 mg | Freq: Every day | RECTAL | 0 refills | Status: DC

## 2020-05-25 MED ORDER — MESALAMINE 1.2 G PO TBEC: 1.2000 g | DELAYED_RELEASE_TABLET | Freq: Every day | ORAL | 0 refills | Status: DC

## 2020-06-05 ENCOUNTER — Other Ambulatory Visit (INDEPENDENT_AMBULATORY_CARE_PROVIDER_SITE_OTHER): Payer: Medicare Other

## 2020-06-05 ENCOUNTER — Ambulatory Visit (INDEPENDENT_AMBULATORY_CARE_PROVIDER_SITE_OTHER): Payer: Medicare Other | Admitting: Gastroenterology

## 2020-06-05 ENCOUNTER — Encounter: Payer: Self-pay | Admitting: Gastroenterology

## 2020-06-05 VITALS — BP 134/58 | HR 74 | Ht 72.0 in | Wt 168.0 lb

## 2020-06-05 DIAGNOSIS — R194 Change in bowel habit: Secondary | ICD-10-CM

## 2020-06-05 DIAGNOSIS — K625 Hemorrhage of anus and rectum: Secondary | ICD-10-CM

## 2020-06-05 LAB — C-REACTIVE PROTEIN: CRP: <1 mg/dL (ref 0.5–20.0)

## 2020-06-05 LAB — SEDIMENTATION RATE: Sed Rate: 4 mm/hr (ref 0–20)

## 2020-06-05 MED ORDER — NA SULFATE-K SULFATE-MG SULF 17.5-3.13-1.6 GM/177ML PO SOLN: 1.0000 | Freq: Once | ORAL | 0 refills | Status: AC

## 2020-06-05 NOTE — Patient Instructions (Addendum)
I have recommended some labs and stool studies to look for infection and inflammation. I have also recommended a colonoscopy for further evaluation of your bleeding and change in bowel habits.   In the meantime, I recommend a high fiber diet. Eat at least 30 grams of fiber in the form of fruit, vegetables, and bran fiber daily.  Please drink at least 1.5-2 liters of water every day.  Add a daily stool bulking agent with psyllium or methylcellulose every day.  Please call with any questions or concerns prior to your colonoscopy.  You have been scheduled for a colonoscopy. Please follow written instructions given to you at your visit today.  Please pick up your prep supplies at the pharmacy within the next 1-3 days. If you use inhalers (even only as needed), please bring them with you on the day of your procedure.  Normal BMI (Body Mass Index- based on height and weight) is between 23 and 30. Your BMI today is Body mass index is 22.78 kg/m. Marland Kitchen Please consider follow up  regarding your BMI with your Primary Care Provider.

## 2020-06-05 NOTE — Progress Notes (Signed)
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 Will screen for infectious etiologies. Plan colonoscopy to evaluate for IBD. Add a daily stool bulking agent to improve his symptoms and consider dicyclomine in the meantime.   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 Perhaps cause was NSAIDs. EGD +/- UGI series if colonoscopy is negative.   PLAN: Add a daily stool bulking agent such as psyllium or methylcellulose ESR, CRP, fecal calprotectin GI pathogen panel Colonoscopy Obtain prior records from [facility] (that predate 2018) Consider trial of dicyclomine 20 mg QID PRN Low threshold for UGI series +/- EGD given the history of duodenal stricture  Please see the "Patient Instructions" section for addition details about the plan.  HPI: Shannon Hodge is a 74 y.o. male referred by Dr. Jonni Sanger for further evaluation of rectal bleeding. The history is obtained through the patient and review of his electronic health record. He has diabetes, chronic back pain, chronic kidney disease.   Reports a history of NSAID-related colitis 8 years ago diagnosed on colonoscopy with Dr. Wynetta Emery. Treated with mesalamine. Told to stop using ibuprofen.   Had an EGD and colonoscopy with Dr. [name] at [facility] 11/01/16 for heme positive stools and a hemoglobin of 12.8. . EGD showed distal duodenal bulb benign stricture  preventing intubation of the second portion duodenum. Colonoscopy was normal. Surveillance colonoscopy recommended in 10 years.   Presents today with a 5 week history of abdominal pain, loose stools with urgency, bright red blood on the toilet paper, mucous in the stool, and associated flatulence that improved with empiric oral mesalamine, Nexium, probiotics, and fiber. Some relief with loperamine. Symptoms have tied him to stay near a bathroom due to severity. Eating more carefully to minimize symptoms including beans, fruits, and vegetables. Has not impacted his symptoms.   Started taking meloxicam 4 months ago for back pain. Treated with Nexium by Dr. Jerline Pain but he was concerned about possible side effects. Also took amitryltilene for one week prior to developing rectal bleeding.   Labs 04/14/20: hemoglobin 12, MCV 95, RDW 11.9, platelets 177 Hemoglobin was 13.5 in 09/2019  No prior GI imaging.  No known family history of colon cancer or polyps. No family history of uterine/endometrial cancer, pancreatic cancer or gastric/stomach cancer.   Past Medical History:  Diagnosis Date  . Diabetes ([place])    type 2  . Diabetic neuropathy ([facility])    both legs  . DJD (degenerative joint disease)    L 5  . Hypercholesteremia   . Hyperlipidemia   . Hypertension   . Neuropathy   . Ruptured Achilles tendon due to trauma 10/2016    Past Surgical History:  Procedure Laterality Date  . COLONOSCOPY WITH PROPOFOL N/A 11/01/2016   Procedure: COLONOSCOPY WITH PROPOFOL;  Surgeon: Garlan Fair, MD;  Location: [facility];  Service: Endoscopy;  Laterality: N/A;  . colonscopy     x 2  . ESOPHAGOGASTRODUODENOSCOPY (EGD)  WITH PROPOFOL N/A 11/01/2016   Procedure: ESOPHAGOGASTRODUODENOSCOPY (EGD) WITH PROPOFOL;  Surgeon: Garlan Fair, MD;  Location: [facility];  Service: Endoscopy;  Laterality: N/A;  . TONSILLECTOMY      Current Outpatient Medications  Medication Sig Dispense Refill  . acetaminophen  (TYLENOL) 500 MG tablet Take 500 mg by mouth daily as needed for moderate pain or headache.    Marland Kitchen amitriptyline (ELAVIL) 25 MG tablet Take 1 tablet (25 mg total) by mouth at bedtime. 30 tablet 6  . APPLE CIDER VINEGAR PO Take by mouth.    Marland Kitchen aspirin 325 MG tablet Take 162.5 mg by mouth daily.  (Patient not taking: Reported on 05/05/2020)    . cholecalciferol (VITAMIN D) 1000 units tablet Take 1,000 Units by mouth daily.    . Coenzyme Q10 (COQ10) 100 MG CAPS     . Cyanocobalamin (VITAMIN B-12 PO) Take by mouth daily.    Marland Kitchen esomeprazole (NEXIUM) 20 MG packet Take 20 mg by mouth daily before breakfast.    . fenofibrate micronized (LOFIBRA) 134 MG capsule Take 1 capsule (134 mg total) by mouth daily before breakfast. 90 capsule 3  . gabapentin (NEURONTIN) 600 MG tablet Take 1.5 tablets (900 mg total) by mouth 2 (two) times daily. 270 tablet 4  . hydrocortisone (ANUSOL-HC) 25 MG suppository Place 1 suppository (25 mg total) rectally at bedtime. 7 suppository 0  . lisinopril (ZESTRIL) 20 MG tablet Take 1 tablet (20 mg total) by mouth daily. 90 tablet 3  . Magnesium Oxide (MAG-200) 200 MG TABS Take 400 mg by mouth daily.     . meloxicam (MOBIC) 15 MG tablet Take 15 mg by mouth daily. (Patient not taking: Reported on 05/05/2020)    . mesalamine (CANASA) 1000 MG suppository Place 1 suppository (1,000 mg total) rectally at bedtime. 30 suppository 0  . mesalamine (LIALDA) 1.2 g EC tablet Take 1 tablet (1.2 g total) by mouth daily with breakfast. 30 tablet 0  . metFORMIN (GLUCOPHAGE-XR) 500 MG 24 hr tablet Take 4 tablets (2,000 mg total) by mouth daily. 360 tablet 3  . pyridOXINE (VITAMIN B-6) 100 MG tablet Take 100 mg by mouth every evening.    . simvastatin (ZOCOR) 40 MG tablet TAKE 1 TABLET BY MOUTH EVERYDAY AT BEDTIME 90 tablet 3  . valACYclovir (VALTREX) 500 MG tablet Take 500 mg by mouth daily as needed (outbreak).   2   No current facility-administered medications for this visit.    Allergies as of  06/05/2020  . (No Known Allergies)    Family History  Problem Relation Age of Onset  . Pancreatic cancer Mother   . Stroke Father   . Brain cancer Father   . Diabetes Father   . Healthy Daughter   . Healthy Son   . Diabetes Maternal Grandmother   . Diabetes Maternal Grandfather   . Diabetes Paternal Grandmother   . Diabetes Paternal Grandfather     Social History   Socioeconomic History  . Marital status: Married    Spouse name: Diane   . Number of children: 3  . Years of education: post grad  . Highest education level: Not on file  Occupational History  . Occupation: retired English as a second language teacher  Tobacco Use  . Smoking status: Never Smoker  . Smokeless tobacco: Never Used  Vaping Use  . Vaping Use: Never used  Substance and Sexual Activity  . Alcohol use: No    Alcohol/week: 0.0 standard drinks  . Drug use: No  . Sexual activity: Yes  Other Topics Concern  . Not on file  Social History Narrative   Lives with wife    Drinks 2 cups of coffee a day   Social Determinants of Health   Financial Resource Strain:   . Difficulty of Paying Living Expenses: Not on file  Food Insecurity:   . Worried About Charity fundraiser in the Last Year: Not on file  . Ran Out of Food in the Last Year: Not on file  Transportation Needs:   . Lack of Transportation (Medical): Not on file  . Lack of Transportation (Non-Medical): Not on file  Physical Activity:   . Days of Exercise per Week: Not on file  . Minutes of Exercise per Session: Not on file  Stress:   . Feeling of Stress : Not on file  Social Connections:   . Frequency of Communication with Friends and Family: Not on file  . Frequency of Social Gatherings with Friends and Family: Not on file  . Attends Religious Services: Not on file  . Active Member of Clubs or Organizations: Not on file  . Attends Archivist Meetings: Not on file  . Marital Status: Not on file  Intimate Partner Violence:   . Fear of Current or  Ex-Partner: Not on file  . Emotionally Abused: Not on file  . Physically Abused: Not on file  . Sexually Abused: Not on file    Review of Systems: 12 system ROS is negative except as noted above with back pain, hearing problems, or insomnia.   Physical Exam: General:   Alert,  well-nourished, pleasant and cooperative in NAD Head:  Normocephalic and atraumatic. Eyes:  Sclera clear, no icterus.   Conjunctiva pink. Ears:  Normal auditory acuity. Nose:  No deformity, discharge,  or lesions. Mouth:  No deformity or lesions.   Neck:  Supple; no masses or thyromegaly. Lungs:  Clear throughout to auscultation.   No wheezes. Heart:  Regular rate and rhythm; no murmurs. Abdomen:  Soft, nontender, nondistended, normal bowel sounds, no rebound or guarding. No hepatosplenomegaly.   Rectal:  Deferred  Msk:  Symmetrical. No boney deformities LAD: No inguinal or umbilical LAD Extremities:  No clubbing or edema. Neurologic:  Alert and  oriented x4;  grossly nonfocal Skin:  Intact without significant lesions or rashes. Psych:  Alert and cooperative. Normal mood and affect.   Trent Theisen L. Tarri Glenn, MD, MPH 06/05/2020, 12:45 PM

## 2020-06-08 ENCOUNTER — Other Ambulatory Visit: Payer: Medicare Other

## 2020-06-08 ENCOUNTER — Encounter: Payer: Self-pay | Admitting: Gastroenterology

## 2020-06-08 DIAGNOSIS — K625 Hemorrhage of anus and rectum: Secondary | ICD-10-CM

## 2020-06-08 DIAGNOSIS — R194 Change in bowel habit: Secondary | ICD-10-CM | POA: Diagnosis not present

## 2020-06-09 DIAGNOSIS — H2512 Age-related nuclear cataract, left eye: Secondary | ICD-10-CM | POA: Diagnosis not present

## 2020-06-09 DIAGNOSIS — E119 Type 2 diabetes mellitus without complications: Secondary | ICD-10-CM | POA: Diagnosis not present

## 2020-06-09 DIAGNOSIS — H40023 Open angle with borderline findings, high risk, bilateral: Secondary | ICD-10-CM | POA: Diagnosis not present

## 2020-06-11 LAB — GI PROFILE, STOOL, PCR

## 2020-06-11 LAB — CALPROTECTIN, FECAL: Calprotectin, Fecal: 106 ug/g (ref 0–120)

## 2020-06-17 ENCOUNTER — Other Ambulatory Visit: Payer: Self-pay | Admitting: Family Medicine

## 2020-06-19 ENCOUNTER — Encounter: Payer: Self-pay | Admitting: Gastroenterology

## 2020-06-19 ENCOUNTER — Other Ambulatory Visit: Payer: Self-pay

## 2020-06-19 ENCOUNTER — Ambulatory Visit (AMBULATORY_SURGERY_CENTER): Payer: Medicare Other | Admitting: Gastroenterology

## 2020-06-19 VITALS — BP 108/50 | HR 66 | Temp 98.9°F | Resp 12 | Ht 72.0 in | Wt 168.0 lb

## 2020-06-19 DIAGNOSIS — K5289 Other specified noninfective gastroenteritis and colitis: Secondary | ICD-10-CM

## 2020-06-19 DIAGNOSIS — K625 Hemorrhage of anus and rectum: Secondary | ICD-10-CM | POA: Diagnosis not present

## 2020-06-19 DIAGNOSIS — R194 Change in bowel habit: Secondary | ICD-10-CM

## 2020-06-19 DIAGNOSIS — K529 Noninfective gastroenteritis and colitis, unspecified: Secondary | ICD-10-CM | POA: Diagnosis not present

## 2020-06-19 MED ORDER — MESALAMINE 4 G RE ENEM: 4.0000 g | ENEMA | Freq: Two times a day (BID) | RECTAL | 0 refills | Status: DC

## 2020-06-19 MED ORDER — SODIUM CHLORIDE 0.9 % IV SOLN: 500.0000 mL | Freq: Once | INTRAVENOUS | Status: DC

## 2020-06-19 NOTE — Progress Notes (Signed)
Vitals by CW 

## 2020-06-19 NOTE — Progress Notes (Signed)
Called to room to assist during endoscopic procedure.  Patient ID and intended procedure confirmed with present staff. Received instructions for my participation in the procedure from the performing physician.  

## 2020-06-19 NOTE — Progress Notes (Signed)
To PACU, VSS. Report to [email or web address] 

## 2020-06-19 NOTE — Op Note (Addendum)
[facility] Patient Name: Shannon Hodge Procedure Date: 06/19/2020 2:11 PM MRN: [id number] Endoscopist: Thornton Park MD, MD Age: 74 Referring MD:  Date of Birth: [birth year] Gender: Male Account #: [id number] Procedure:                Colonoscopy Indications:              Change in bowel habits, abdominal pain, urgency,                            and rectal bleeding x 5 weeks                           Normocytic anemia with progression since 09/2019                           History of active chronic proctitis 8 years ago                            that patient attributes to NSAIDs, normal                            colonoscopy 2018 Medicines:                Monitored Anesthesia Care Procedure:                Pre-Anesthesia Assessment:                           - Prior to the procedure, a History and Physical                            was performed, and patient medications and                            allergies were reviewed. The patient's tolerance of                            previous anesthesia was also reviewed. The risks                            and benefits of the procedure and the sedation                            options and risks were discussed with the patient.                            All questions were answered, and informed consent                            was obtained. Prior Anticoagulants: The patient has                            taken no previous anticoagulant or antiplatelet  agents. ASA Grade Assessment: III - A patient with                            severe systemic disease. After reviewing the risks                            and benefits, the patient was deemed in                            satisfactory condition to undergo the procedure.                           After obtaining informed consent, the colonoscope                            was passed under direct vision. Throughout the                             procedure, the patient's blood pressure, pulse, and                            oxygen saturations were monitored continuously. The                            Colonoscope was introduced through the anus and                            advanced to the 5 cm into the ileum. The                            colonoscopy was performed without difficulty. The                            patient tolerated the procedure well. The quality                            of the bowel preparation was good. The terminal                            ileum, ileocecal valve, appendiceal orifice, and                            rectum were photographed. Scope In: Scope Out: Findings:                 The perianal and digital rectal examinations were                            normal.                           A diffuse area of mild to moderately congested,                            erythematous and ulcerated mucosa  was found in the                            rectum and in the recto-sigmoid colon to 20 cm from                            the anal verge. Biopsies were taken with a cold                            forceps for histology. Estimated blood loss was                            minimal.                           A patchy area of mildly erythematous and ulcerated                            mucosa was found in the cecum. Biopsies were taken                            with a cold forceps for histology. Estimated blood                            loss was minimal.                           The remainder of the examined colon appeared                            normal. Biopsies were obtained throughout the colon                            for histology. Estimated blood loss was minimal.                           The terminal ileum appeared normal. Biopsies were                            taken with a cold forceps for histology. Estimated                            blood loss was minimal.                           The  exam was otherwise without abnormality on                            direct and retroflexion views. Complications:            No immediate complications. Estimated blood loss:                            Minimal. Estimated Blood Loss:     Estimated blood loss was minimal. Impression:               -  Congested, erythematous and ulcerated mucosa in                            the rectum and in the recto-sigmoid colon. Biopsied.                           - Mild erythema in the cecum. Biopsied.                           - The entire examined colon is normal.                           - The examined portion of the ileum was normal.                            Biopsied.                           - The examination was otherwise normal on direct                            and retroflexion views. Recommendation:           - Patient has a contact number available for                            emergencies. The signs and symptoms of potential                            delayed complications were discussed with the                            patient. Return to normal activities tomorrow.                            Written discharge instructions were provided to the                            patient.                           - Resume previous diet.                           - Continue present medications.                           - No aspirin, ibuprofen, naproxen, or other                            non-steroidal anti-inflammatory drugs.                           - Await pathology results.                           - Use Rowasa enemas 1 per  rectum BID for 2 weeks,                            reduce to 1 per rectum daily.                           - Follow-up in the office in 2-3 weeks. Thornton Park MD, MD 06/19/2020 3:01:12 PM This report has been signed electronically.

## 2020-06-19 NOTE — Patient Instructions (Signed)
YOU HAD AN ENDOSCOPIC PROCEDURE TODAY AT THE [facility]:   Refer to the procedure report that was given to you for any specific questions about what was found during the examination.  If the procedure report does not answer your questions, please call your gastroenterologist to clarify.  If you requested that your care partner not be given the details of your procedure findings, then the procedure report has been included in a sealed envelope for you to review at your convenience later.  YOU SHOULD EXPECT: Some feelings of bloating in the abdomen. Passage of more gas than usual.  Walking can help get rid of the air that was put into your GI tract during the procedure and reduce the bloating. If you had a lower endoscopy (such as a colonoscopy or flexible sigmoidoscopy) you may notice spotting of blood in your stool or on the toilet paper. If you underwent a bowel prep for your procedure, you may not have a normal bowel movement for a few days.  Please Note:  You might notice some irritation and congestion in your nose or some drainage.  This is from the oxygen used during your procedure.  There is no need for concern and it should clear up in a day or so.  SYMPTOMS TO REPORT IMMEDIATELY:   Following lower endoscopy (colonoscopy or flexible sigmoidoscopy):  Excessive amounts of blood in the stool  Significant tenderness or worsening of abdominal pains  Swelling of the abdomen that is new, acute  Fever of 100F or higher  For urgent or emergent issues, a gastroenterologist can be reached at any hour by calling [phone]. Do not use MyChart messaging for urgent concerns.    DIET:  We do recommend a small meal at first, but then you may proceed to your regular diet.  Drink plenty of fluids but you should avoid alcoholic beverages for 24 hours.  ACTIVITY:  You should plan to take it easy for the rest of today and you should NOT DRIVE or use heavy machinery until tomorrow (because  of the sedation medicines used during the test).    FOLLOW UP: Our staff will call the number listed on your records 48-72 hours following your procedure to check on you and address any questions or concerns that you may have regarding the information given to you following your procedure. If we do not reach you, we will leave a message.  We will attempt to reach you two times.  During this call, we will ask if you have developed any symptoms of COVID 19. If you develop any symptoms (ie: fever, flu-like symptoms, shortness of breath, cough etc.) before then, please call [phone].  If you test positive for Covid 19 in the 2 weeks post procedure, please call and report this information to us.    If any biopsies were taken you will be contacted by phone or by letter within the next 1-3 weeks.  Please call us at [phone] if you have not heard about the biopsies in 3 weeks.    SIGNATURES/CONFIDENTIALITY: You and/or your care partner have signed paperwork which will be entered into your electronic medical record.  These signatures attest to the fact that that the information above on your After Visit Summary has been reviewed and is understood.  Full responsibility of the confidentiality of this discharge information lies with you and/or your care-partner. 

## 2020-06-22 ENCOUNTER — Telehealth: Payer: Self-pay | Admitting: Gastroenterology

## 2020-06-22 NOTE — Telephone Encounter (Signed)
Returned pts call.  He had questions that he was going to send via my chart to Dr. Tarri Glenn.

## 2020-06-22 NOTE — Telephone Encounter (Signed)
Patient called states he is well just a bit of fluid and slight blood bur if feeling fine. He has a question regarding covid and upcoming activities for Thanksgiving

## 2020-06-23 ENCOUNTER — Telehealth: Payer: Self-pay

## 2020-06-23 NOTE — Telephone Encounter (Signed)
  Follow up Call-  Call back number 06/19/2020  Post procedure Call Back phone  # [phone]  Permission to leave phone message Yes  Some recent data might be hidden     Patient questions:  Do you have a fever, pain , or abdominal swelling? No. Pain Score  0 *  Have you tolerated food without any problems? Yes.    Have you been able to return to your normal activities? Yes.    Do you have any questions about your discharge instructions: Diet   No. Medications  No. Follow up visit  No.  Do you have questions or concerns about your Care? No.  Actions: * If pain score is 4 or above: No action needed, pain <4.  1. Have you developed a fever since your procedure? no  2.   Have you had an respiratory symptoms (SOB or cough) since your procedure? no  3.   Have you tested positive for COVID 19 since your procedure no  4.   Have you had any family members/close contacts diagnosed with the COVID 19 since your procedure?  no   If yes to any of these questions please route to Joylene John, RN and Joella Prince, RN

## 2020-06-29 ENCOUNTER — Telehealth: Payer: Self-pay | Admitting: Gastroenterology

## 2020-06-29 NOTE — Telephone Encounter (Signed)
[facility] received 10 pages of medical records from [place] to Dr. Tarri Glenn at [facility] 06/29/20  KLM

## 2020-06-30 ENCOUNTER — Encounter: Payer: Self-pay | Admitting: *Deleted

## 2020-06-30 ENCOUNTER — Ambulatory Visit: Payer: Medicare Other | Admitting: Gastroenterology

## 2020-06-30 ENCOUNTER — Other Ambulatory Visit: Payer: Self-pay | Admitting: *Deleted

## 2020-06-30 DIAGNOSIS — E1142 Type 2 diabetes mellitus with diabetic polyneuropathy: Secondary | ICD-10-CM

## 2020-06-30 MED ORDER — GABAPENTIN 600 MG PO TABS: 900.0000 mg | ORAL_TABLET | Freq: Two times a day (BID) | ORAL | 3 refills | Status: DC

## 2020-07-03 ENCOUNTER — Ambulatory Visit (INDEPENDENT_AMBULATORY_CARE_PROVIDER_SITE_OTHER): Payer: Medicare Other | Admitting: Gastroenterology

## 2020-07-03 ENCOUNTER — Encounter: Payer: Self-pay | Admitting: Gastroenterology

## 2020-07-03 VITALS — BP 120/64 | HR 68 | Ht 72.0 in | Wt 173.6 lb

## 2020-07-03 DIAGNOSIS — K529 Noninfective gastroenteritis and colitis, unspecified: Secondary | ICD-10-CM | POA: Diagnosis not present

## 2020-07-03 DIAGNOSIS — R194 Change in bowel habit: Secondary | ICD-10-CM | POA: Diagnosis not present

## 2020-07-03 DIAGNOSIS — K625 Hemorrhage of anus and rectum: Secondary | ICD-10-CM | POA: Diagnosis not present

## 2020-07-03 NOTE — Progress Notes (Signed)
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 Responding clinically to Rowasa enema. Reviewed natural history, diagnosis, and treatment options.   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 Perhaps cause was NSAIDs. EGD +/- UGI series if colonoscopy is negative.   Normocytic anemia with progression since 09/2019: Repeat labs as colitis improves.   PLAN: - Increase Lialda to 4.8 g daily - Avoid all NSAIDs - Continue Rowasa enemas QHS x 1 week, and then reduce to every other night for one week before stopping the medication - Consider trial of dicyclomine 20 mg QID PRN - Low threshold for UGI series +/- EGD given the history of duodenal stricture - Follow-up in 3-4 months, earlier if needed  Please see the "Patient Instructions" section for addition details about the plan.  HPI: Shannon Hodge is a 74 y.o. male who returns in scheduled follow-up after his recent colonoscopy to evaluated a 5 week history of abdominal pain, loose stools with urgency, bright red blood on the toilet paper, mucous in the stool, and associated flatulence that improved with empiric oral mesalamine, Nexium, probiotics, and fiber.  Symptoms started 4 months after initiation of treatment with meloxicam for back pain.   Since his last visit, I have reviewed old records from [facility].  - Colonoscopy with Dr. Wynetta Emery in 2013 showed mildly active chronic colitis in cecal biopsies and rectal biopsies.  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 Treated with mesalamine. Told to stop using ibuprofen.  - EGD and colonoscopy with Dr. [name]  11/01/16 for heme positive stools and a hemoglobin of 12.8. . EGD showed distal duodenal bulb benign stricture preventing intubation of the second portion duodenum. Colonoscopy was normal. Surveillance colonoscopy recommended in 10 years.   Labs 04/14/20: hemoglobin 12, MCV 95, RDW 11.9, platelets 177 Hemoglobin was 13.5 in 09/2019-  Colonoscopy 06/19/20 showed  congested, erythematous and ulcerated mucosa in the rectum and in the recto-sigmoid colon.   Pathology results showed: 1. Surgical [P], small bowel, terminal ileum - UNREMARKABLE ILEAL MUCOSA. - NO ACTIVE INFLAMMATION, CHRONIC CHANGES OR GRANULOMAS. 2. Surgical [P], right colon - MINIMALLY ACTIVE CHRONIC COLITIS. - NEGATIVE FOR DYSPLASIA. - SEE MICROSCOPIC DESCRIPTION 3. Surgical [P], colon, transverse - UNREMARKABLE COLONIC MUCOSA. - NO ACTIVE INFLAMMATION, CHRONIC CHANGES OR GRANULOMAS. - NEGATIVE FOR DYSPLASIA. 4. Surgical [P], left colon - UNREMARKABLE COLONIC MUCOSA. - NO ACTIVE INFLAMMATION, CHRONIC CHANGES OR GRANULOMAS. - NEGATIVE FOR DYSPLASIA. 5. Surgical [P], colon, sigmoid - UNREMARKABLE COLONIC MUCOSA. - NO ACTIVE INFLAMMATION, CHRONIC CHANGES OR GRANULOMAS. 6. Surgical [P], colon, rectum - MODERATELY ACTIVE CHRONIC COLITIS. - NEGATIVE FOR DYSPLASIA. - SEE MICROSCOPIC DESCRIPTION.  Feeling better following his endoscopy.  He reports 100% adherence with Rowasa BID. Bleeding stopped after 5 days of Rowasa.  He has gained 5 pounds. He has resumed a normal diet. Bowels are becoming more regular. Having some challenges with enemas.   Labs 06/05/20: CRP <1, ESR 4, fecal calproectin 106  Past Medical History:  Diagnosis Date  . Cataract    right eye  . Diabetes ([place])    type 2  . Diabetic neuropathy (H[facility])    both legs  .  DJD (degenerative joint disease)    L 5  . Hypercholesteremia   . Hyperlipidemia   . Hypertension   . Neuropathy   . Ruptured Achilles tendon due to trauma 10/2016  . Ulcerative proctitis  [facility])     Past Surgical History:  Procedure Laterality Date  . COLONOSCOPY WITH PROPOFOL N/A 11/01/2016   Procedure: COLONOSCOPY WITH PROPOFOL;  Surgeon: Garlan Fair, MD;  Location: [facility];  Service: Endoscopy;  Laterality: N/A;  . colonscopy     x 2  . ESOPHAGOGASTRODUODENOSCOPY (EGD) WITH PROPOFOL N/A 11/01/2016   Procedure: ESOPHAGOGASTRODUODENOSCOPY (EGD) WITH PROPOFOL;  Surgeon: Garlan Fair, MD;  Location: [facility];  Service: Endoscopy;  Laterality: N/A;  . TONSILLECTOMY      Current Outpatient Medications  Medication Sig Dispense Refill  . mesalamine (LIALDA) 1.2 g EC tablet TAKE 1 TABLET (1.2 G TOTAL) BY MOUTH DAILY WITH BREAKFAST. 30 tablet 0  . acetaminophen (TYLENOL) 500 MG tablet Take 500 mg by mouth daily as needed for moderate pain or headache.    Marland Kitchen aspirin 325 MG tablet Take 162.5 mg by mouth daily.  (Patient not taking: Reported on 05/05/2020)    . cholecalciferol (VITAMIN D) 1000 units tablet Take 1,000 Units by mouth daily.    . Coenzyme Q10 (COQ10) 100 MG CAPS     . Cyanocobalamin (VITAMIN B-12 PO) Take by mouth daily.    . fenofibrate micronized (LOFIBRA) 134 MG capsule Take 1 capsule (134 mg total) by mouth daily before breakfast. 90 capsule 3  . gabapentin (NEURONTIN) 600 MG tablet Take 1.5 tablets (900 mg total) by mouth 2 (two) times daily. 270 tablet 3  . lisinopril (ZESTRIL) 20 MG tablet Take 1 tablet (20 mg total) by mouth daily. 90 tablet 3  . mesalamine (ROWASA) 4 g enema Place 60 mLs (4 g total) rectally 2 (two) times daily for 22 days. 1 per rectum BID for 2 weeks, then reduce to per rectum daily. 2640 mL 0  . metFORMIN (GLUCOPHAGE-XR) 500 MG 24 hr tablet Take 4 tablets (2,000 mg total) by mouth daily. 360 tablet 3  . simvastatin (ZOCOR) 40 MG tablet TAKE 1 TABLET BY MOUTH EVERYDAY AT BEDTIME 90 tablet 3  . valACYclovir (VALTREX) 500 MG tablet Take 500 mg by mouth daily as needed (outbreak).   2   No current facility-administered medications  for this visit.    Allergies as of 07/03/2020  . (No Known Allergies)    Family History  Problem Relation Age of Onset  . Pancreatic cancer Mother   . Stroke Father   . Brain cancer Father   . Diabetes Father   . Healthy Daughter   . Healthy Son   . Diabetes Maternal Grandmother   . Diabetes Maternal Grandfather   . Diabetes Paternal Grandmother   . Diabetes Paternal Grandfather   . Colon cancer Neg Hx   . Colon polyps Neg Hx   . Esophageal cancer Neg Hx   . Stomach cancer Neg Hx     Social History   Socioeconomic History  . Marital status: Married    Spouse name: Diane   . Number of children: 3  . Years of education: post grad  . Highest education level: Not on file  Occupational History  . Occupation: retired English as a second language teacher  Tobacco Use  . Smoking status: Never Smoker  . Smokeless tobacco: Never Used  Vaping Use  . Vaping Use: Never used  Substance and Sexual Activity  . Alcohol use: No  Alcohol/week: 0.0 standard drinks  . Drug use: No  . Sexual activity: Yes  Other Topics Concern  . Not on file  Social History Narrative   Lives with wife    Drinks 2 cups of coffee a day   Social Determinants of Health   Financial Resource Strain:   . Difficulty of Paying Living Expenses: Not on file  Food Insecurity:   . Worried About Charity fundraiser in the Last Year: Not on file  . Ran Out of Food in the Last Year: Not on file  Transportation Needs:   . Lack of Transportation (Medical): Not on file  . Lack of Transportation (Non-Medical): Not on file  Physical Activity:   . Days of Exercise per Week: Not on file  . Minutes of Exercise per Session: Not on file  Stress:   . Feeling of Stress : Not on file  Social Connections:   . Frequency of Communication with Friends and Family: Not on file  . Frequency of Social Gatherings with Friends and Family: Not on file  . Attends Religious Services: Not on file  . Active Member of Clubs or Organizations: Not on file   . Attends Archivist Meetings: Not on file  . Marital Status: Not on file  Intimate Partner Violence:   . Fear of Current or Ex-Partner: Not on file  . Emotionally Abused: Not on file  . Physically Abused: Not on file  . Sexually Abused: Not on file    Physical Exam: General:   Alert,  well-nourished, pleasant and cooperative in NAD Head:  Normocephalic and atraumatic. Eyes:  Sclera clear, no icterus.   Conjunctiva pink. Abdomen:  Soft, nontender, nondistended, normal bowel sounds, no rebound or guarding. No hepatosplenomegaly.   Neurologic:  Alert and  oriented x4;  grossly nonfocal Skin:  Intact without significant lesions or rashes. Psych:  Alert and cooperative. Normal mood and affect.   Aydden Cumpian L. Tarri Glenn, MD, MPH 07/03/2020, 10:56 AM

## 2020-07-03 NOTE — Patient Instructions (Addendum)
If you are age 72 or older, your body mass index should be between 23-30. Your Body mass index is 23.54 kg/m. If this is out of the aforementioned range listed, please consider follow up with your Primary Care Provider.  If you are age 23 or younger, your body mass index should be between 19-25. Your Body mass index is 23.54 kg/m. If this is out of the aformentioned range listed, please consider follow up with your Primary Care Provider.   I am delighted that you are starting to feel better.  I recommend that you increase your Lialda to 4.8 grams daily (4 pills taken together every morning) and reduce your Rowasa to using this only at night. After one week, reduce the Rowasa to every other night before ultimately stopping the Rowasa enemas.   Please avoid all non-steroidal anti-inflammatory medications (including meloxicam) - as these can trigger colitis.   You may resume your amitiptyline and your baby ASA if Dr. Jonni Sanger finds these to be clinically significant.   Please call to make an appointment in 3-4 months, or earlier as needed.   It was a pleasure to see you today!  Dr. Tarri Glenn

## 2020-07-07 ENCOUNTER — Other Ambulatory Visit: Payer: Self-pay

## 2020-07-07 ENCOUNTER — Telehealth: Payer: Self-pay | Admitting: Gastroenterology

## 2020-07-07 DIAGNOSIS — K529 Noninfective gastroenteritis and colitis, unspecified: Secondary | ICD-10-CM

## 2020-07-07 MED ORDER — MESALAMINE 1.2 G PO TBEC: 4.8000 g | DELAYED_RELEASE_TABLET | Freq: Every day | ORAL | 3 refills | Status: DC

## 2020-07-07 NOTE — Telephone Encounter (Signed)
Outpatient Medication Detail   Disp Refills Start End   mesalamine (LIALDA) 1.2 g EC tablet 120 tablet 3 07/07/2020    Sig - Route: Take 4 tablets (4.8 g total) by mouth daily with breakfast. - Oral   Sent to pharmacy as: mesalamine (LIALDA) 1.2 g EC tablet   E-Prescribing Status: Receipt confirmed by pharmacy (07/07/2020  3:49 PM EST)

## 2020-07-07 NOTE — Telephone Encounter (Signed)
Inbound call from patient stating he saw Dr. Tarri Glenn on 07/03/20 where she increased dosage on Lialda medication but states medication was not sent to pharmacy.  Please advise.

## 2020-07-09 ENCOUNTER — Other Ambulatory Visit: Payer: Self-pay | Admitting: Gastroenterology

## 2020-07-09 DIAGNOSIS — R194 Change in bowel habit: Secondary | ICD-10-CM

## 2020-07-17 ENCOUNTER — Other Ambulatory Visit: Payer: Self-pay

## 2020-07-17 DIAGNOSIS — K529 Noninfective gastroenteritis and colitis, unspecified: Secondary | ICD-10-CM

## 2020-07-17 MED ORDER — MESALAMINE 1.2 G PO TBEC: 4.8000 g | DELAYED_RELEASE_TABLET | Freq: Every day | ORAL | 1 refills | Status: DC

## 2020-07-20 ENCOUNTER — Other Ambulatory Visit: Payer: Self-pay

## 2020-07-20 MED ORDER — MESALAMINE 1.2 G PO TBEC: 4.8000 g | DELAYED_RELEASE_TABLET | Freq: Every day | ORAL | 3 refills | Status: DC

## 2020-07-24 ENCOUNTER — Other Ambulatory Visit: Payer: Self-pay | Admitting: Family Medicine

## 2020-07-24 DIAGNOSIS — E1142 Type 2 diabetes mellitus with diabetic polyneuropathy: Secondary | ICD-10-CM

## 2020-08-04 ENCOUNTER — Telehealth: Payer: Self-pay

## 2020-08-04 NOTE — Telephone Encounter (Signed)
.   LAST APPOINTMENT DATE: 09/14//2021   NEXT APPOINTMENT DATE:@3 /28/2022  MEDICATION: metFORMIN (GLUCOPHAGE-XR) 500 MG 24 hr tablet  lisinopril (ZESTRIL) 20 MG tablet  simvastatin (ZOCOR) 40 MG tablet  fenofibrate micronized (LOFIBRA) 134 MG capsule    PHARMACY:[facility]) - [place], [place] - [address]  CLINICAL FILLS OUT ALL BELOW:   LAST REFILL:  QTY:  REFILL DATE:    OTHER COMMENTSDoristine Church ID # [id number]   Okay for refill?  Please advise

## 2020-08-05 ENCOUNTER — Other Ambulatory Visit: Payer: Self-pay

## 2020-08-05 DIAGNOSIS — E782 Mixed hyperlipidemia: Secondary | ICD-10-CM

## 2020-08-05 DIAGNOSIS — E1142 Type 2 diabetes mellitus with diabetic polyneuropathy: Secondary | ICD-10-CM

## 2020-08-05 DIAGNOSIS — I10 Essential (primary) hypertension: Secondary | ICD-10-CM

## 2020-08-05 MED ORDER — SIMVASTATIN 40 MG PO TABS: ORAL_TABLET | ORAL | 3 refills | Status: DC

## 2020-08-05 MED ORDER — METFORMIN HCL ER 500 MG PO TB24: 2000.0000 mg | ORAL_TABLET | Freq: Every day | ORAL | 3 refills | Status: DC

## 2020-08-05 MED ORDER — LISINOPRIL 20 MG PO TABS: 20.0000 mg | ORAL_TABLET | Freq: Every day | ORAL | 3 refills | Status: DC

## 2020-08-05 MED ORDER — FENOFIBRATE MICRONIZED 134 MG PO CAPS: 134.0000 mg | ORAL_CAPSULE | Freq: Every day | ORAL | 3 refills | Status: DC

## 2020-08-05 NOTE — Telephone Encounter (Signed)
All four scripts refilled

## 2020-08-06 DIAGNOSIS — M48061 Spinal stenosis, lumbar region without neurogenic claudication: Secondary | ICD-10-CM | POA: Diagnosis not present

## 2020-08-06 DIAGNOSIS — M545 Low back pain, unspecified: Secondary | ICD-10-CM | POA: Diagnosis not present

## 2020-08-06 DIAGNOSIS — M47816 Spondylosis without myelopathy or radiculopathy, lumbar region: Secondary | ICD-10-CM | POA: Diagnosis not present

## 2020-08-10 ENCOUNTER — Other Ambulatory Visit: Payer: Self-pay | Admitting: *Deleted

## 2020-08-10 MED ORDER — AMITRIPTYLINE HCL 25 MG PO TABS: 25.0000 mg | ORAL_TABLET | Freq: Every day | ORAL | 3 refills | Status: DC

## 2020-08-12 ENCOUNTER — Telehealth: Payer: Self-pay | Admitting: Diagnostic Neuroimaging

## 2020-08-12 MED ORDER — AMITRIPTYLINE HCL 25 MG PO TABS: 25.0000 mg | ORAL_TABLET | Freq: Every day | ORAL | 1 refills | Status: DC

## 2020-08-12 NOTE — Telephone Encounter (Signed)
Hope @ [facility] is asking for a 90 day on pt's  amitriptyline (ELAVIL) 25 MG tablet.  If this is an option for pt Hope gave their fax# as [phone]

## 2020-08-12 NOTE — Addendum Note (Signed)
Addended by: Minna Antis on: 08/12/2020 10:21 AM   Modules accepted: Orders

## 2020-08-12 NOTE — Telephone Encounter (Signed)
90 days Rx sent to mail order pharmacy as requested.

## 2020-08-19 DIAGNOSIS — M47816 Spondylosis without myelopathy or radiculopathy, lumbar region: Secondary | ICD-10-CM | POA: Diagnosis not present

## 2020-08-25 ENCOUNTER — Encounter: Payer: Self-pay | Admitting: Family Medicine

## 2020-08-31 ENCOUNTER — Other Ambulatory Visit: Payer: Self-pay

## 2020-08-31 DIAGNOSIS — L820 Inflamed seborrheic keratosis: Secondary | ICD-10-CM | POA: Diagnosis not present

## 2020-08-31 DIAGNOSIS — L570 Actinic keratosis: Secondary | ICD-10-CM | POA: Diagnosis not present

## 2020-08-31 DIAGNOSIS — E1142 Type 2 diabetes mellitus with diabetic polyneuropathy: Secondary | ICD-10-CM

## 2020-08-31 DIAGNOSIS — E782 Mixed hyperlipidemia: Secondary | ICD-10-CM

## 2020-08-31 DIAGNOSIS — I10 Essential (primary) hypertension: Secondary | ICD-10-CM

## 2020-08-31 MED ORDER — SIMVASTATIN 40 MG PO TABS: ORAL_TABLET | ORAL | 3 refills | Status: DC

## 2020-08-31 MED ORDER — METFORMIN HCL ER 500 MG PO TB24: 2000.0000 mg | ORAL_TABLET | Freq: Every day | ORAL | 3 refills | Status: DC

## 2020-08-31 MED ORDER — LISINOPRIL 20 MG PO TABS: 20.0000 mg | ORAL_TABLET | Freq: Every day | ORAL | 3 refills | Status: DC

## 2020-08-31 MED ORDER — FENOFIBRATE MICRONIZED 134 MG PO CAPS: 134.0000 mg | ORAL_CAPSULE | Freq: Every day | ORAL | 3 refills | Status: DC

## 2020-09-10 DIAGNOSIS — M47816 Spondylosis without myelopathy or radiculopathy, lumbar region: Secondary | ICD-10-CM | POA: Diagnosis not present

## 2020-09-10 DIAGNOSIS — M48061 Spinal stenosis, lumbar region without neurogenic claudication: Secondary | ICD-10-CM | POA: Diagnosis not present

## 2020-09-10 DIAGNOSIS — M545 Low back pain, unspecified: Secondary | ICD-10-CM | POA: Diagnosis not present

## 2020-09-30 ENCOUNTER — Encounter: Payer: Medicare Other | Admitting: Family Medicine

## 2020-10-01 ENCOUNTER — Ambulatory Visit: Payer: Medicare Other | Admitting: Family Medicine

## 2020-10-06 ENCOUNTER — Encounter: Payer: Self-pay | Admitting: Gastroenterology

## 2020-10-06 ENCOUNTER — Telehealth (INDEPENDENT_AMBULATORY_CARE_PROVIDER_SITE_OTHER): Payer: PPO | Admitting: Gastroenterology

## 2020-10-06 ENCOUNTER — Ambulatory Visit: Payer: Medicare Other | Admitting: Gastroenterology

## 2020-10-06 ENCOUNTER — Other Ambulatory Visit: Payer: Self-pay

## 2020-10-06 VITALS — Ht 72.0 in | Wt 175.0 lb

## 2020-10-06 DIAGNOSIS — L570 Actinic keratosis: Secondary | ICD-10-CM | POA: Diagnosis not present

## 2020-10-06 DIAGNOSIS — K529 Noninfective gastroenteritis and colitis, unspecified: Secondary | ICD-10-CM

## 2020-10-06 DIAGNOSIS — D485 Neoplasm of uncertain behavior of skin: Secondary | ICD-10-CM | POA: Diagnosis not present

## 2020-10-06 DIAGNOSIS — C44519 Basal cell carcinoma of skin of other part of trunk: Secondary | ICD-10-CM | POA: Diagnosis not present

## 2020-10-06 DIAGNOSIS — L820 Inflamed seborrheic keratosis: Secondary | ICD-10-CM | POA: Diagnosis not present

## 2020-10-06 MED ORDER — MESALAMINE 1.2 G PO TBEC: 4.8000 g | DELAYED_RELEASE_TABLET | Freq: Every day | ORAL | 1 refills | Status: DC

## 2020-10-06 NOTE — Patient Instructions (Addendum)
It was a pleasure to talk to you today. Based on our discussion, I am providing you with my recommendations below:  RECOMMENDATION(S):   I am recommending that you have your labs completed with your PCP's office. I am sending a refill of the Lialda. Please discuss more affordable options with your insurance company or with your pharmacy. Continue using Metamucil as needed. Should your symptoms return, we may want to consider a trial of Dicyclomine. I will plan to see you as needed. Please call the office should your symptoms worsen or return.  FOLLOW UP:  . I would like for you to follow up with me in as needed. Please call the office at [phone] to schedule an appointment as needed.  BMI:  . If you are age 61 or older, your body mass index should be between 23-30. Your Body mass index is 23.73 kg/m. If this is out of the aforementioned range listed, please consider follow up with your Primary Care Provider.  Thank you for trusting me with your gastrointestinal care!    Thornton Park, MD, MPH

## 2020-10-06 NOTE — Progress Notes (Signed)
TELEHEALTH VISIT  Referring Provider: [name], MD Primary Care Physician:  [name], MD  Tele-visit due to COVID-19 pandemic Patient requested visit virtually, consented to the virtual encounter via audio enabled telemedicine application Contact made at: 09:00 10/06/20 Patient verified by name and date of birth Location of patient: Home Location provider: [facility] Names of persons participating: Me, patient, Aimmee LPN I discussed the limitations of evaluation and management by telemedicine. The patient expressed understanding and agreed to proceed.   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 Clinically improved on oral mesalamine and rowasa enema. Reviewed natural history, diagnosis, and treatment options. He would like to stop mesalamine due to cost. We discussed a taper with resuming treatment if symptoms recur.   Normocytic anemia with progression since 09/2019: Repeat labs.   History of duodenal stricture on EGD 2018: Less likely to explain recent symptoms , but, should be further evaluated as it may be contributing. Perhaps cause was NSAIDs. EGD +/- UGI series if colonoscopy is negative.   PLAN: - He will taper the Lialda to 4.8 g daily, although requested an alternative mesalamine given the cost - Resume mesalamine if symptoms recur - Iron, ferritin, CBC - he requested having them performed with his upcoming visit with Dr. Jonni Sanger - Avoid all NSAIDs, discussed potential GI side effects of tumeric - Continue to use Metumucil PRN - Consider trial of dicyclomine 20 mg QID PRN if symptoms return - Follow-up PRN  I spent 30 minutes, including in depth chart review, independent review of results, communicating results with the patient directly, face-to-face time with the patient, coordinating  care, and ordering studies and medications as appropriate, and documentation.  HPI: Shannon Hodge is a 75 y.o. male who returns in scheduled follow-up of inflammatory bowel disease. Initially diagnosed in 2013 by Dr. Wynetta Emery with colonoscopy showing mildly active chronic colitis in cecal biopsies and rectal biopsies.  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 Treated with mesalamine. Told to stop using ibuprofen.   I initially met him after a 5 week history of abdominal pain, loose stools with urgency, bright red blood on the toilet paper, mucous in the stool, and associated flatulence that improved with empiric oral mesalamine, Nexium, probiotics, and fiber.  Symptoms started 4 months after initiation of treatment with meloxicam for back pain.   Colonoscopy 06/19/20 showed  congested, erythematous and ulcerated mucosa in the rectum and in the recto-sigmoid colon.  Ileal biopsies were normal. Colon biopsies showed minimally active chronic colitis in the right colon and rectum, but, were otherwise normal.   At the time of his last office visit 07/03/20 he was feeling better with Rowasa BID. Bleeding stopped after 5 days of Rowasa.  He has gained 5 pounds. He has resumed a normal diet. Bowels are becoming more regular. Having some challenges with enemas.   He is seen today in virtual follow-up. He continues to do well.   Labs 06/05/20: CRP <1, ESR 4, fecal calproectin 106, GI pathogen panel negative  Endoscopic history:  - Colonoscopy with Dr. Wynetta Emery in 2013 showed mildly active chronic colitis in cecal biopsies and rectal biopsies.  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 Treated with mesalamine. Told to stop using ibuprofen.  - EGD and colonoscopy with Dr. [name] 11/01/16 for heme positive stools and a  hemoglobin of 12.8. . EGD showed distal duodenal bulb benign stricture preventing intubation of the second portion duodenum.  Colonoscopy was normal. Surveillance colonoscopy recommended in 10 years.  - Colonoscopy 06/19/20 showed  congested, erythematous and ulcerated mucosa in the rectum and in the recto-sigmoid colon.   Past Medical History:  Diagnosis Date  . Cataract    right eye  . Diabetes ([place])    type 2  . Diabetic neuropathy (HCC)    both legs  . DJD (degenerative joint disease)    L 5  . Hypercholesteremia   . Hyperlipidemia   . Hypertension   . Neuropathy   . Ruptured Achilles tendon due to trauma 10/2016  . Ulcerative proctitis [facility])     Past Surgical History:  Procedure Laterality Date  . COLONOSCOPY WITH PROPOFOL N/A 11/01/2016   Procedure: COLONOSCOPY WITH PROPOFOL;  Surgeon: Garlan Fair, MD;  Location: [facility];  Service: Endoscopy;  Laterality: N/A;  . colonscopy     x 2  . ESOPHAGOGASTRODUODENOSCOPY (EGD) WITH PROPOFOL N/A 11/01/2016   Procedure: ESOPHAGOGASTRODUODENOSCOPY (EGD) WITH PROPOFOL;  Surgeon: Garlan Fair, MD;  Location: [facility];  Service: Endoscopy;  Laterality: N/A;  . TONSILLECTOMY      Current Outpatient Medications  Medication Sig Dispense Refill  . acetaminophen (TYLENOL) 500 MG tablet Take 500 mg by mouth daily as needed for moderate pain or headache.    Marland Kitchen amitriptyline (ELAVIL) 25 MG tablet Take 1 tablet (25 mg total) by mouth at bedtime. 90 tablet 1  . aspirin 325 MG tablet Take 162.5 mg by mouth daily.  (Patient not taking: Reported on 05/05/2020)    . cholecalciferol (VITAMIN D) 1000 units tablet Take 1,000 Units by mouth daily.    . Coenzyme Q10 (COQ10) 100 MG CAPS     . Cyanocobalamin (VITAMIN B-12 PO) Take by mouth daily.    . fenofibrate micronized (LOFIBRA) 134 MG capsule Take 1 capsule (134 mg total) by mouth daily before breakfast. 90 capsule 3  . gabapentin (NEURONTIN) 600 MG tablet Take 1.5 tablets (900 mg total) by mouth 2 (two) times daily. 270 tablet 3  . lisinopril (ZESTRIL) 20 MG tablet Take 1 tablet (20 mg total) by mouth daily.  90 tablet 3  . mesalamine (LIALDA) 1.2 g EC tablet Take 4 tablets (4.8 g total) by mouth daily with breakfast. 360 tablet 1  . mesalamine (LIALDA) 1.2 g EC tablet Take 4 tablets (4.8 g total) by mouth daily with breakfast. 360 tablet 3  . mesalamine (ROWASA) 4 g enema Place 60 mLs (4 g total) rectally 2 (two) times daily for 22 days. 1 per rectum BID for 2 weeks, then reduce to per rectum daily. 2640 mL 0  . metFORMIN (GLUCOPHAGE-XR) 500 MG 24 hr tablet Take 4 tablets (2,000 mg total) by mouth daily. 360 tablet 3  . simvastatin (ZOCOR) 40 MG tablet TAKE 1 TABLET BY MOUTH EVERYDAY AT BEDTIME 90 tablet 3  . valACYclovir (VALTREX) 500 MG tablet Take 500 mg by mouth daily as needed (outbreak).   2   No current facility-administered medications for this visit.    Allergies as of 10/06/2020  . (No Known Allergies)    Family History  Problem Relation Age of Onset  . Pancreatic cancer Mother   . Stroke Father   . Brain cancer Father   . Diabetes Father   . Healthy Daughter   . Healthy Son   . Diabetes Maternal Grandmother   . Diabetes Maternal Grandfather   .  Diabetes Paternal Grandmother   . Diabetes Paternal Grandfather   . Colon cancer Neg Hx   . Colon polyps Neg Hx   . Esophageal cancer Neg Hx   . Stomach cancer Neg Hx      Physical Exam: Complete physical exam not performed due to the limits inherent in a telehealth encounter.  Gen: Awake, alert, and oriented, and well communicative. HEENT: EOMI, non-icteric sclera, NCAT, MMM  Neck: Normal movement of head and neck  Pulm: No labored breathing, speaking in full sentences without conversational dyspnea  Derm: No apparent lesions or bruising in visible field  MS: Moves all visible extremities without noticeable abnormality  Psych: Pleasant, cooperative, normal speech, thought processing seemingly intact     Kimberly L. Tarri Glenn, MD, MPH 10/06/2020, 8:34 AM

## 2020-10-12 ENCOUNTER — Other Ambulatory Visit: Payer: Self-pay

## 2020-10-12 DIAGNOSIS — K529 Noninfective gastroenteritis and colitis, unspecified: Secondary | ICD-10-CM

## 2020-10-12 MED ORDER — MESALAMINE 1.2 G PO TBEC: DELAYED_RELEASE_TABLET | ORAL | 0 refills | Status: DC

## 2020-10-21 DIAGNOSIS — C44519 Basal cell carcinoma of skin of other part of trunk: Secondary | ICD-10-CM | POA: Diagnosis not present

## 2020-10-21 DIAGNOSIS — L905 Scar conditions and fibrosis of skin: Secondary | ICD-10-CM | POA: Diagnosis not present

## 2020-10-26 ENCOUNTER — Encounter: Payer: Self-pay | Admitting: Family Medicine

## 2020-10-26 ENCOUNTER — Other Ambulatory Visit: Payer: Self-pay

## 2020-10-26 ENCOUNTER — Ambulatory Visit (INDEPENDENT_AMBULATORY_CARE_PROVIDER_SITE_OTHER): Payer: PPO | Admitting: Family Medicine

## 2020-10-26 VITALS — BP 116/68 | Temp 97.2°F | Ht 72.0 in | Wt 181.2 lb

## 2020-10-26 DIAGNOSIS — E782 Mixed hyperlipidemia: Secondary | ICD-10-CM | POA: Diagnosis not present

## 2020-10-26 DIAGNOSIS — G250 Essential tremor: Secondary | ICD-10-CM

## 2020-10-26 DIAGNOSIS — M48062 Spinal stenosis, lumbar region with neurogenic claudication: Secondary | ICD-10-CM

## 2020-10-26 DIAGNOSIS — G2581 Restless legs syndrome: Secondary | ICD-10-CM

## 2020-10-26 DIAGNOSIS — K5190 Ulcerative colitis, unspecified, without complications: Secondary | ICD-10-CM | POA: Insufficient documentation

## 2020-10-26 DIAGNOSIS — I10 Essential (primary) hypertension: Secondary | ICD-10-CM | POA: Diagnosis not present

## 2020-10-26 DIAGNOSIS — F5101 Primary insomnia: Secondary | ICD-10-CM

## 2020-10-26 DIAGNOSIS — E538 Deficiency of other specified B group vitamins: Secondary | ICD-10-CM

## 2020-10-26 DIAGNOSIS — K529 Noninfective gastroenteritis and colitis, unspecified: Secondary | ICD-10-CM | POA: Diagnosis not present

## 2020-10-26 DIAGNOSIS — R252 Cramp and spasm: Secondary | ICD-10-CM

## 2020-10-26 DIAGNOSIS — E1142 Type 2 diabetes mellitus with diabetic polyneuropathy: Secondary | ICD-10-CM

## 2020-10-26 HISTORY — DX: Noninfective gastroenteritis and colitis, unspecified: K52.9

## 2020-10-26 LAB — COMPREHENSIVE METABOLIC PANEL
ALT: 21 U/L (ref 0–53)
AST: 15 U/L (ref 0–37)
Albumin: 4.8 g/dL (ref 3.5–5.2)
Alkaline Phosphatase: 49 U/L (ref 39–117)
BUN: 22 mg/dL (ref 6–23)
CO2: 33 mEq/L — ABNORMAL HIGH (ref 19–32)
Calcium: 9.9 mg/dL (ref 8.4–10.5)
Chloride: 101 mEq/L (ref 96–112)
Creatinine, Ser: 1.21 mg/dL (ref 0.40–1.50)
GFR: 59.92 mL/min — ABNORMAL LOW (ref >60.00)
Glucose, Bld: 139 mg/dL — ABNORMAL HIGH (ref 70–99)
Potassium: 4.2 mEq/L (ref 3.5–5.1)
Sodium: 141 mEq/L (ref 135–145)
Total Bilirubin: 0.8 mg/dL (ref 0.2–1.2)
Total Protein: 6.7 g/dL (ref 6.0–8.3)

## 2020-10-26 LAB — CBC WITH DIFFERENTIAL/PLATELET
Basophils Absolute: 0 10*3/uL (ref 0.0–0.1)
Basophils Relative: 0.9 % (ref 0.0–3.0)
Eosinophils Absolute: 0.3 10*3/uL (ref 0.0–0.7)
Eosinophils Relative: 6.1 % — ABNORMAL HIGH (ref 0.0–5.0)
HCT: 37.5 % — ABNORMAL LOW (ref 39.0–52.0)
Hemoglobin: 12.7 g/dL — ABNORMAL LOW (ref 13.0–17.0)
Lymphocytes Relative: 23.9 % (ref 12.0–46.0)
Lymphs Abs: 1.1 10*3/uL (ref 0.7–4.0)
MCHC: 33.9 g/dL (ref 30.0–36.0)
MCV: 91.3 fl (ref 78.0–100.0)
Monocytes Absolute: 0.3 10*3/uL (ref 0.1–1.0)
Monocytes Relative: 6.7 % (ref 3.0–12.0)
Neutro Abs: 2.9 10*3/uL (ref 1.4–7.7)
Neutrophils Relative %: 62.4 % (ref 43.0–77.0)
Platelets: 193 10*3/uL (ref 150.0–400.0)
RBC: 4.1 Mil/uL — ABNORMAL LOW (ref 4.22–5.81)
RDW: 12.9 % (ref 11.5–15.5)
WBC: 4.7 10*3/uL (ref 4.0–10.5)

## 2020-10-26 LAB — POCT GLYCOSYLATED HEMOGLOBIN (HGB A1C): Hemoglobin A1C: 7.9 % — AB (ref 4.0–5.6)

## 2020-10-26 LAB — LIPID PANEL
Cholesterol: 152 mg/dL (ref 0–200)
HDL: 51.3 mg/dL (ref >39.00)
LDL Cholesterol: 76 mg/dL (ref 0–99)
NonHDL: 100.83
Total CHOL/HDL Ratio: 3
Triglycerides: 122 mg/dL (ref 0.0–149.0)
VLDL: 24.4 mg/dL (ref 0.0–40.0)

## 2020-10-26 LAB — VITAMIN B12: Vitamin B-12: 738 pg/mL (ref 211–911)

## 2020-10-26 NOTE — Patient Instructions (Addendum)
Please return in 3 months for diabetes follow up  I will release your lab results to you on your MyChart account with further instructions. Please reply with any questions.    Work on your diabetes and we will check again in 3 months.  If you have any questions or concerns, please don't hesitate to send me a message via MyChart or call the office at [phone]. Thank you for visiting with [place] today! It's our pleasure caring for you.

## 2020-10-26 NOTE — Progress Notes (Signed)
Subjective  Chief Complaint  Patient presents with  . Annual Exam    Fasting   . Basal Cell Carcinoma    Located on back, removed two weeks ago by Dr. Anabel Bene     HPI: Shannon Hodge is a 75 y.o. male who presents to [facility] [place] today for a Male Wellness Visit. He also has the concerns and/or needs as listed above in the chief complaint. These will be addressed in addition to the Health Maintenance Visit.   Wellness Visit: annual visit with health maintenance review and exam    HM: screens up to date. imms up to date.   Body mass index is 24.58 kg/m. Wt Readings from Last 3 Encounters:  10/26/20 181 lb 3.2 oz (82.2 kg)  10/06/20 175 lb (79.4 kg)  07/03/20 173 lb 9.6 oz (78.7 kg)     Chronic disease management visit and/or acute problem visit:  DM: Reports he has been eating richer diet but more importantly has not been exercising since he has been dealing with his GI issues.  He has a bit of a dry mouth but no other symptoms of hyperglycemia.  No foot lesions.  Peripheral neuropathy is stable on medications.  Eye exam is up-to-date.  Immunizations are up-to-date.  He remains on an ACE inhibitor and a statin.  HTN: Very well controlled on low-dose ACE inhibitor.  No chest pain or shortness of breath  HLD: He decreased his simvastatin to 20 mg daily down from 40.  He thought this could be contributing to muscle aches or weakness, however it has not made any difference.  He would be willing to restart higher dose.  Neuropathy: Continues on Elavil and gabapentin with fairly good control  Colitis: Reviewed recent GI evaluation including endoscopies and visits.  He is taking medication per GI to help manage his symptoms.  Symptoms are much better controlled.  He does not feel that Metformin is contributing to diarrhea.  Is been able to regain his weight.  He is feeling much better.  Vitamin B12 deficiency on supplements.  Tremor: Reports right-handed  tremors worsening with certain activities.  Cannot eat soup anymore due to shaking.  He does see a neurologist.  Has not tried medications.  No tremor at rest.   Lab Results  Component Value Date   HGBA1C 7.9 (A) 10/26/2020   HGBA1C 6.1 (A) 04/01/2020   HGBA1C 6.1 (A) 10/07/2019     Patient Active Problem List   Diagnosis Date Noted  . Inflammatory bowel disease 10/26/2020  . Essential hypertension 04/01/2020  . Spinal stenosis of lumbar region 04/01/2020  . Diabetic polyneuropathy associated with type 2 diabetes mellitus ([place]) 01/01/2019  . Restless leg syndrome 09/21/2017  . Mixed hyperlipidemia 09/21/2017  . Controlled type 2 diabetes mellitus with diabetic polyneuropathy, without long-term current use of insulin ([place]) 09/01/2016  . Insomnia 02/22/2016  . Lumbar radiculopathy 11/04/2014  . Hearing loss 09/21/2017  . Muscle cramp, nocturnal 09/21/2017  . Foot drop, left 09/21/2017  . 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 Vitamin B12 deficiency 09/21/2017  . Allergic rhinitis 02/22/2016  . Genital herpes 02/22/2016  . Achilles rupture, right 04/01/2020  . 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   Health Maintenance  Topic Date Due  . TETANUS/TDAP  01/26/2021  . OPHTHALMOLOGY EXAM  04/01/2021  . HEMOGLOBIN A1C  04/28/2021  . FOOT EXAM  10/26/2021  . COLONOSCOPY (Pts 45-33yrs Insurance coverage will need to be confirmed)  06/19/2030  . INFLUENZA VACCINE  Completed  .  COVID-19 Vaccine  Completed  . Hepatitis C Screening  Completed  . PNA vac Low Risk Adult  Completed  . HPV VACCINES  Aged Out   Immunization History  Administered Date(s) Administered  . Fluad Quad(high Dose 65+) 03/30/2020  . Influenza, High Dose Seasonal PF 05/09/2016, 05/04/2017, 04/12/2018, 03/30/2020  . Influenza,inj,Quad PF,6+ Mos 04/05/2019  . Influenza-Unspecified 05/01/2015  . PFIZER(Purple Top)SARS-COV-2 Vaccination 08/22/2019, 09/12/2019, 03/30/2020  . Pneumococcal Conjugate-13 10/03/2016  . Pneumococcal  Polysaccharide-23 01/27/2011, 09/21/2017  . Tdap 01/27/2011  . Zoster 01/19/2010  . Zoster Recombinat (Shingrix) 06/25/2018, 09/06/2018   We updated and reviewed the patient's past history in detail and it is documented below. Allergies: Patient has No Known Allergies. Past Medical History  has a past medical history of Basal cell carcinoma, Cataract, Diabetes ([place]), Diabetic neuropathy ([place]), DJD (degenerative joint disease), Hypercholesteremia, Hyperlipidemia, Hypertension, Inflammatory bowel disease (10/26/2020), Neuropathy, Ruptured Achilles tendon due to trauma (10/2016), and Ulcerative proctitis ([place]). Past Surgical History Patient  has a past surgical history that includes colonscopy; Colonoscopy with propofol (N/A, 11/01/2016); Esophagogastroduodenoscopy (egd) with propofol (N/A, 11/01/2016); and Tonsillectomy. Social History Patient  reports that he has never smoked. He has never used smokeless tobacco. He reports that he does not drink alcohol and does not use drugs. Family History family history includes Brain cancer in his father; Diabetes in his father, maternal grandfather, maternal grandmother, paternal grandfather, and paternal grandmother; Healthy in his daughter and son; Pancreatic cancer in his mother; Stroke in his father. Review of Systems: Constitutional: negative for fever or malaise Ophthalmic: negative for photophobia, double vision or loss of vision Cardiovascular: negative for chest pain, dyspnea on exertion, or new LE swelling Respiratory: negative for SOB or persistent cough Gastrointestinal: negative for abdominal pain, change in bowel habits or melena Genitourinary: negative for dysuria or gross hematuria Musculoskeletal: negative for new gait disturbance or muscular weakness Integumentary: negative for new or persistent rashes Neurological: negative for TIA or stroke symptoms Psychiatric: negative for SI or delusions Allergic/Immunologic: negative for  hives  Patient Care Team    Relationship Specialty Notifications Start End  [name], MD PCP - General Family Medicine  09/21/17   Penni Bombard, MD Consulting Physician Neurology  09/21/17   Garlan Fair, MD Consulting Physician Gastroenterology  09/21/17   Irine Seal, MD Attending Physician Urology  02/23/18   Marchia Bond, MD Consulting Physician Orthopedic Surgery  09/05/18   Debbora Presto, NP Nurse Practitioner Neurology  10/07/19   Otelia Sergeant, OD Consulting Physician Optometry  10/07/19   Thornton Park, MD Consulting Physician Gastroenterology  10/26/20    Objective  Vitals: BP 116/68   Temp (!) 97.2 F (36.2 C) (Temporal)   Ht 6' (1.829 m)   Wt 181 lb 3.2 oz (82.2 kg)   BMI 24.58 kg/m  General:  Well developed, well nourished, no acute distress  Psych:  Alert and orientedx3,normal mood and affect HEENT:  Normocephalic, atraumatic, non-icteric sclera, PERRL, supple neck without adenopathy, mass or thyromegaly Cardiovascular:  Normal S1, S2, RRR without gallop, rub or murmur,+2 distal pulses in bilateral upper and lower extremities. Respiratory:  Good breath sounds bilaterally, CTAB with normal respiratory effort Gastrointestinal: normal bowel sounds, soft, non-tender, no noted masses. No HSM Skin:  Warm, no rashes or suspicious lesions noted Neurologic:    Mental status is normal. CN 2-11 are normal. Gross motor and sensory exams are normal. Stable gait. + tremor   Diabetic Foot Exam: Appearance - no lesions, ulcers or calluses Skin -  no sigificant pallor or erythema Monofilament testing - sensitive bilaterally in following locations:  Right - Great toe, medial, central, lateral ball and posterior foot intact  Left - Great toe, medial, central, lateral ball and posterior foot intact Pulses - +2 distally bilaterally  Assessment  1. Controlled type 2 diabetes mellitus with diabetic polyneuropathy, without long-term current use of insulin ([place])   2. Diabetic  polyneuropathy associated with type 2 diabetes mellitus ([place])   3. Spinal stenosis of lumbar region with neurogenic claudication   4. Restless leg syndrome   5. Mixed hyperlipidemia   6. Primary insomnia   7. Vitamin B12 deficiency   8. Essential hypertension   9. Inflammatory bowel disease   10. Tremor, essential   11. Muscle cramp, nocturnal      Plan  Male Wellness Visit:  Age appropriate Health Maintenance and Prevention measures were discussed with patient. Included topics are cancer screening recommendations, ways to keep healthy (see AVS) including dietary and exercise recommendations, regular eye and dental care, use of seat belts, and avoidance of moderate alcohol use and tobacco use.   BMI: discussed patient's BMI and encouraged positive lifestyle modifications to help get to or maintain a target BMI.  HM needs and immunizations were addressed and ordered. See below for orders. See HM and immunization section for updates.  Routine labs and screening tests ordered including cmp, cbc and lipids where appropriate.  Discussed recommendations regarding Vit D and calcium supplementation (see AVS)  Chronic disease f/u and/or acute problem visit: (deemed necessary to be done in addition to the wellness visit):  Diabetes with worsening control.  Patient will start exercising and monitoring diet again.  No medication changes today.  Recheck 3 months.  Education given.  Hypertension well-controlled on lisinopril.  Check renal function electrolytes.  Hyperlipidemia on low-dose statin.  Recommend increasing simvastatin to 40 mg nightly and recheck lipids.  Goal LDL less than 70.  May need stronger intensity statin.  Recheck B12 levels  Peripheral neuropathy is controlled on medication.  Tremor: He will follow-up with neurology to discuss medications.  He continues on restless leg medication.  Inflammatory bowel disease: Managed per GI.  Improved.  Follow up: 3 months for recheck  diabetes  Commons side effects, risks, benefits, and alternatives for medications and treatment plan prescribed today were discussed, and the patient expressed understanding of the given instructions. Patient is instructed to call or message via MyChart if he/she has any questions or concerns regarding our treatment plan. No barriers to understanding were identified. We discussed Red Flag symptoms and signs in detail. Patient expressed understanding regarding what to do in case of urgent or emergency type symptoms.   Medication list was reconciled, printed and provided to the patient in AVS. Patient instructions and summary information was reviewed with the patient as documented in the AVS. This note was prepared with assistance of Dragon voice recognition software. Occasional wrong-word or sound-a-like substitutions may have occurred due to the inherent limitations of voice recognition software  This visit occurred during the SARS-CoV-2 public health emergency.  Safety protocols were in place, including screening questions prior to the visit, additional usage of staff PPE, and extensive cleaning of exam room while observing appropriate contact time as indicated for disinfecting solutions.   Orders Placed This Encounter  Procedures  . CBC with Differential/Platelet  . Comprehensive metabolic panel  . Lipid panel  . Vitamin B12  . POCT glycosylated hemoglobin (Hb A1C)   No orders of the defined types  were placed in this encounter.

## 2020-11-17 ENCOUNTER — Telehealth: Payer: Self-pay | Admitting: Family Medicine

## 2020-11-17 DIAGNOSIS — L0889 Other specified local infections of the skin and subcutaneous tissue: Secondary | ICD-10-CM | POA: Diagnosis not present

## 2020-11-17 NOTE — Chronic Care Management (AMB) (Signed)
  Chronic Care Management   Note  11/17/2020 Name: Ismail Graziani MRN: [id number] DOB: 20-Sep-1945  [name] is a 75 y.o. year old male who is a primary care patient of [name], MD. I reached out to Coralyn Helling by phone today in response to a referral sent by [name] PCP, [name], MD.   [name] was given information about Chronic Care Management services today including:  1. CCM service includes personalized support from designated clinical staff supervised by his physician, including individualized plan of care and coordination with other care providers 2. 24/7 contact phone numbers for assistance for urgent and routine care needs. 3. Service will only be billed when office clinical staff spend 20 minutes or more in a month to coordinate care. 4. Only one practitioner may furnish and bill the service in a calendar month. 5. The patient may stop CCM services at any time (effective at the end of the month) by phone call to the office staff.   Patient agreed to services and verbal consent obtained.   Follow up plan:   Lauretta Grill [facility] Scheduler

## 2020-11-30 ENCOUNTER — Telehealth: Payer: Self-pay

## 2020-11-30 NOTE — Chronic Care Management (AMB) (Signed)
Chronic Care Management Pharmacy Assistant   Name: Shannon Hodge  MRN: [id number] DOB: 06-02-1946  Shannon Hodge is an 75 y.o. year old male who presents for his initial CCM visit with the clinical pharmacist.  Reason for Encounter: Chart Prep    Recent office visits:  10/26/20- Shannon Chang, MD-Seen for annual exam, chronic conditions addressed, encounter noted recommended  Increase simvastatin to 40 mg  Recent consult visits:  10/06/20- Shannon Park, MD (Gastro)- Colitis, decreased Lialda to 1-2 tabs prn per patient message , follow up prn  08/07/20-Chesson, Leilani Able, RN(Neurology  Patient message)- started amitriptyline 25 mg  07/03/20- Shannon Park, MD Shannon Hodge)- seen for rectal bleeding, increased lialda to 4.8 g daily, instructed to avoid nsaids, follow up 3 months  06/05/20- Shannon Park, MD Shannon Hodge)- seen for rectal bleeding, recommended daily stool bulking agent with psyllium or methylcellulose daily, colonoscopy scheduled, labs ordered, high fiber diet, no documented follow up   Hospital visits:  None in previous 6 months  Medications: Outpatient Encounter Medications as of 11/30/2020  Medication Sig  . acetaminophen (TYLENOL) 500 MG tablet Take 500 mg by mouth daily as needed for moderate pain or headache.  Marland Kitchen amitriptyline (ELAVIL) 25 MG tablet Take 1 tablet (25 mg total) by mouth at bedtime.  . cholecalciferol (VITAMIN D) 1000 units tablet Take 1,000 Units by mouth daily.  . Coenzyme Q10 (COQ10) 100 MG CAPS Take 1 capsule by mouth daily.  . Cyanocobalamin (VITAMIN B-12 PO) Take by mouth daily.  . fenofibrate micronized (LOFIBRA) 134 MG capsule Take 1 capsule (134 mg total) by mouth daily before breakfast.  . gabapentin (NEURONTIN) 600 MG tablet Take 1.5 tablets (900 mg total) by mouth 2 (two) times daily.  Marland Kitchen lisinopril (ZESTRIL) 20 MG tablet Take 1 tablet (20 mg total) by mouth daily.  . mesalamine (LIALDA) 1.2 g EC tablet Take 1-2 tablets by mouth prn for  abdominal pain and diarrhea. Pt requesting 90 tablets  . metFORMIN (GLUCOPHAGE-XR) 500 MG 24 hr tablet Take 4 tablets (2,000 mg total) by mouth daily.  . simvastatin (ZOCOR) 40 MG tablet TAKE 1 TABLET BY MOUTH EVERYDAY AT BEDTIME (Patient taking differently: TAKE 1/2 TABLET BY MOUTH EVERYDAY AT BEDTIME)  . valACYclovir (VALTREX) 500 MG tablet Take 500 mg by mouth daily as needed (outbreak).    No facility-administered encounter medications on file as of 11/30/2020.   Current Documented Medications Acetaminophen 500 mg Amitriptyline 25 mg- 90 DS last filled 10/22/20- Vitamin D 1000 U-  COQ10 100 mg  Vitamin B- 12  fenofibrate micronized 134 mg - 90DS last filled 08/31/20 Gabapentin 600 mg- 90 DS last filled 09/29/20 Lisinopril 20 mg- 90 DS last filled 07/14/20- Mesalamine 1.2 g- 90 DS last filled 10/09/20 Metformin 500 mg- 90 DS last filled 09/01/20 Simvastatin 40 mg- 90 DS last filled 06/04/20 Valacyclovir 500 mg- last filled 2016- pt reported not taking vdz   Have you seen any other providers since your last visit? Patient has not seen any other providers.  Any changes in your medications or health? Patient denies any changes  Any side effects from any medications? Patient denies any side effects.  Do you have an symptoms or problems not managed by your medications?yes,  Patient stated both Amitriptyline and Gabapentin are not helping his neuropathy.    Any concerns about your health right now?  Patient does not have concerns.  Has your provider asked that you check blood pressure, blood sugar, or follow special diet at home? Patient stated  he has an arm blood pressure cuff at home, but says he is not asked to monitor it.  Patient follows a special diet due to BS, he eats whole wheat, tofu snacks, hummus, veggies, no fried foods.  His diet consists of salt oriented foods as that is his preference rather than sugar.    Do you get any type of exercise on a regular basis?  Patient  does a lot of cardio, including walking outside and walking on treadmill.    Can you think of a goal you would like to reach for your health?  Patient did not express any goals.   Do you have any problems getting your medications? Patient reports no problems getting medications from [facility] or mail order.   Is there anything that you would like to discuss during the appointment? Patient wants to know what changes in diabetic medication can be made so that he is Hodge to continue cortisone shots for his hip pain.He states that with administration of cortisone shots his A1C increases and he gains weight which are adverse reactions.   Please bring medications and supplements to appointment Reminded patient of initial phone visit with CPP on 05/04 at 9 am   [place], [place]

## 2020-12-01 ENCOUNTER — Telehealth: Payer: PPO

## 2020-12-02 ENCOUNTER — Ambulatory Visit (INDEPENDENT_AMBULATORY_CARE_PROVIDER_SITE_OTHER): Payer: PPO

## 2020-12-02 DIAGNOSIS — I10 Essential (primary) hypertension: Secondary | ICD-10-CM

## 2020-12-02 DIAGNOSIS — E782 Mixed hyperlipidemia: Secondary | ICD-10-CM

## 2020-12-02 DIAGNOSIS — E1142 Type 2 diabetes mellitus with diabetic polyneuropathy: Secondary | ICD-10-CM | POA: Diagnosis not present

## 2020-12-02 NOTE — Progress Notes (Signed)
Chronic Care Management Pharmacy Note  12/02/2020 Name:  Shannon Hodge MRN:  [id number] DOB:  1945/10/09  Subjective: Shannon Hodge is an 75 y.o. year old male who is a primary patient of [name], MD.  The CCM team was consulted for assistance with disease management and care coordination needs.    Engaged with patient by telephone for initial visit in response to provider referral for pharmacy case management and/or care coordination services.   Consent to Services:  The patient was given the following information about Chronic Care Management services today, agreed to services, and gave verbal consent: 1. CCM service includes personalized support from designated clinical staff supervised by the primary care provider, including individualized plan of care and coordination with other care providers 2. 24/7 contact phone numbers for assistance for urgent and routine care needs. 3. Service will only be billed when office clinical staff spend 20 minutes or more in a month to coordinate care. 4. Only one practitioner may furnish and bill the service in a calendar month. 5.The patient may stop CCM services at any time (effective at the end of the month) by phone call to the office staff. 6. The patient will be responsible for cost sharing (co-pay) of up to 20% of the service fee (after annual deductible is met). Patient agreed to services and consent obtained.  Patient Care Team: [name], MD as PCP - General (Family Medicine) Penni Bombard, MD as Consulting Physician (Neurology) Garlan Fair, MD as Consulting Physician (Gastroenterology) Irine Seal, MD as Attending Physician (Urology) Marchia Bond, MD as Consulting Physician (Orthopedic Surgery) Debbora Presto, NP as Nurse Practitioner (Neurology) Otelia Sergeant, OD as Consulting Physician (Optometry) Thornton Park, MD as Consulting Physician (Gastroenterology) Madelin Rear, [facility] as Pharmacist (Pharmacist)  Recent  office visits:  10/26/20- Billey Chang, MD-Seen for annual exam, chronic conditions addressed, encounter noted recommended  Increase simvastatin to 40 mg  Recent consult visits:  10/06/20- Thornton Park, MD (Gastro)- Colitis, decreased Lialda to 1-2 tabs prn per patient message , follow up prn  08/07/20-Chesson, Leilani Able, RN(Neurology  Patient message)- started amitriptyline 25 mg  07/03/20- Thornton Park, MD Gertie Fey)- seen for rectal bleeding, increased lialda to 4.8 g daily, instructed to avoid nsaids, follow up 3 months  06/05/20- Thornton Park, MD Gertie Fey)- seen for rectal bleeding, recommended daily stool bulking agent with psyllium or methylcellulose daily, colonoscopy scheduled, labs ordered, high fiber diet, no documented follow up   Hospital visits:  None in previous 6 months  Objective:  Lab Results  Component Value Date   CREATININE 1.21 10/26/2020   CREATININE 1.17 10/07/2019   CREATININE 1.15 01/03/2019    Lab Results  Component Value Date   HGBA1C 7.9 (A) 10/26/2020   Last diabetic Eye exam:  Lab Results  Component Value Date/Time   HMDIABEYEEXA No Retinopathy 02/07/2018 12:00 AM    Last diabetic Foot exam: No results found for: HMDIABFOOTEX      Component Value Date/Time   CHOL 152 10/26/2020 0831   TRIG 122.0 10/26/2020 0831   HDL 51.30 10/26/2020 0831   CHOLHDL 3 10/26/2020 0831   VLDL 24.4 10/26/2020 0831   LDLCALC 76 10/26/2020 0831    Hepatic Function Latest Ref Rng & Units 10/26/2020 10/07/2019 01/03/2019  Total Protein 6.0 - 8.3 g/dL 6.7 6.8 6.7  Albumin 3.5 - 5.2 g/dL 4.8 4.5 4.6  AST 0 - 37 U/L _0 ALT 0 - 53 U/L _1 Alk  Phosphatase 39 - 117 U/L 49 42 42  Total Bilirubin 0.2 - 1.2 mg/dL 0.8 0.9 0.8    Lab Results  Component Value Date/Time   TSH 1.16 10/07/2019 08:53 AM   TSH 1.42 03/27/2018 08:33 AM    CBC Latest Ref Rng & Units 10/26/2020 04/14/2020 10/07/2019  WBC 4.0 - 10.5 K/uL 4.7 4.5 4.6  Hemoglobin 13.0 - 17.0 g/dL  12.7(L) 12.8(L) 13.5  Hematocrit 39.0 - 52.0 % 37.5(L) 38.2(L) 38.5(L)  Platelets 150.0 - 400.0 K/uL 193.0 177 187.0    No results found for: VD25OH  Clinical ASCVD:  The 10-year ASCVD risk score Mikey Bussing DC Jr., et al., 2013) is: 31.7%   Values used to calculate the score:     Age: 76 years     Sex: Male     Is Non-Hispanic African American: No     Diabetic: Yes     Tobacco smoker: No     Systolic Blood Pressure: 650 mmHg     Is BP treated: Yes     HDL Cholesterol: 51.3 mg/dL     Total Cholesterol: 152 mg/dL    Social History   Tobacco Use  Smoking Status Never Smoker  Smokeless Tobacco Never Used   BP Readings from Last 3 Encounters:  10/26/20 116/68  07/03/20 120/64  06/19/20 (!) 108/50   Pulse Readings from Last 3 Encounters:  07/03/20 68  06/19/20 66  06/05/20 74   Wt Readings from Last 3 Encounters:  10/26/20 181 lb 3.2 oz (82.2 kg)  10/06/20 175 lb (79.4 kg)  07/03/20 173 lb 9.6 oz (78.7 kg)    Assessment: Review of patient past medical history, allergies, medications, health status, including review of consultants reports, laboratory and other test data, was performed as part of comprehensive evaluation and provision of chronic care management services.   SDOH:  (Social Determinants of Health) assessments and interventions performed: Yes  CCM Care Plan  No Known Allergies  Medications Reviewed Today    Reviewed by Madelin Rear, [facility] (Pharmacist) on 12/02/20 at [id number]  Med List Status: <None>  Medication Order Taking? Sig Documenting Provider Last Dose Status Informant  acetaminophen (TYLENOL) 500 MG tablet [id number]  Take 500 mg by mouth daily as needed for moderate pain or headache. [provider]  Active Self  amitriptyline (ELAVIL) 25 MG tablet [id number] Yes Take 1 tablet (25 mg total) by mouth at bedtime. Penumalli, Earlean Polka, MD Taking Active   cholecalciferol (VITAMIN D) 1000 units tablet [id number] Yes Take 1,000 Units by mouth daily. [provider] Taking Active   Coenzyme Q10 (COQ10) 100 MG CAPS [id number]  Take 1 capsule by mouth daily. [provider]  Active   Cyanocobalamin (VITAMIN B-12 PO) [id number]  Take by mouth daily. [provider]  Active   fenofibrate micronized (LOFIBRA) 134 MG capsule [id number] Yes Take 1 capsule (134 mg total) by mouth daily before breakfast. [name], MD Taking Active   gabapentin (NEURONTIN) 600 MG tablet [id number] Yes Take 1.5 tablets (900 mg total) by mouth 2 (two) times daily. Penumalli, Earlean Polka, MD Taking Active   lisinopril (ZESTRIL) 20 MG tablet [id number] Yes Take 1 tablet (20 mg total) by mouth daily. [name], MD Taking Active   Magnesium Oxide 500 MG TABS [id number] Yes Take by mouth. [provider] Taking Active   mesalamine (LIALDA) 1.2 g EC tablet [id number] Yes Take 1-2 tablets by mouth prn for abdominal pain and diarrhea. Pt requesting 90 tablets Thornton Park,  MD Taking Active   metFORMIN (GLUCOPHAGE-XR) 500 MG 24 hr tablet [id number] Yes Take 4 tablets (2,000 mg total) by mouth daily. [name], MD Taking Active   simvastatin (ZOCOR) 40 MG tablet [id number] Yes TAKE 1 TABLET BY MOUTH EVERYDAY AT BEDTIME  Patient taking differently: 40 mg. TAKE 1 TABLET BY MOUTH EVERYDAY AT BEDTIME   [name], MD Taking Active   valACYclovir (VALTREX) 500 MG tablet [id number]  Take 500 mg by mouth daily as needed (outbreak).  [provider]  Active Self           Med Note Darra Lis Oct 27, 2016 10:58 AM)            Patient Active Problem List   Diagnosis Date Noted  . Inflammatory bowel disease 10/26/2020  . Essential hypertension 04/01/2020  . Achilles rupture, right 04/01/2020  . Spinal stenosis of lumbar region 04/01/2020  . Diabetic polyneuropathy associated with type 2 diabetes mellitus ([place]) 01/01/2019  . Restless leg syndrome 09/21/2017  . Mixed hyperlipidemia 09/21/2017  . Hearing loss 09/21/2017  .  Muscle cramp, nocturnal 09/21/2017  . Vitamin B12 deficiency 09/21/2017  . Foot drop, left 09/21/2017  . Controlled type 2 diabetes mellitus with diabetic polyneuropathy, without long-term current use of insulin ([place]) 09/01/2016  . Allergic rhinitis 02/22/2016  . Genital herpes 02/22/2016  . Insomnia 02/22/2016  . 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  . Lumbar radiculopathy 11/04/2014    Immunization History  Administered Date(s) Administered  . Fluad Quad(high Dose 65+) 03/30/2020  . Influenza, High Dose Seasonal PF 05/09/2016, 05/04/2017, 04/12/2018, 03/30/2020  . Influenza,inj,Quad PF,6+ Mos 04/05/2019  . Influenza-Unspecified 05/01/2015  . PFIZER Comirnaty(Gray Top)Covid-19 Tri-Sucrose Vaccine 10/28/2020  . PFIZER(Purple Top)SARS-COV-2 Vaccination 08/22/2019, 09/12/2019, 03/30/2020  . Pneumococcal Conjugate-13 10/03/2016  . Pneumococcal Polysaccharide-23 01/27/2011, 09/21/2017  . Tdap 01/27/2011  . Zoster 01/19/2010  . Zoster Recombinat (Shingrix) 06/25/2018, 09/06/2018   Conditions to be addressed/monitored: DMII, Insomnia, inflammatory bowel disease, b12 deficiency, HLD, RLS, low testosterone, HTN  Care Plan : CCM Pharmacy Care Plan  Updates made by Madelin Rear, [facility] since 12/02/2020 12:00 AM    Problem: DMII, Insomnia, inflammatory bowel disease, b12 deficiency, HLD, RLS, low testosterone, HTN   Priority: High    Long-Range Goal: Disease Management   Start Date: 12/02/2020  Expected End Date: 12/02/2021  This Visit's Progress: On track  Priority: High  Note:   rent Barriers:  . HLD control, DM control  Pharmacist Clinical Goal(s):  Marland Kitchen Patient will contact provider office for questions/concerns as evidenced notation of same in electronic health record through collaboration with PharmD and provider.   Interventions: . 1:1 collaboration with [name], MD regarding development and update of comprehensive plan of care as evidenced by provider  attestation and co-signature . Inter-disciplinary care team collaboration (see longitudinal plan of care) . Comprehensive medication review performed; medication list updated in electronic medical record . No medication changes  Hypertension (BP goal <130/80) -Controlled. Consistently at goal during OVs -Current treatment: . Lisinopril 20 mg once daily  -Denies hypotensive/hypertensive symptoms -Counseled to monitor BP at home as directed, document, and provide log at future appointments -Recommended to continue current medication  Hyperlipidemia: (LDL goal < 70) -Not ideally controlled  -10 yr ASCVD 36% -Simvastatin increased to 40 mg following 09/2020 OV -Current treatment: . Simvastatin 40 mg once daily . Fenofibrate 134 mg once daily  -Educated on Cholesterol goals;  -Recommended to continue current medication  Diabetes (A1c goal <7%) -Not ideally controlled -last POC a1c 7.9% 10/26/2020 up from previous 6.1%. has been consistently at goal previously. -now receiving steroid injections, plans to hold off on 2nd injection for as long as possible. Last injection 08/2020. -Current medications: Marland Kitchen Metformin XR 500 mg tab - 4 tabs (2000 mg) once daily with dinner -Medications previously tried: no other medications per med hx.   -Current home glucose readings -Denies hypoglycemic/hyperglycemic symptoms -Current meal patterns: Patient follows a special diet due to BS, he eats whole wheat, tofu snacks, hummus, veggies, no fried foods.  His diet consists of salt oriented foods as that is his preference rather than sugar.   -Current exercise: Patient does a lot of cardio, including walking outside and walking on treadmill.   -Educated on A1c and blood sugar goals; -Counseled to check feet daily and get yearly eye exams -Recommended to continue current medication, next a1c 01/26/2021. We also discussed possible future addition of Januvia if needed based on lab.  Insomnia (Goal: ensure sleep  hygiene) -Has somewhat improved with recent medications for pain. Is content with amount of sleep, feels rested enough throughout the day -Current treatment  . n/a -Medications previously tried: zolpidem ~16m once daily, no OTCs -Counseled on sleep hygiene, could consider otc melatonin sleep aid  Patient Goals/Self-Care Activities . Patient will:  - target a minimum of 150 minutes of moderate intensity exercise weekly  Follow Up Plan: RFairmountf/u 2 months  Medication Assistance: None required.  Patient affirms current coverage meets needs.  Patient's preferred pharmacy is:  [id number] [place], [place] - [address] [place]  [id number]: [phone]: [phone] GroundOSt Mary'S [facility]- NoPow[place]one: [phone]: [phone] Up:  Patient agrees to Care Plan and Follow-up.    Future Appointments  Date Time Provider [place]6/28/2022  8:30 AM AnLeamon ArntMD [facility]7/06/2021 10:30 AM [facility] CCM PHARMACIST [facility]  04/12/2021  9:30 AM Lomax, Amy, NP [facility] None   JaMadelin RearPharm.D., BCGP Clinical Pharmacist [phone]

## 2020-12-02 NOTE — Patient Instructions (Signed)
[name],  Thank you for talking with me today. I have included our care plan/goals in the following pages.   Please review and call me at [phone] with any questions.  Thanks! Ellin Mayhew, Pharm.D., BCGP Clinical Pharmacist [facility] [place] [phone] Patient Care Plan: [place] Plan    Problem Identified: DMII, Insomnia, inflammatory bowel disease, b12 deficiency, HLD, RLS, low testosterone, HTN   Priority: High    Long-Range Goal: Disease Management   Start Date: 12/02/2020  Expected End Date: 12/02/2021  This Visit's Progress: On track  Priority: High  Note:   rent Barriers:  . HLD control, DM control  Pharmacist Clinical Goal(s):  Marland Kitchen Patient will contact provider office for questions/concerns as evidenced notation of same in electronic health record through collaboration with PharmD and provider.   Interventions: . 1:1 collaboration with [name], MD regarding development and update of comprehensive plan of care as evidenced by provider attestation and co-signature . Inter-disciplinary care team collaboration (see longitudinal plan of care) . Comprehensive medication review performed; medication list updated in electronic medical record  Hypertension (BP goal <130/80) -Controlled. Consistently at goal during OVs -Current treatment: . Lisinopril 20 mg once daily  -Denies hypotensive/hypertensive symptoms -Counseled to monitor BP at home as directed, document, and provide log at future appointments -Recommended to continue current medication  Hyperlipidemia: (LDL goal < 70) -Not ideally controlled  -10 yr ASCVD 36% -Simvastatin increased to 40 mg following 09/2020 OV -Current treatment: . Simvastatin 40 mg once daily . Fenofibrate 134 mg once daily  -Educated on Cholesterol goals;  -Recommended to continue current medication  Diabetes (A1c goal <7%) -Not ideally controlled -last POC a1c  7.9% 10/26/2020 up from previous 6.1%. has been consistently at goal previously. -now receiving steroid injections, plans to hold off on 2nd injection for as long as possible. Last injection 08/2020. -Current medications: Marland Kitchen Metformin XR 500 mg tab - 4 tabs (2000 mg) once daily with dinner -Medications previously tried: no other medications per med hx.   -Current home glucose readings -Denies hypoglycemic/hyperglycemic symptoms -Current meal patterns: Patient follows a special diet due to BS, he eats whole wheat, tofu snacks, hummus, veggies, no fried foods.  His diet consists of salt oriented foods as that is his preference rather than sugar.   -Current exercise: Patient does a lot of cardio, including walking outside and walking on treadmill.   -Educated on A1c and blood sugar goals; -Counseled to check feet daily and get yearly eye exams -Recommended to continue current medication, next a1c 01/26/2021. We also discussed possible future addition of Januvia if needed based on lab.  Insomnia (Goal: ensure sleep hygiene) -Has somewhat improved with recent medications for pain. Is content with amount of sleep, feels rested enough throughout the day -Current treatment  . n/a -Medications previously tried: zolpidem ~29m once daily, no OTCs -Counseled on sleep hygiene, could consider otc melatonin sleep aid  Patient Goals/Self-Care Activities . Patient will:  - target a minimum of 150 minutes of moderate intensity exercise weekly  Follow Up Plan: [place]  Medication Assistance: None required.  Patient affirms current coverage meets needs.  Patient's preferred pharmacy is:  [id number] [place], [place] - [address] [place] [phone]: [phone]: [phone]  [place] [address] Phone:  [phone] Fax: [phone]  Follow Up:  Patient agrees to Care Plan and Follow-up.      The patient was given the following information about Chronic Care Management services today, agreed to services, and gave verbal consent: 1. CCM service includes personalized support from designated clinical staff supervised by the primary care provider, including individualized plan of care and coordination with other care providers 2. 24/7 contact phone numbers for assistance for urgent and routine care needs. 3. Service will only be billed when office clinical staff spend 20 minutes or more in a month to coordinate care. 4. Only one practitioner may furnish and bill the service in a calendar month. 5.The patient may stop CCM services at any time (effective at the end of the month) by phone call to the office staff. 6. The patient will be responsible for cost sharing (co-pay) of up to 20% of the service fee (after annual deductible is met). Patient agreed to services and consent obtained.  The patient verbalized understanding of instructions provided today and agreed to receive a MyChart copy of patient instruction and/or educational materials. Telephone follow up appointment with pharmacy team member scheduled for: See next appointment with "Care Management Staff" under "What's Next" below.    Diabetes Mellitus and Nutrition, Adult When you have diabetes, or diabetes mellitus, it is very important to have healthy eating habits because your blood sugar (glucose) levels are greatly affected by what you eat and drink. Eating healthy foods in the right amounts, at about the same times every day, can help you:  Control your blood glucose.  Lower your risk of heart disease.  Improve your blood pressure.  Reach or maintain a healthy weight. What can affect my meal plan? Every person with diabetes is different, and each person has different needs for a meal plan. Your health care provider may recommend that  you work with a dietitian to make a meal plan that is best for you. Your meal plan may vary depending on factors such as:  The calories you need.  The medicines you take.  Your weight.  Your blood glucose, blood pressure, and cholesterol levels.  Your activity level.  Other health conditions you have, such as heart or kidney disease. How do carbohydrates affect me? Carbohydrates, also called carbs, affect your blood glucose level more than any other type of food. Eating carbs naturally raises the amount of glucose in your blood. Carb counting is a method for keeping track of how many carbs you eat. Counting carbs is important to keep your blood glucose at a healthy level, especially if you use insulin or take certain oral diabetes medicines. It is important to know how many carbs you can safely have in each meal. This is different for every person. Your dietitian can help you calculate how many carbs you should have at each meal and for each snack. How does alcohol affect me? Alcohol can cause a sudden decrease in blood glucose (hypoglycemia), especially if you use insulin or take certain oral diabetes medicines. Hypoglycemia can be a life-threatening condition. Symptoms of hypoglycemia, such as sleepiness, dizziness, and confusion, are similar to symptoms of having too much alcohol.  Do not drink alcohol if: ? Your health care provider tells you not to drink. ? You are pregnant, may be pregnant, or are planning to become pregnant.  If you drink alcohol: ? Do not drink on an empty stomach. ? Limit how much you  use to:  0-1 drink a day for women.  0-2 drinks a day for men. ? Be aware of how much alcohol is in your drink. In the [place] one drink equals one 12 oz bottle of beer (355 mL), one 5 oz glass of wine (148 mL), or one 1 oz glass of hard liquor (44 mL). ? Keep yourself hydrated with water, diet soda, or unsweetened iced tea.  Keep in mind that regular soda, juice, and other  mixers may contain a lot of sugar and must be counted as carbs. What are tips for following this plan? Reading food labels  Start by checking the serving size on the "Nutrition Facts" label of packaged foods and drinks. The amount of calories, carbs, fats, and other nutrients listed on the label is based on one serving of the item. Many items contain more than one serving per package.  Check the total grams (g) of carbs in one serving. You can calculate the number of servings of carbs in one serving by dividing the total carbs by 15. For example, if a food has 30 g of total carbs per serving, it would be equal to 2 servings of carbs.  Check the number of grams (g) of saturated fats and trans fats in one serving. Choose foods that have a low amount or none of these fats.  Check the number of milligrams (mg) of salt (sodium) in one serving. Most people should limit total sodium intake to less than 2,300 mg per day.  Always check the nutrition information of foods labeled as "low-fat" or "nonfat." These foods may be higher in added sugar or refined carbs and should be avoided.  Talk to your dietitian to identify your daily goals for nutrients listed on the label. Shopping  Avoid buying canned, pre-made, or processed foods. These foods tend to be high in fat, sodium, and added sugar.  Shop around the outside edge of the grocery store. This is where you will most often find fresh fruits and vegetables, bulk grains, fresh meats, and fresh dairy. Cooking  Use low-heat cooking methods, such as baking, instead of high-heat cooking methods like deep frying.  Cook using healthy oils, such as olive, canola, or sunflower oil.  Avoid cooking with butter, cream, or high-fat meats. Meal planning  Eat meals and snacks regularly, preferably at the same times every day. Avoid going long periods of time without eating.  Eat foods that are high in fiber, such as fresh fruits, vegetables, beans, and whole  grains. Talk with your dietitian about how many servings of carbs you can eat at each meal.  Eat 4-6 oz (112-168 g) of lean protein each day, such as lean meat, chicken, fish, eggs, or tofu. One ounce (oz) of lean protein is equal to: ? 1 oz (28 g) of meat, chicken, or fish. ? 1 egg. ?  cup (62 g) of tofu.  Eat some foods each day that contain healthy fats, such as avocado, nuts, seeds, and fish.   What foods should I eat? Fruits Berries. Apples. Oranges. Peaches. Apricots. Plums. Grapes. Mango. Papaya. Pomegranate. Kiwi. Cherries. Vegetables Lettuce. Spinach. Leafy greens, including kale, chard, collard greens, and mustard greens. Beets. Cauliflower. Cabbage. Broccoli. Carrots. Green beans. Tomatoes. Peppers. Onions. Cucumbers. Brussels sprouts. Grains Whole grains, such as whole-wheat or whole-grain bread, crackers, tortillas, cereal, and pasta. Unsweetened oatmeal. Quinoa. Brown or wild rice. Meats and other proteins Seafood. Poultry without skin. Lean cuts of poultry and beef. Tofu. Nuts. Seeds. Dairy Low-fat  or fat-free dairy products such as milk, yogurt, and cheese. The items listed above may not be a complete list of foods and beverages you can eat. Contact a dietitian for more information. What foods should I avoid? Fruits Fruits canned with syrup. Vegetables Canned vegetables. Frozen vegetables with butter or cream sauce. Grains Refined white flour and flour products such as bread, pasta, snack foods, and cereals. Avoid all processed foods. Meats and other proteins Fatty cuts of meat. Poultry with skin. Breaded or fried meats. Processed meat. Avoid saturated fats. Dairy Full-fat yogurt, cheese, or milk. Beverages Sweetened drinks, such as soda or iced tea. The items listed above may not be a complete list of foods and beverages you should avoid. Contact a dietitian for more information. Questions to ask a health care provider  Do I need to meet with a diabetes  educator?  Do I need to meet with a dietitian?  What number can I call if I have questions?  When are the best times to check my blood glucose? Where to find more information:  American Diabetes Association: diabetes.org  Academy of Nutrition and Dietetics: www.eatright.[employer] of Diabetes and Digestive and Kidney Diseases: [employer]  Association of Diabetes Care and Education Specialists: www.diabeteseducator.org Summary  It is important to have healthy eating habits because your blood sugar (glucose) levels are greatly affected by what you eat and drink.  A healthy meal plan will help you control your blood glucose and maintain a healthy lifestyle.  Your health care provider may recommend that you work with a dietitian to make a meal plan that is best for you.  Keep in mind that carbohydrates (carbs) and alcohol have immediate effects on your blood glucose levels. It is important to count carbs and to use alcohol carefully. This information is not intended to replace advice given to you by your health care provider. Make sure you discuss any questions you have with your health care provider. Document Revised: 06/25/2019 Document Reviewed: 06/25/2019 Elsevier Patient Education  2021 [employer].

## 2021-01-08 ENCOUNTER — Other Ambulatory Visit: Payer: Self-pay

## 2021-01-08 ENCOUNTER — Ambulatory Visit (INDEPENDENT_AMBULATORY_CARE_PROVIDER_SITE_OTHER): Payer: PPO

## 2021-01-08 DIAGNOSIS — Z0000 Encounter for general adult medical examination without abnormal findings: Secondary | ICD-10-CM

## 2021-01-08 NOTE — Patient Instructions (Signed)
Shannon Hodge , Thank you for taking time to come for your Medicare Wellness Visit. I appreciate your ongoing commitment to your health goals. Please review the following plan we discussed and let me know if I can assist you in the future.   Screening recommendations/referrals: Colonoscopy: Done 06/19/20 Recommended yearly ophthalmology/optometry visit for glaucoma screening and checkup Recommended yearly dental visit for hygiene and checkup  Vaccinations: Influenza vaccine: Done 03/30/20 due 03/01/21 Pneumococcal vaccine: Up to date completed  Tdap vaccine: Due 01/26/21 Shingles vaccine: Completed 06/25/18 & 09/06/18   Covid-19: Completed 1/21, 2/1, 03/30/20 & 10/28/20  Advanced directives: Please bring a copy of your health care power of attorney and living will to the office at your convenience.  Conditions/risks identified: regulate A1C   Next appointment: Follow up in one year for your annual wellness visit.   Preventive Care 73 Years and Older, Male Preventive care refers to lifestyle choices and visits with your health care provider that can promote health and wellness. What does preventive care include? A yearly physical exam. This is also called an annual well check. Dental exams once or twice a year. Routine eye exams. Ask your health care provider how often you should have your eyes checked. Personal lifestyle choices, including: Daily care of your teeth and gums. Regular physical activity. Eating a healthy diet. Avoiding tobacco and drug use. Limiting alcohol use. Practicing safe sex. Taking low doses of aspirin every day. Taking vitamin and mineral supplements as recommended by your health care provider. What happens during an annual well check? The services and screenings done by your health care provider during your annual well check will depend on your age, overall health, lifestyle risk factors, and family history of disease. Counseling  Your health care provider may ask  you questions about your: Alcohol use. Tobacco use. Drug use. Emotional well-being. Home and relationship well-being. Sexual activity. Eating habits. History of falls. Memory and ability to understand (cognition). Work and work Statistician. Screening  You may have the following tests or measurements: Height, weight, and BMI. Blood pressure. Lipid and cholesterol levels. These may be checked every 5 years, or more frequently if you are over 73 years old. Skin check. Lung cancer screening. You may have this screening every year starting at age 73 if you have a 30-pack-year history of smoking and currently smoke or have quit within the past 15 years. Fecal occult blood test (FOBT) of the stool. You may have this test every year starting at age 73. Flexible sigmoidoscopy or colonoscopy. You may have a sigmoidoscopy every 5 years or a colonoscopy every 10 years starting at age 73. Prostate cancer screening. Recommendations will vary depending on your family history and other risks. Hepatitis C blood test. Hepatitis B blood test. Sexually transmitted disease (STD) testing. Diabetes screening. This is done by checking your blood sugar (glucose) after you have not eaten for a while (fasting). You may have this done every 1-3 years. Abdominal aortic aneurysm (AAA) screening. You may need this if you are a current or former smoker. Osteoporosis. You may be screened starting at age 73 if you are at high risk. Talk with your health care provider about your test results, treatment options, and if necessary, the need for more tests. Vaccines  Your health care provider may recommend certain vaccines, such as: Influenza vaccine. This is recommended every year. Tetanus, diphtheria, and acellular pertussis (Tdap, Td) vaccine. You may need a Td booster every 10 years. Zoster vaccine. You may need this  after age 73. Pneumococcal 13-valent conjugate (PCV13) vaccine. One dose is recommended after age  73. Pneumococcal polysaccharide (PPSV23) vaccine. One dose is recommended after age 73. Talk to your health care provider about which screenings and vaccines you need and how often you need them. This information is not intended to replace advice given to you by your health care provider. Make sure you discuss any questions you have with your health care provider. Document Released: 08/14/2015 Document Revised: 04/06/2016 Document Reviewed: 05/19/2015 Elsevier Interactive Patient Education  2017 [place] Prevention in the Home Falls can cause injuries. They can happen to people of all ages. There are many things you can do to make your home safe and to help prevent falls. What can I do on the outside of my home? Regularly fix the edges of walkways and driveways and fix any cracks. Remove anything that might make you trip as you walk through a door, such as a raised step or threshold. Trim any bushes or trees on the path to your home. Use bright outdoor lighting. Clear any walking paths of anything that might make someone trip, such as rocks or tools. Regularly check to see if handrails are loose or broken. Make sure that both sides of any steps have handrails. Any raised decks and porches should have guardrails on the edges. Have any leaves, snow, or ice cleared regularly. Use sand or salt on walking paths during winter. Clean up any spills in your garage right away. This includes oil or grease spills. What can I do in the bathroom? Use night lights. Install grab bars by the toilet and in the tub and shower. Do not use towel bars as grab bars. Use non-skid mats or decals in the tub or shower. If you need to sit down in the shower, use a plastic, non-slip stool. Keep the floor dry. Clean up any water that spills on the floor as soon as it happens. Remove soap buildup in the tub or shower regularly. Attach bath mats securely with double-sided non-slip rug tape. Do not have throw  rugs and other things on the floor that can make you trip. What can I do in the bedroom? Use night lights. Make sure that you have a light by your bed that is easy to reach. Do not use any sheets or blankets that are too big for your bed. They should not hang down onto the floor. Have a firm chair that has side arms. You can use this for support while you get dressed. Do not have throw rugs and other things on the floor that can make you trip. What can I do in the kitchen? Clean up any spills right away. Avoid walking on wet floors. Keep items that you use a lot in easy-to-reach places. If you need to reach something above you, use a strong step stool that has a grab bar. Keep electrical cords out of the way. Do not use floor polish or wax that makes floors slippery. If you must use wax, use non-skid floor wax. Do not have throw rugs and other things on the floor that can make you trip. What can I do with my stairs? Do not leave any items on the stairs. Make sure that there are handrails on both sides of the stairs and use them. Fix handrails that are broken or loose. Make sure that handrails are as long as the stairways. Check any carpeting to make sure that it is firmly attached to the  stairs. Fix any carpet that is loose or worn. Avoid having throw rugs at the top or bottom of the stairs. If you do have throw rugs, attach them to the floor with carpet tape. Make sure that you have a light switch at the top of the stairs and the bottom of the stairs. If you do not have them, ask someone to add them for you. What else can I do to help prevent falls? Wear shoes that: Do not have high heels. Have rubber bottoms. Are comfortable and fit you well. Are closed at the toe. Do not wear sandals. If you use a stepladder: Make sure that it is fully opened. Do not climb a closed stepladder. Make sure that both sides of the stepladder are locked into place. Ask someone to hold it for you, if  possible. Clearly mark and make sure that you can see: Any grab bars or handrails. First and last steps. Where the edge of each step is. Use tools that help you move around (mobility aids) if they are needed. These include: Canes. Walkers. Scooters. Crutches. Turn on the lights when you go into a dark area. Replace any light bulbs as soon as they burn out. Set up your furniture so you have a clear path. Avoid moving your furniture around. If any of your floors are uneven, fix them. If there are any pets around you, be aware of where they are. Review your medicines with your doctor. Some medicines can make you feel dizzy. This can increase your chance of falling. Ask your doctor what other things that you can do to help prevent falls. This information is not intended to replace advice given to you by your health care provider. Make sure you discuss any questions you have with your health care provider. Document Released: 05/14/2009 Document Revised: 12/24/2015 Document Reviewed: 08/22/2014 Elsevier Interactive Patient Education  2017 [employer].

## 2021-01-08 NOTE — Progress Notes (Signed)
Virtual Visit via Telephone Note  I connected with  Coralyn Helling on 01/08/21 at  9:30 AM EDT by telephone and verified that I am speaking with the correct person using two identifiers.  Medicare Annual Wellness visit completed telephonically due to Covid-19 pandemic.   Persons participating in this call: This Health Coach and this patient.   Location: Patient: Home Provider: Office   I discussed the limitations, risks, security and privacy concerns of performing an evaluation and management service by telephone and the availability of in person appointments. The patient expressed understanding and agreed to proceed.  Unable to perform video visit due to video visit attempted and failed and/or patient does not have video capability.   Some vital signs may be absent or patient reported.   Willette Brace, LPN   Subjective:   Vishnu Moeller is a 75 y.o. male who presents for Medicare Annual/Subsequent preventive examination.  Review of Systems     Cardiac Risk Factors include: advanced age (>41men, >31 women);hypertension;dyslipidemia;diabetes mellitus;male gender     Objective:    There were no vitals filed for this visit. There is no height or weight on file to calculate BMI.  Advanced Directives 01/08/2021 10/07/2019 09/05/2018 11/01/2016 10/27/2016 11/23/2015 05/20/2015  Does Patient Have a Medical Advance Directive? Yes Yes Yes Yes Yes Yes Yes  Type of Advance Directive Living will Living will;Healthcare Power of [place];Living will [place];Living will [place];Living will [place];Living will [place];Living will  Does patient want to make changes to medical advance directive? - No - Patient declined - - No - Patient declined - -  Copy of [place] in Chart? - No - copy requested No - copy requested No - copy requested - - No - copy requested  Would patient  like information on creating a medical advance directive? - - - - - - -    Current Medications (verified) Outpatient Encounter Medications as of 01/08/2021  Medication Sig   acetaminophen (TYLENOL) 500 MG tablet Take 500 mg by mouth daily as needed for moderate pain or headache.   amitriptyline (ELAVIL) 25 MG tablet Take 1 tablet (25 mg total) by mouth at bedtime.   cholecalciferol (VITAMIN D) 1000 units tablet Take 1,000 Units by mouth daily.   Coenzyme Q10 (COQ10) 100 MG CAPS Take 1 capsule by mouth daily.   Cyanocobalamin (VITAMIN B-12 PO) Take by mouth daily.   fenofibrate micronized (LOFIBRA) 134 MG capsule Take 1 capsule (134 mg total) by mouth daily before breakfast.   gabapentin (NEURONTIN) 600 MG tablet Take 1.5 tablets (900 mg total) by mouth 2 (two) times daily.   lisinopril (ZESTRIL) 20 MG tablet Take 1 tablet (20 mg total) by mouth daily.   Magnesium Oxide 500 MG TABS Take by mouth.   mesalamine (LIALDA) 1.2 g EC tablet Take 1-2 tablets by mouth prn for abdominal pain and diarrhea. Pt requesting 90 tablets   metFORMIN (GLUCOPHAGE-XR) 500 MG 24 hr tablet Take 4 tablets (2,000 mg total) by mouth daily.   simvastatin (ZOCOR) 40 MG tablet TAKE 1 TABLET BY MOUTH EVERYDAY AT BEDTIME (Patient taking differently: 40 mg. TAKE 1 TABLET BY MOUTH EVERYDAY AT BEDTIME)   valACYclovir (VALTREX) 500 MG tablet Take 500 mg by mouth daily as needed (outbreak).    No facility-administered encounter medications on file as of 01/08/2021.    Allergies (verified) Patient has no known allergies.   History: Past  Medical History:  Diagnosis Date   Basal cell carcinoma    upper back   Cataract    right eye   Diabetes ([place])    type 2   Diabetic neuropathy (HCC)    both legs   DJD (degenerative joint disease)    L 5   Hypercholesteremia    Hyperlipidemia    Hypertension    Inflammatory bowel disease 10/26/2020   Unspecified. Seeing [facility]   Neuropathy    Ruptured Achilles tendon due to  trauma 10/2016   Ulcerative proctitis [facility])    Past Surgical History:  Procedure Laterality Date   basil cell     basil cell/mole removed from back   COLONOSCOPY WITH PROPOFOL N/A 11/01/2016   Procedure: COLONOSCOPY WITH PROPOFOL;  Surgeon: Garlan Fair, MD;  Location: [facility];  Service: Endoscopy;  Laterality: N/A;   colonscopy     x 2   ESOPHAGOGASTRODUODENOSCOPY (EGD) WITH PROPOFOL N/A 11/01/2016   Procedure: ESOPHAGOGASTRODUODENOSCOPY (EGD) WITH PROPOFOL;  Surgeon: Garlan Fair, MD;  Location: [facility];  Service: Endoscopy;  Laterality: N/A;   TONSILLECTOMY     Family History  Problem Relation Age of Onset   Pancreatic cancer Mother    Stroke Father    Brain cancer Father    Diabetes Father    Healthy Daughter    Healthy Son    Diabetes Maternal Grandmother    Diabetes Maternal Grandfather    Diabetes Paternal Grandmother    Diabetes Paternal Grandfather    Colon cancer Neg Hx    Colon polyps Neg Hx    Esophageal cancer Neg Hx    Stomach cancer Neg Hx    Social History   Socioeconomic History   Marital status: Married    Spouse name: Diane    Number of children: 3   Years of education: post grad   Highest education level: Not on file  Occupational History   Occupation: retired English as a second language teacher  Tobacco Use   Smoking status: Never   Smokeless tobacco: Never  Vaping Use   Vaping Use: Never used  Substance and Sexual Activity   Alcohol use: No    Alcohol/week: 0.0 standard drinks   Drug use: No   Sexual activity: Yes  Other Topics Concern   Not on file  Social History Narrative   Lives with wife    Drinks 2 cups of coffee a day   Social Determinants of Health   Financial Resource Strain: Low Risk    Difficulty of Paying Living Expenses: Not hard at all  Food Insecurity: No Food Insecurity   Worried About Charity fundraiser in the Last Year: Never true   Arboriculturist in the Last Year: Never true  Transportation Needs: No Transportation  Needs   Lack of Transportation (Medical): No   Lack of Transportation (Non-Medical): No  Physical Activity: Sufficiently Active   Days of Exercise per Week: 5 days   Minutes of Exercise per Session: 30 min  Stress: Stress Concern Present   Feeling of Stress : To some extent  Social Connections: Moderately Isolated   Frequency of Communication with Friends and Family: More than three times a week   Frequency of Social Gatherings with Friends and Family: More than three times a week   Attends Religious Services: Never   Marine scientist or Organizations: No   Attends Archivist Meetings: Never   Marital Status: Married    Tobacco Counseling Counseling given: Not  Answered   Clinical Intake:  Pre-visit preparation completed: Yes  Pain : No/denies pain     BMI - recorded: 24.58 Nutritional Status: BMI of 19-24  Normal Nutritional Risks: None Diabetes: Yes CBG done?: No Did pt. bring in CBG monitor from home?: No  How often do you need to have someone help you when you read instructions, pamphlets, or other written materials from your doctor or pharmacy?: 1 - Never  Diabetic?Nutrition Risk Assessment:  Has the patient had any N/V/D within the last 2 months?  No  Does the patient have any non-healing wounds?  No  Has the patient had any unintentional weight loss or weight gain?  No   Diabetes:  Is the patient diabetic?  Yes  If diabetic, was a CBG obtained today?  No  Did the patient bring in their glucometer from home?  No  How often do you monitor your CBG's? Week;y  Financial Strains and Diabetes Management:  Are you having any financial strains with the device, your supplies or your medication? No .  Does the patient want to be seen by Chronic Care Management for management of their diabetes?  No  Would the patient like to be referred to a Nutritionist or for Diabetic Management?  No   Diabetic Exams:  Diabetic Eye Exam: Completed  04/01/20 Diabetic Foot Exam: Completed 10/26/20   Interpreter Needed?: No  Information entered by :: Charlott Rakes, LPN   Activities of Daily Living In your present state of health, do you have any difficulty performing the following activities: 01/08/2021 10/26/2020  Hearing? Y N  Comment hearing aids -  Vision? N N  Difficulty concentrating or making decisions? N N  Walking or climbing stairs? N N  Dressing or bathing? N N  Doing errands, shopping? N N  Preparing Food and eating ? N -  Using the Toilet? N -  In the past six months, have you accidently leaked urine? N -  Do you have problems with loss of bowel control? N -  Managing your Medications? N -  Managing your Finances? N -  Housekeeping or managing your Housekeeping? N -  Some recent data might be hidden    Patient Care Team: [name], MD as PCP - General (Family Medicine) Leta Baptist, Earlean Polka, MD as Consulting Physician (Neurology) Garlan Fair, MD as Consulting Physician (Gastroenterology) Irine Seal, MD as Attending Physician (Urology) Marchia Bond, MD as Consulting Physician (Orthopedic Surgery) Debbora Presto, NP as Nurse Practitioner (Neurology) Otelia Sergeant, OD as Consulting Physician (Optometry) Thornton Park, MD as Consulting Physician (Gastroenterology) Madelin Rear, [facility] as Pharmacist (Pharmacist)  Indicate any recent Medical Services you may have received from other than [facility] providers in the past year (date may be approximate).     Assessment:   This is a routine wellness examination for Patricio.  Hearing/Vision screen Hearing Screening - Comments:: Pt wears hearing aids  Vision Screening - Comments:: Pt follows up with Dr Blair Heys for annual eye exams   Dietary issues and exercise activities discussed: Current Exercise Habits: Home exercise routine, Type of exercise: walking;Other - see comments (stationary bike), Time (Minutes): 30, Frequency (Times/Week): 5, Weekly Exercise  (Minutes/Week): 150   Goals Addressed             This Visit's Progress    Patient Stated       Regulate A1C         Depression Screen [facility] 2/9 Scores 01/08/2021 10/26/2020 10/07/2019 10/07/2019 09/05/2018 09/21/2017  PHQ - 2 Score 0 0 0 0 0 0  PHQ- 9 Score - - - - - 0    Fall Risk Fall Risk  01/08/2021 10/26/2020 10/07/2019 10/07/2019 09/05/2018  Falls in the past year? 0 0 0 0 0  Number falls in past yr: 0 0 0 - -  Injury with Fall? 0 0 0 - -  Risk for fall due to : Impaired vision - - - -  Follow up Falls prevention discussed - Falls evaluation completed;Education provided;Falls prevention discussed Falls evaluation completed -    FALL RISK PREVENTION PERTAINING TO THE HOME:  Any stairs in or around the home? Yes  If so, are there any without handrails? No  Home free of loose throw rugs in walkways, pet beds, electrical cords, etc? Yes  Adequate lighting in your home to reduce risk of falls? Yes   ASSISTIVE DEVICES UTILIZED TO PREVENT FALLS:  Life alert? No  Use of a cane, walker or w/c? No  Grab bars in the bathroom? No  Shower chair or bench in shower? Yes  Elevated toilet seat or a handicapped toilet? No   TIMED UP AND GO:  Was the test performed? No     Cognitive Function: MMSE - Mini Mental State Exam 09/05/2018  Orientation to time 5  Orientation to Place 5  Registration 3  Attention/ Calculation 5  Recall 3  Language- name 2 objects 2  Language- repeat 1  Language- follow 3 step command 3  Language- read & follow direction 1  Write a sentence 1  Copy design 1  Total score 30     6CIT Screen 01/08/2021 10/07/2019  What Year? 0 points 0 points  What month? 0 points 0 points  What time? 0 points 0 points  Count back from 20 0 points 0 points  Months in reverse 0 points 0 points  Repeat phrase 0 points 0 points  Total Score 0 0    Immunizations Immunization History  Administered Date(s) Administered   Fluad Quad(high Dose 65+) 03/30/2020   Influenza,  High Dose Seasonal PF 05/09/2016, 05/04/2017, 04/12/2018, 03/30/2020   Influenza,inj,Quad PF,6+ Mos 04/05/2019   Influenza-Unspecified 05/01/2015   PFIZER Comirnaty(Gray Top)Covid-19 Tri-Sucrose Vaccine 10/28/2020   PFIZER(Purple Top)SARS-COV-2 Vaccination 08/22/2019, 09/12/2019, 03/30/2020   Pneumococcal Conjugate-13 10/03/2016   Pneumococcal Polysaccharide-23 01/27/2011, 09/21/2017   Tdap 01/27/2011   Zoster Recombinat (Shingrix) 06/25/2018, 09/06/2018   Zoster, Live 01/19/2010    TDAP status: Up to date  Flu Vaccine status: Up to date  Pneumococcal vaccine status: Up to date  Covid-19 vaccine status: Completed vaccines  Qualifies for Shingles Vaccine? Yes   Zostavax completed Yes   Shingrix Completed?: Yes  Screening Tests Health Maintenance  Topic Date Due   TETANUS/TDAP  01/26/2021   INFLUENZA VACCINE  03/01/2021   OPHTHALMOLOGY EXAM  04/01/2021   HEMOGLOBIN A1C  04/28/2021   FOOT EXAM  10/26/2021   COLONOSCOPY (Pts 45-23yrs Insurance coverage will need to be confirmed)  06/19/2030   COVID-19 Vaccine  Completed   Hepatitis C Screening  Completed   PNA vac Low Risk Adult  Completed   Zoster Vaccines- Shingrix  Completed   HPV VACCINES  Aged Out    Health Maintenance  There are no preventive care reminders to display for this patient.  Colorectal cancer screening: Type of screening: Colonoscopy. Completed 06/19/20. Repeat every 10 years   Additional Screening:  Hepatitis C Screening:  Completed 7//26/19  Vision Screening: Recommended annual ophthalmology exams for early detection  of glaucoma and other disorders of the eye. Is the patient up to date with their annual eye exam?  Yes  Who is the provider or what is the name of the office in which the patient attends annual eye exams? Dr Oswaldo Conroy  If pt is not established with a provider, would they like to be referred to a provider to establish care? No .   Dental Screening: Recommended annual dental exams for  proper oral hygiene  Community Resource Referral / Chronic Care Management: CRR required this visit?  No   CCM required this visit?  No      Plan:     I have personally reviewed and noted the following in the patient's chart:   Medical and social history Use of alcohol, tobacco or illicit drugs  Current medications and supplements including opioid prescriptions. Patient is not currently taking opioid prescriptions. Functional ability and status Nutritional status Physical activity Advanced directives List of other physicians Hospitalizations, surgeries, and ER visits in previous 12 months Vitals Screenings to include cognitive, depression, and falls Referrals and appointments  In addition, I have reviewed and discussed with patient certain preventive protocols, quality metrics, and best practice recommendations. A written personalized care plan for preventive services as well as general preventive health recommendations were provided to patient.     Willette Brace, LPN   03/18/4036   Nurse Notes: None

## 2021-01-26 ENCOUNTER — Ambulatory Visit (INDEPENDENT_AMBULATORY_CARE_PROVIDER_SITE_OTHER): Payer: PPO | Admitting: Family Medicine

## 2021-01-26 ENCOUNTER — Encounter: Payer: Self-pay | Admitting: Family Medicine

## 2021-01-26 ENCOUNTER — Other Ambulatory Visit: Payer: Self-pay

## 2021-01-26 VITALS — BP 120/78 | HR 61 | Temp 97.6°F | Wt 174.0 lb

## 2021-01-26 DIAGNOSIS — I10 Essential (primary) hypertension: Secondary | ICD-10-CM | POA: Diagnosis not present

## 2021-01-26 DIAGNOSIS — E782 Mixed hyperlipidemia: Secondary | ICD-10-CM | POA: Diagnosis not present

## 2021-01-26 DIAGNOSIS — M48062 Spinal stenosis, lumbar region with neurogenic claudication: Secondary | ICD-10-CM | POA: Diagnosis not present

## 2021-01-26 DIAGNOSIS — E1142 Type 2 diabetes mellitus with diabetic polyneuropathy: Secondary | ICD-10-CM

## 2021-01-26 LAB — POCT GLYCOSYLATED HEMOGLOBIN (HGB A1C): Hemoglobin A1C: 7.1 % — AB (ref 4.0–5.6)

## 2021-01-26 NOTE — Progress Notes (Signed)
Subjective  CC:  Chief Complaint  Patient presents with   Diabetes   Back Pain    Has returned since his steroid injections he had back in January at [facility], no f/u scheduled. Worried that the injections are raising his A1C    HPI: Shannon Hodge is a 75 y.o. male who presents to the office today for follow up of diabetes and problems listed above in the chief complaint.  Diabetes follow up: His diabetic control is reported as Improved. Has worked on diet but sugars still high at times. Tolerating meds.  He denies exertional CP or SOB or symptomatic hypoglycemia. He denies foot sores. Neuropathy sxs persist.  He realizes that his steroid injections will elevate his blood sugars short-term.  However, they improve his quality life significantly.  Now able to be more active and having less pain. Lumbar stenosis improved as noted above. Peripheral neuropathy continues to be a major problem.  Managed by neurology.  No foot sores.  Working on balance. Blood pressure and lipids: Blood pressure is very well controlled on ACE inhibitor.  We will increase his statin to 40 mg simvastatin nightly.  He is tolerating this well.  This should bring his LDL less than 70.  Wt Readings from Last 3 Encounters:  01/26/21 174 lb (78.9 kg)  10/26/20 181 lb 3.2 oz (82.2 kg)  10/06/20 175 lb (79.4 kg)    BP Readings from Last 3 Encounters:  01/26/21 120/78  10/26/20 116/68  07/03/20 120/64    Assessment  1. Controlled type 2 diabetes mellitus with diabetic polyneuropathy, without long-term current use of insulin ([place])   2. Essential hypertension   3. Mixed hyperlipidemia   4. Spinal stenosis of lumbar region with neurogenic claudication   5. Diabetic polyneuropathy associated with type 2 diabetes mellitus ([place])      Plan  Diabetes is currently well controlled.  Goal A1c is less than 7.0.  We will continue to be active and work on diet.  Continue metformin 2000 daily.  Will monitor.  Understand  that short-term fluctuations in sugars will happen with steroid injections for quality of life is more important.  If he elevates his A1c consistently, will add SGLT2 inhibitor.  Education counseling given not on medication today. Blood pressure lipids at goal.  No changes in ACE or statin dose today. Continue with orthopedics for management of his lumbar stenosis and chronic back pain. Neuropathy per neurology.  Stable but active.  Monitor for foot sores.  Foot care discussed  Follow up: Return in about 3 months (around 07/28/2021) for follow up of diabetes and hypertension.. Orders Placed This Encounter  Procedures   POCT HgB A1C   No orders of the defined types were placed in this encounter.     Immunization History  Administered Date(s) Administered   Fluad Quad(high Dose 65+) 03/30/2020   Influenza, High Dose Seasonal PF 05/09/2016, 05/04/2017, 04/12/2018, 03/30/2020   Influenza,inj,Quad PF,6+ Mos 04/05/2019   Influenza-Unspecified 05/01/2015   PFIZER Comirnaty(Gray Top)Covid-19 Tri-Sucrose Vaccine 10/28/2020   PFIZER(Purple Top)SARS-COV-2 Vaccination 08/22/2019, 09/12/2019, 03/30/2020   Pneumococcal Conjugate-13 10/03/2016   Pneumococcal Polysaccharide-23 01/27/2011, 09/21/2017   Tdap 01/27/2011   Zoster Recombinat (Shingrix) 06/25/2018, 09/06/2018   Zoster, Live 01/19/2010    Diabetes Related Lab Review: Lab Results  Component Value Date   HGBA1C 7.9 (A) 10/26/2020   HGBA1C 6.1 (A) 04/01/2020   HGBA1C 6.1 (A) 10/07/2019    Lab Results  Component Value Date   MICROALBUR <0.7 10/07/2019  Lab Results  Component Value Date   CREATININE 1.21 10/26/2020   BUN 22 10/26/2020   NA 141 10/26/2020   K 4.2 10/26/2020   CL 101 10/26/2020   CO2 33 (H) 10/26/2020   Lab Results  Component Value Date   CHOL 152 10/26/2020   CHOL 146 10/07/2019   CHOL 138 01/03/2019   Lab Results  Component Value Date   HDL 51.30 10/26/2020   HDL 58.20 10/07/2019   HDL 54.20  01/03/2019   Lab Results  Component Value Date   LDLCALC 76 10/26/2020   LDLCALC 71 10/07/2019   LDLCALC 67 01/03/2019   Lab Results  Component Value Date   TRIG 122.0 10/26/2020   TRIG 86.0 10/07/2019   TRIG 83.0 01/03/2019   Lab Results  Component Value Date   CHOLHDL 3 10/26/2020   CHOLHDL 3 10/07/2019   CHOLHDL 3 01/03/2019   No results found for: LDLDIRECT The 10-year ASCVD risk score Mikey Bussing DC Jr., et al., 2013) is: 33.3%   Values used to calculate the score:     Age: 79 years     Sex: Male     Is Non-Hispanic African American: No     Diabetic: Yes     Tobacco smoker: No     Systolic Blood Pressure: 749 mmHg     Is BP treated: Yes     HDL Cholesterol: 51.3 mg/dL     Total Cholesterol: 152 mg/dL I have reviewed the [place], Fam and Soc history. Patient Active Problem List   Diagnosis Date Noted   Inflammatory bowel disease 10/26/2020    Priority: High    Unspecified. Seeing [name]     Essential hypertension 04/01/2020    Priority: High   Spinal stenosis of lumbar region 04/01/2020    Priority: High    Dr. Mardelle Matte     Diabetic polyneuropathy associated with type 2 diabetes mellitus ([place]) 01/01/2019    Priority: High   Restless leg syndrome 09/21/2017    Priority: High   Mixed hyperlipidemia 09/21/2017    Priority: High   Controlled type 2 diabetes mellitus with diabetic polyneuropathy, without long-term current use of insulin ([place]) 09/01/2016    Priority: High   Insomnia 02/22/2016    Priority: High   Lumbar radiculopathy 11/04/2014    Priority: High   Hearing loss 09/21/2017    Priority: Medium    Wears hearing aides     Muscle cramp, nocturnal 09/21/2017    Priority: Medium   Foot drop, 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 He has never used smokeless tobacco. He reports that he does not drink alcohol and does not use drugs.  Review of Systems: Ophthalmic: negative for eye pain, loss of vision or double vision Cardiovascular: negative for chest pain Respiratory: negative for SOB or persistent cough Gastrointestinal: negative for abdominal pain Genitourinary: negative for dysuria or gross hematuria MSK: negative for foot lesions Neurologic: negative for weakness or gait disturbance  Objective  Vitals: BP 120/78   Pulse 61   Temp 97.6 F (36.4 C) (Temporal)   Wt 174 lb (78.9 kg)   SpO2  99%   BMI 23.60 kg/m  General: well appearing, no acute distress  Psych:  Alert and oriented, normal mood and affect Cardiovascular:  Nl S1 and S2, RRR without murmur, gallop or rub. no edema Respiratory:  Good breath sounds bilaterally, CTAB with normal effort, no rales    Diabetic education: ongoing education regarding chronic disease management for diabetes was given today. We continue to reinforce the ABC's of diabetic management: A1c (<7 or 8 dependent upon patient), tight blood pressure control, and cholesterol management with goal LDL < 100 minimally. We discuss diet strategies, exercise recommendations, medication options and possible side effects. At each visit, we review recommended immunizations and preventive care recommendations for diabetics and stress that good diabetic control can prevent other problems. See below for this patient's data.   Commons side effects, risks, benefits, and alternatives for medications and treatment plan prescribed today were discussed, and the patient expressed understanding of the given instructions. Patient is instructed to call or message via MyChart if he/she has any questions or concerns regarding our treatment plan. No barriers to understanding were identified. We discussed  Red Flag symptoms and signs in detail. Patient expressed understanding regarding what to do in case of urgent or emergency type symptoms.  Medication list was reconciled, printed and provided to the patient in AVS. Patient instructions and summary information was reviewed with the patient as documented in the AVS. This note was prepared with assistance of Dragon voice recognition software. Occasional wrong-word or sound-a-like substitutions may have occurred due to the inherent limitations of voice recognition software  This visit occurred during the SARS-CoV-2 public health emergency.  Safety protocols were in place, including screening questions prior to the visit, additional usage of staff PPE, and extensive cleaning of exam room while observing appropriate contact time as indicated for disinfecting solutions.

## 2021-01-26 NOTE — Patient Instructions (Addendum)
Please return in 3 months for hypertension and diabets follow up.   If you have any questions or concerns, please don't hesitate to send me a message via MyChart or call the office at [phone]. Thank you for visiting with [place] today! It's our pleasure caring for you.

## 2021-01-28 ENCOUNTER — Encounter: Payer: Self-pay | Admitting: Family Medicine

## 2021-01-28 DIAGNOSIS — M47816 Spondylosis without myelopathy or radiculopathy, lumbar region: Secondary | ICD-10-CM | POA: Diagnosis not present

## 2021-02-05 ENCOUNTER — Telehealth: Payer: Self-pay

## 2021-02-05 NOTE — Chronic Care Management (AMB) (Signed)
    Chronic Care Management Pharmacy Assistant   Name: Shannon Hodge  MRN: [id number] DOB: [birth year]  Reason for Encounter: CPP Visit Reschedule    Medications: Outpatient Encounter Medications as of 02/05/2021  Medication Sig Note   acetaminophen (TYLENOL) 500 MG tablet Take 500 mg by mouth daily as needed for moderate pain or headache.    amitriptyline (ELAVIL) 25 MG tablet Take 1 tablet (25 mg total) by mouth at bedtime.    cholecalciferol (VITAMIN D) 1000 units tablet Take 1,000 Units by mouth daily.    Coenzyme Q10 (COQ10) 100 MG CAPS Take 1 capsule by mouth daily.    Cyanocobalamin (VITAMIN B-12 PO) Take by mouth daily.    fenofibrate micronized (LOFIBRA) 134 MG capsule Take 1 capsule (134 mg total) by mouth daily before breakfast.    gabapentin (NEURONTIN) 600 MG tablet Take 1.5 tablets (900 mg total) by mouth 2 (two) times daily. 12/02/2020: Taking 600mg  TID    lisinopril (ZESTRIL) 20 MG tablet Take 1 tablet (20 mg total) by mouth daily.    Magnesium Oxide 500 MG TABS Take by mouth.    mesalamine (LIALDA) 1.2 g EC tablet Take 1-2 tablets by mouth prn for abdominal pain and diarrhea. Pt requesting 90 tablets    metFORMIN (GLUCOPHAGE-XR) 500 MG 24 hr tablet Take 4 tablets (2,000 mg total) by mouth daily.    simvastatin (ZOCOR) 40 MG tablet TAKE 1 TABLET BY MOUTH EVERYDAY AT BEDTIME (Patient taking differently: 40 mg. TAKE 1 TABLET BY MOUTH EVERYDAY AT BEDTIME)    valACYclovir (VALTREX) 500 MG tablet Take 500 mg by mouth daily as needed (outbreak).     No facility-administered encounter medications on file as of 02/05/2021.   Original Appointment: 07/11 at 10:30 am Rescheduled phone visit for 07/25 at 1 pm

## 2021-02-08 ENCOUNTER — Telehealth: Payer: PPO

## 2021-02-17 ENCOUNTER — Other Ambulatory Visit: Payer: Self-pay

## 2021-02-17 ENCOUNTER — Telehealth: Payer: Self-pay

## 2021-02-17 MED ORDER — VALACYCLOVIR HCL 500 MG PO TABS: 500.0000 mg | ORAL_TABLET | Freq: Every day | ORAL | 2 refills | Status: DC | PRN

## 2021-02-17 NOTE — Telephone Encounter (Signed)
  LAST APPOINTMENT DATE:  01/26/21  NEXT APPOINTMENT DATE:@Visit  date not found  MEDICATION:valACYclovir (VALTREX) 500 MG tablet  PHARMACY:[id number] [place], [place] - [address]

## 2021-02-17 NOTE — Telephone Encounter (Signed)
Medication refilled

## 2021-02-18 ENCOUNTER — Other Ambulatory Visit: Payer: Self-pay | Admitting: Diagnostic Neuroimaging

## 2021-02-22 ENCOUNTER — Ambulatory Visit (INDEPENDENT_AMBULATORY_CARE_PROVIDER_SITE_OTHER): Payer: PPO | Admitting: Pharmacist

## 2021-02-22 DIAGNOSIS — E1142 Type 2 diabetes mellitus with diabetic polyneuropathy: Secondary | ICD-10-CM | POA: Diagnosis not present

## 2021-02-22 DIAGNOSIS — E782 Mixed hyperlipidemia: Secondary | ICD-10-CM

## 2021-02-22 NOTE — Patient Instructions (Addendum)
Visit Information   Goals Addressed             This Visit's Progress    Monitor and Manage My Blood Sugar-Diabetes Type 2       Timeframe:  Long-Range Goal Priority:  High Start Date: 02/22/21                            Expected End Date:  08/25/21                     Follow Up Date 05/31/21    - check blood sugar at prescribed times - check blood sugar before and after exercise - take the blood sugar log to all doctor visits    Why is this important?   Checking your blood sugar at home helps to keep it from getting very high or very low.  Writing the results in a diary or log helps the doctor know how to care for you.  Your blood sugar log should have the time, date and the results.  Also, write down the amount of insulin or other medicine that you take.  Other information, like what you ate, exercise done and how you were feeling, will also be helpful.     Notes:      Patient Stated   On track    Regulate A1C        Patient Care Plan: CCM Pharmacy Care Plan     Problem Identified: DMII, Insomnia, inflammatory bowel disease, b12 deficiency, HLD, RLS, low testosterone, HTN   Priority: High     Long-Range Goal: Disease Management   Start Date: 12/02/2020  Expected End Date: 12/02/2021  Recent Progress: On track  Priority: High  Note:   Current Barriers:  HLD control, DM control  Pharmacist Clinical Goal(s):  Patient will contact provider office for questions/concerns as evidenced notation of same in electronic health record through collaboration with PharmD and provider.   Interventions: 1:1 collaboration with [name], MD regarding development and update of comprehensive plan of care as evidenced by provider attestation and co-signature Inter-disciplinary care team collaboration (see longitudinal plan of care) Comprehensive medication review performed; medication list updated in electronic medical record No medication changes  Hypertension (BP goal  <130/80) -Controlled. Consistently at goal during OVs -Current treatment: Lisinopril 20 mg once daily  -Denies hypotensive/hypertensive symptoms -Counseled to monitor BP at home as directed, document, and provide log at future appointments -Recommended to continue current medication  Hyperlipidemia: (LDL goal < 70) -Not ideally controlled  -10 yr ASCVD 36% -Simvastatin increased to 40 mg following 09/2020 OV -Current treatment: Simvastatin 40 mg once daily Fenofibrate 134 mg once daily  -Educated on Cholesterol goals;  -Recommended to continue current medication  Update 02/22/21 No updated lipids since last visit. Increased dose of simvastatin should get patient to LDL < 70. Recommend repeat lipid panel at next OV. Reinforced adherence, patient takes daily with no concerns.  Diabetes (A1c goal <7%) -Not ideally controlled -last POC a1c 7.9% 10/26/2020 up from previous 6.1%. has been consistently at goal previously. -now receiving steroid injections, plans to hold off on 2nd injection for as long as possible. Last injection 08/2020. -Current medications: Metformin XR 500 mg tab - 4 tabs (2000 mg) once daily with dinner -Medications previously tried: no other medications per med hx.   -Current home glucose readings -Denies hypoglycemic/hyperglycemic symptoms -Current meal patterns: Patient follows a special diet due to  BS, he eats whole wheat, tofu snacks, hummus, veggies, no fried foods.  His diet consists of salt oriented foods as that is his preference rather than sugar.   -Current exercise: Patient does a lot of cardio, including walking outside and walking on treadmill.   -Educated on A1c and blood sugar goals; -Counseled to check feet daily and get yearly eye exams  Update 02/22/21 A1c down to 7.1 at last OV from 7.9%! Patient now in process of ablation for pain treatments which are non-steroidal and should help avoid spikles in A1c. Continues to be adherent with medication.   No need for additional treatment at this time will continue to monitor.  Continue current meds  Insomnia (Goal: ensure sleep hygiene) -Has somewhat improved with recent medications for pain. Is content with amount of sleep, feels rested enough throughout the day -Current treatment  N/A -Medications previously tried: zolpidem ~'3mg'$  once daily, no OTCs -Counseled on sleep hygiene, could consider otc melatonin sleep aid  Patient Goals/Self-Care Activities Patient will:  - target a minimum of 150 minutes of moderate intensity exercise weekly  Follow Up Plan: [place] f/u 2 months  Medication Assistance: None required.  Patient affirms current coverage meets needs.  Patient's preferred pharmacy is:  [id number] [place], [place] - [address] [place] [address]Phone: [phone]: [phone] ELower Brule([facility] - [place]Phone: [phone]: [phone] Follow Up:  Patient agrees to Care Plan and Follow-up.      The patient verbalized understanding of instructions, educational materials, and care plan provided today and agreed to receive a mailed copy of patient instructions, educational materials, and care plan.  Telephone follow up appointment with pharmacy team member scheduled for: 6 months  CEdythe Clarity [place]

## 2021-02-22 NOTE — Progress Notes (Signed)
Chronic Care Management Pharmacy Note  02/22/2021 Name:  Shannon Hodge MRN:  [id number] DOB:  Jan 31, 1946  Subjective: Shannon Hodge is an 75 y.o. year old male who is a primary patient of [name], MD.  The CCM team was consulted for assistance with disease management and care coordination needs.    Engaged with patient by telephone for initial visit in response to provider referral for pharmacy case management and/or care coordination services.   Consent to Services:  The patient was given the following information about Chronic Care Management services today, agreed to services, and gave verbal consent: 1. CCM service includes personalized support from designated clinical staff supervised by the primary care provider, including individualized plan of care and coordination with other care providers 2. 24/7 contact phone numbers for assistance for urgent and routine care needs. 3. Service will only be billed when office clinical staff spend 20 minutes or more in a month to coordinate care. 4. Only one practitioner may furnish and bill the service in a calendar month. 5.The patient may stop CCM services at any time (effective at the end of the month) by phone call to the office staff. 6. The patient will be responsible for cost sharing (co-pay) of up to 20% of the service fee (after annual deductible is met). Patient agreed to services and consent obtained.  Patient Care Team: [name], MD as PCP - General (Family Medicine) Penni Bombard, MD as Consulting Physician (Neurology) Garlan Fair, MD as Consulting Physician (Gastroenterology) Irine Seal, MD as Attending Physician (Urology) Marchia Bond, MD as Consulting Physician (Orthopedic Surgery) Debbora Presto, NP as Nurse Practitioner (Neurology) Otelia Sergeant, OD as Consulting Physician (Optometry) Thornton Park, MD as Consulting Physician (Gastroenterology)  Recent office visits:  10/26/20- Billey Chang, MD-Seen  for annual exam, chronic conditions addressed, encounter noted recommended  Increase simvastatin to 40 mg   Recent consult visits:  10/06/20- Thornton Park, MD (Gastro)- Colitis, decreased Lialda to 1-2 tabs prn per patient message , follow up prn  08/07/20-Chesson, Leilani Able, RN(Neurology  Patient message)- started amitriptyline 25 mg  07/03/20- Thornton Park, MD Gertie Fey)- seen for rectal bleeding, increased lialda to 4.8 g daily, instructed to avoid nsaids, follow up 3 months  06/05/20- Thornton Park, MD Gertie Fey)- seen for rectal bleeding, recommended daily stool bulking agent with psyllium or methylcellulose daily, colonoscopy scheduled, labs ordered, high fiber diet, no documented follow up    Hospital visits:  None in previous 6 months  Objective:  Lab Results  Component Value Date   CREATININE 1.21 10/26/2020   CREATININE 1.17 10/07/2019   CREATININE 1.15 01/03/2019    Lab Results  Component Value Date   HGBA1C 7.1 (A) 01/26/2021   Last diabetic Eye exam:  Lab Results  Component Value Date/Time   HMDIABEYEEXA No Retinopathy 02/07/2018 12:00 AM    Last diabetic Foot exam: No results found for: HMDIABFOOTEX      Component Value Date/Time   CHOL 152 10/26/2020 0831   TRIG 122.0 10/26/2020 0831   HDL 51.30 10/26/2020 0831   CHOLHDL 3 10/26/2020 0831   VLDL 24.4 10/26/2020 0831   LDLCALC 76 10/26/2020 0831    Hepatic Function Latest Ref Rng & Units 10/26/2020 10/07/2019 01/03/2019  Total Protein 6.0 - 8.3 g/dL 6.7 6.8 6.7  Albumin 3.5 - 5.2 g/dL 4.8 4.5 4.6  AST 0 - 37 U/L _0 ALT 0 - 53 U/L _1 Alk Phosphatase 39 - 117  U/L 49 42 42  Total Bilirubin 0.2 - 1.2 mg/dL 0.8 0.9 0.8    Lab Results  Component Value Date/Time   TSH 1.16 10/07/2019 08:53 AM   TSH 1.42 03/27/2018 08:33 AM    CBC Latest Ref Rng & Units 10/26/2020 04/14/2020 10/07/2019  WBC 4.0 - 10.5 K/uL 4.7 4.5 4.6  Hemoglobin 13.0 - 17.0 g/dL 12.7(L) 12.8(L) 13.5  Hematocrit 39.0 - 52.0 %  37.5(L) 38.2(L) 38.5(L)  Platelets 150.0 - 400.0 K/uL 193.0 177 187.0    No results found for: VD25OH  Clinical ASCVD:  The 10-year ASCVD risk score Mikey Bussing DC Jr., et al., 2013) is: 33.3%   Values used to calculate the score:     Age: 45 years     Sex: Male     Is Non-Hispanic African American: No     Diabetic: Yes     Tobacco smoker: No     Systolic Blood Pressure: 701 mmHg     Is BP treated: Yes     HDL Cholesterol: 51.3 mg/dL     Total Cholesterol: 152 mg/dL    Social History   Tobacco Use  Smoking Status Never  Smokeless Tobacco Never   BP Readings from Last 3 Encounters:  01/26/21 120/78  10/26/20 116/68  07/03/20 120/64   Pulse Readings from Last 3 Encounters:  01/26/21 61  07/03/20 68  06/19/20 66   Wt Readings from Last 3 Encounters:  01/26/21 174 lb (78.9 kg)  10/26/20 181 lb 3.2 oz (82.2 kg)  10/06/20 175 lb (79.4 kg)    Assessment: Review of patient past medical history, allergies, medications, health status, including review of consultants reports, laboratory and other test data, was performed as part of comprehensive evaluation and provision of chronic care management services.   SDOH:  (Social Determinants of Health) assessments and interventions performed: Yes  CCM Care Plan  No Known Allergies  Medications Reviewed Today     Reviewed by Edythe Clarity, [facility] (Pharmacist) on 02/22/21 at 1338  Med List Status: <None>   Medication Order Taking? Sig Documenting Provider Last Dose Status Informant  acetaminophen (TYLENOL) 500 MG tablet [id number] Yes Take 500 mg by mouth daily as needed for moderate pain or headache. [provider] Taking Active Self  amitriptyline (ELAVIL) 25 MG tablet [id number] Yes Take 1 tablet by mouth at bedtime Penumalli, Earlean Polka, MD Taking Active   cholecalciferol (VITAMIN D) 1000 units tablet [id number] Yes Take 1,000 Units by mouth daily. [provider] Taking Active   Coenzyme Q10 (COQ10) 100 MG CAPS  [id number] Yes Take 1 capsule by mouth daily. [provider] Taking Active   Cyanocobalamin (VITAMIN B-12 PO) [id number] Yes Take by mouth daily. [provider] Taking Active   fenofibrate micronized (LOFIBRA) 134 MG capsule [id number] Yes Take 1 capsule (134 mg total) by mouth daily before breakfast. [name], MD Taking Active   gabapentin (NEURONTIN) 600 MG tablet [id number] Yes Take 1.5 tablets (900 mg total) by mouth 2 (two) times daily. Penni Bombard, MD Taking Active            Med Note Madelin Rear   Wed Dec 02, 2020 10:10 AM) Taking $RemoveBe'600mg'gNTBEPKQK$  TID   lisinopril (ZESTRIL) 20 MG tablet [id number] Yes Take 1 tablet (20 mg total) by mouth daily. [name], MD Taking Active   Magnesium Oxide 500 MG TABS [id number] Yes Take by mouth. [provider] Taking Active   mesalamine (LIALDA) 1.2 g EC tablet [id number] Yes  Take 1-2 tablets by mouth prn for abdominal pain and diarrhea. Pt requesting 90 tablets Thornton Park, MD Taking Active   metFORMIN (GLUCOPHAGE-XR) 500 MG 24 hr tablet [id number] Yes Take 4 tablets (2,000 mg total) by mouth daily. [name], MD Taking Active   simvastatin (ZOCOR) 40 MG tablet [id number] Yes TAKE 1 TABLET BY MOUTH EVERYDAY AT BEDTIME  Patient taking differently: 40 mg. TAKE 1 TABLET BY MOUTH EVERYDAY AT BEDTIME   [name], MD Taking Active   valACYclovir (VALTREX) 500 MG tablet [id number] Yes Take 1 tablet (500 mg total) by mouth daily as needed (outbreak). [name], MD Taking Active             Patient Active Problem List   Diagnosis Date Noted   Inflammatory bowel disease 10/26/2020   Essential hypertension 04/01/2020   Achilles rupture, right 04/01/2020   Spinal stenosis of lumbar region 04/01/2020   Diabetic polyneuropathy associated with type 2 diabetes mellitus ([place]) 01/01/2019   Restless leg syndrome 09/21/2017   Mixed hyperlipidemia 09/21/2017   Hearing loss 09/21/2017   Muscle cramp,  nocturnal 09/21/2017   Vitamin B12 deficiency 09/21/2017   Foot drop, left 09/21/2017   Controlled type 2 diabetes mellitus with diabetic polyneuropathy, without long-term current use of insulin ([place]) 09/01/2016   Allergic rhinitis 02/22/2016   Genital herpes 02/22/2016   Insomnia 02/22/2016   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   Lumbar radiculopathy 11/04/2014    Immunization History  Administered Date(s) Administered   Fluad Quad(high Dose 65+) 03/30/2020   Influenza, High Dose Seasonal PF 05/09/2016, 05/04/2017, 04/12/2018, 03/30/2020   Influenza,inj,Quad PF,6+ Mos 04/05/2019   Influenza-Unspecified 05/01/2015   PFIZER Comirnaty(Gray Top)Covid-19 Tri-Sucrose Vaccine 10/28/2020   PFIZER(Purple Top)SARS-COV-2 Vaccination 08/22/2019, 09/12/2019, 03/30/2020   Pneumococcal Conjugate-13 10/03/2016   Pneumococcal Polysaccharide-23 01/27/2011, 09/21/2017   Tdap 01/27/2011   Zoster Recombinat (Shingrix) 06/25/2018, 09/06/2018   Zoster, Live 01/19/2010   Conditions to be addressed/monitored: DMII, Insomnia, inflammatory bowel disease, b12 deficiency, HLD, RLS, low testosterone, HTN  Care Plan : [place]  Updates made by Edythe Clarity, RPH since 02/22/2021 12:00 AM     Problem: DMII, Insomnia, inflammatory bowel disease, b12 deficiency, HLD, RLS, low testosterone, HTN   Priority: High     Long-Range Goal: Disease Management   Start Date: 12/02/2020  Expected End Date: 12/02/2021  Recent Progress: On track  Priority: High  Note:   Current Barriers:  HLD control, DM control  Pharmacist Clinical Goal(s):  Patient will contact provider office for questions/concerns as evidenced notation of same in electronic health record through collaboration with PharmD and provider.   Interventions: 1:1 collaboration with [name], MD regarding development and update of comprehensive plan of care as evidenced by provider attestation and  co-signature Inter-disciplinary care team collaboration (see longitudinal plan of care) Comprehensive medication review performed; medication list updated in electronic medical record No medication changes  Hypertension (BP goal <130/80) -Controlled. Consistently at goal during OVs -Current treatment: Lisinopril 20 mg once daily  -Denies hypotensive/hypertensive symptoms -Counseled to monitor BP at home as directed, document, and provide log at future appointments -Recommended to continue current medication  Hyperlipidemia: (LDL goal < 70) -Not ideally controlled  -10 yr ASCVD 36% -Simvastatin increased to 40 mg following 09/2020 OV -Current treatment: Simvastatin 40 mg once daily Fenofibrate 134 mg once daily  -Educated on Cholesterol goals;  -Recommended to continue current medication  Update 02/22/21 No updated lipids since last visit.  Increased dose of simvastatin should get patient to LDL < 70. Recommend repeat lipid panel at next OV. Reinforced adherence, patient takes daily with no concerns.  Diabetes (A1c goal <7%) -Not ideally controlled -last POC a1c 7.9% 10/26/2020 up from previous 6.1%. has been consistently at goal previously. -now receiving steroid injections, plans to hold off on 2nd injection for as long as possible. Last injection 08/2020. -Current medications: Metformin XR 500 mg tab - 4 tabs (2000 mg) once daily with dinner -Medications previously tried: no other medications per med hx.   -Current home glucose readings -Denies hypoglycemic/hyperglycemic symptoms -Current meal patterns: Patient follows a special diet due to BS, he eats whole wheat, tofu snacks, hummus, veggies, no fried foods.  His diet consists of salt oriented foods as that is his preference rather than sugar.   -Current exercise: Patient does a lot of cardio, including walking outside and walking on treadmill.   -Educated on A1c and blood sugar goals; -Counseled to check feet daily and get  yearly eye exams  Update 02/22/21 A1c down to 7.1 at last OV from 7.9%! Patient now in process of ablation for pain treatments which are non-steroidal and should help avoid spikles in A1c. Continues to be adherent with medication.  No need for additional treatment at this time will continue to monitor.  Continue current meds  Insomnia (Goal: ensure sleep hygiene) -Has somewhat improved with recent medications for pain. Is content with amount of sleep, feels rested enough throughout the day -Current treatment  N/A -Medications previously tried: zolpidem ~52m once daily, no OTCs -Counseled on sleep hygiene, could consider otc melatonin sleep aid  Patient Goals/Self-Care Activities Patient will:  - target a minimum of 150 minutes of moderate intensity exercise weekly  Follow Up Plan: [place]/u 2 months  Medication Assistance: None required.  Patient affirms current coverage meets needs.  Patient's preferred pharmacy is:  [id number] [place], [place] - [address] [place] [id number]: [phone]: [phone] [facility]- [place]hone: [phone]: [phone] Up:  Patient agrees to Care Plan and Follow-up.     Future Appointments  Date Time Provider [place]9/07/2021  9:30 AM LoDebbora PrestoNP [facility] None  01/14/2022  9:30 AM [facility] HEALTH COACH [facility] PEScrantonPharmD Clinical Pharmacist [phone]

## 2021-02-24 DIAGNOSIS — M47816 Spondylosis without myelopathy or radiculopathy, lumbar region: Secondary | ICD-10-CM | POA: Diagnosis not present

## 2021-02-26 ENCOUNTER — Telehealth: Payer: Self-pay | Admitting: Pharmacist

## 2021-02-26 NOTE — Chronic Care Management (AMB) (Signed)
    Chronic Care Management Pharmacy Assistant   Name: Delvante Hilling  MRN: [id number] DOB: Aug 30, 1945   Reason for Encounter: Chart Review    Recent office visits:  None  Recent consult visits:  None  Hospital visits:  None in previous 6 months  Medications: Outpatient Encounter Medications as of 02/26/2021  Medication Sig Note   acetaminophen (TYLENOL) 500 MG tablet Take 500 mg by mouth daily as needed for moderate pain or headache.    amitriptyline (ELAVIL) 25 MG tablet Take 1 tablet by mouth at bedtime    cholecalciferol (VITAMIN D) 1000 units tablet Take 1,000 Units by mouth daily.    Coenzyme Q10 (COQ10) 100 MG CAPS Take 1 capsule by mouth daily.    Cyanocobalamin (VITAMIN B-12 PO) Take by mouth daily.    fenofibrate micronized (LOFIBRA) 134 MG capsule Take 1 capsule (134 mg total) by mouth daily before breakfast.    gabapentin (NEURONTIN) 600 MG tablet Take 1.5 tablets (900 mg total) by mouth 2 (two) times daily. 12/02/2020: Taking '600mg'$  TID    lisinopril (ZESTRIL) 20 MG tablet Take 1 tablet (20 mg total) by mouth daily.    Magnesium Oxide 500 MG TABS Take by mouth.    mesalamine (LIALDA) 1.2 g EC tablet Take 1-2 tablets by mouth prn for abdominal pain and diarrhea. Pt requesting 90 tablets    metFORMIN (GLUCOPHAGE-XR) 500 MG 24 hr tablet Take 4 tablets (2,000 mg total) by mouth daily.    simvastatin (ZOCOR) 40 MG tablet TAKE 1 TABLET BY MOUTH EVERYDAY AT BEDTIME (Patient taking differently: 40 mg. TAKE 1 TABLET BY MOUTH EVERYDAY AT BEDTIME)    valACYclovir (VALTREX) 500 MG tablet Take 1 tablet (500 mg total) by mouth daily as needed (outbreak).    No facility-administered encounter medications on file as of 02/26/2021.    Reviewed chart for medication changes.  No OVs, Consults, or hospital visits since last care coordination call/Pharmacist visit.  No medication changes indicated.  Patient has a scheduled follow up appointment with the clinical pharmacist.  No gaps  in adherence identified.  Future Appointments  Date Time Provider Yoe  04/12/2021  9:30 AM Debbora Presto, NP [facility] None  08/31/2021  4:15 PM [facility] CCM PHARMACIST [facility]  01/14/2022  9:30 AM [facility] HEALTH COACH [facility]     April D [place], [place] Pharmacist Assistant [phone]

## 2021-03-04 ENCOUNTER — Encounter: Payer: Self-pay | Admitting: Family Medicine

## 2021-03-04 DIAGNOSIS — M47816 Spondylosis without myelopathy or radiculopathy, lumbar region: Secondary | ICD-10-CM | POA: Diagnosis not present

## 2021-03-04 DIAGNOSIS — M48061 Spinal stenosis, lumbar region without neurogenic claudication: Secondary | ICD-10-CM | POA: Diagnosis not present

## 2021-03-12 DIAGNOSIS — M48061 Spinal stenosis, lumbar region without neurogenic claudication: Secondary | ICD-10-CM | POA: Diagnosis not present

## 2021-03-17 ENCOUNTER — Other Ambulatory Visit: Payer: Self-pay | Admitting: Gastroenterology

## 2021-03-17 DIAGNOSIS — K529 Noninfective gastroenteritis and colitis, unspecified: Secondary | ICD-10-CM

## 2021-04-01 HISTORY — PX: OTHER SURGICAL HISTORY: SHX169

## 2021-04-08 ENCOUNTER — Encounter: Payer: Self-pay | Admitting: Family Medicine

## 2021-04-08 DIAGNOSIS — M47816 Spondylosis without myelopathy or radiculopathy, lumbar region: Secondary | ICD-10-CM | POA: Diagnosis not present

## 2021-04-08 NOTE — Progress Notes (Signed)
PATIENT: Shannon Hodge DOB: [birth year]  REASON FOR VISIT: follow up HISTORY FROM: patient  Virtual Visit via Telephone Note  I connected with Shannon Hodge on 04/12/21 at  9:30 AM EDT by telephone and verified that I am speaking with the correct person using two identifiers.   I discussed the limitations, risks, security and privacy concerns of performing an evaluation and management service by telephone and the availability of in person appointments. I also discussed with the patient that there may be a patient responsible charge related to this service. The patient expressed understanding and agreed to proceed.   History of Present Illness:  04/12/21 ALL: Tre Barcellona is a 75 y.o. male here today for follow up for diabetic polyneuropathy and leg cramps. He continues gabapentin '900mg'$  BID and amitriptyline '25mg'$  at bedtime. He reports bilateral foot numbness waxes and wanes, R>L. Worse at night. He is tolerating medications. Compression stockings help. He feels symptoms are stable. He is sleeping a little better.   He is followed by Dr Lake Bells with [facility] for lumbar radiculopathy. ESI seemed to help, however A1C increased form 6.1 to 7.9. He then decided to try nerve ablations which have been somewhat effective. A1C back down to 7.1.   History (copied from Dr Gladstone Lighter previous note)  UPDATE (04/07/20, VRP): Since last visit, having more insomnia and nocturnal muscle cramps. Neuropathy symptoms are not painful.   UPDATE (02/20/18, VRP): Since last visit, doing about the same. Symptoms are stable. Severity is mild. No alleviating or aggravating factors. Tolerating gabapentin.   UPDATE 01/27/17: Since last visit, had right achilles tear on 11/15/16. Now healing and recovering. Overall pain in feet and legs are stable.   UPDATE 07/28/16: Since last visit, sxs are slightly worse. Night time cramping, pain and interrupted sleep.   UPDATE 11/23/15: Since last visit, neuropathy sxs are  stable. Exercising more and doing well. Nighttime cramps.    UPDATE 05/20/15: Since last visit, tried PT and using gabapentin. Still with cramps, numbness and left hip pain.   PRIOR HPI (11/04/14): 75 year old right-handed male here for evaluation of low back pain, leg cramps, neuropathy. Patient reports some symptoms starting around age 7-53 years old, but more significant symptoms in the last 6-12 months. In his 51s, patient developed low back pain, rating left side, left leg, with mild left foot drop. Patient was diagnosed with some degenerative lumbar spine disease, possible left L5 radiculopathy, treated conservatively. Patient had lingering symptoms throughout his life. Occasionally he would have cramping, pain in his left leg. In 2000, patient diagnosed diabetes. Over past 1-2 years he has noticed some numbness and tingling in his toes and feet, mainly the bottom. He feels a thick, numb, dead sensation in his feet. No significant electrical, stinging, pins and needles pain in his feet. Patient started on gabapentin by PCP which has mildly helped his numbness symptoms. Now patient having more problems with cramping in his left leg, hamstring region, calf, intermittently, mainly when he lays down or sits down for a long time. Moving, standing, stretching seems to help. He is fairly active at the gym several times per week, using upper body strength machines, weightlifting, treadmill walking.    Observations/Objective:  Generalized: Well developed, in no acute distress  Mentation: Alert oriented to time, place, history taking. Follows all commands speech and language fluent   Assessment and Plan:  75 y.o. year old male  has a past medical history of Basal cell carcinoma, Cataract, Diabetes ([place]), Diabetic neuropathy ([place]),  DJD (degenerative joint disease), Hypercholesteremia, Hyperlipidemia, Hypertension, Inflammatory bowel disease (10/26/2020), Neuropathy, Ruptured Achilles tendon due to trauma  (10/2016), and Ulcerative proctitis ([place]). here with    ICD-10-CM   1. Diabetic polyneuropathy associated with type 2 diabetes mellitus (HCC)  E11.42 gabapentin (NEURONTIN) 600 MG tablet    2. Lumbar radiculopathy  M54.16     3. Leg cramps  R25.2       Bless feels symptoms are stable. We will continue gabapentin '900mg'$  BID and amitriptyline '25mg'$  at bedtime. He will continue to follow up closely with PCP and orthopedics. Healthy lifestyle habits encouraged. He will follow up in 1 year, sooner if needed.    No orders of the defined types were placed in this encounter.   Meds ordered this encounter  Medications   gabapentin (NEURONTIN) 600 MG tablet    Sig: Take 1.5 tablets (900 mg total) by mouth 2 (two) times daily.    Dispense:  270 tablet    Refill:  3    Order Specific Question:   Supervising Provider    Answer:   Melvenia Beam [id number]   amitriptyline (ELAVIL) 25 MG tablet    Sig: Take 1 tablet (25 mg total) by mouth at bedtime.    Dispense:  90 tablet    Refill:  3    Order Specific Question:   Supervising Provider    Answer:   Melvenia Beam [id number]      Follow Up Instructions:  I discussed the assessment and treatment plan with the patient. The patient was provided an opportunity to ask questions and all were answered. The patient agreed with the plan and demonstrated an understanding of the instructions.   The patient was advised to call back or seek an in-person evaluation if the symptoms worsen or if the condition fails to improve as anticipated.  I provided 20 minutes of non-face-to-face time during this encounter. Patient located at their place of residence during [place] visit. Provider is in the office.    Debbora Presto, NP

## 2021-04-12 ENCOUNTER — Encounter: Payer: Self-pay | Admitting: Family Medicine

## 2021-04-12 ENCOUNTER — Telehealth (INDEPENDENT_AMBULATORY_CARE_PROVIDER_SITE_OTHER): Payer: PPO | Admitting: Family Medicine

## 2021-04-12 DIAGNOSIS — R252 Cramp and spasm: Secondary | ICD-10-CM | POA: Diagnosis not present

## 2021-04-12 DIAGNOSIS — E1142 Type 2 diabetes mellitus with diabetic polyneuropathy: Secondary | ICD-10-CM | POA: Diagnosis not present

## 2021-04-12 DIAGNOSIS — M5416 Radiculopathy, lumbar region: Secondary | ICD-10-CM | POA: Diagnosis not present

## 2021-04-12 MED ORDER — GABAPENTIN 600 MG PO TABS: 900.0000 mg | ORAL_TABLET | Freq: Two times a day (BID) | ORAL | 3 refills | Status: DC

## 2021-04-12 MED ORDER — AMITRIPTYLINE HCL 25 MG PO TABS: 25.0000 mg | ORAL_TABLET | Freq: Every day | ORAL | 3 refills | Status: DC

## 2021-04-12 NOTE — Patient Instructions (Signed)
Below is our plan:  We will continue gabapentin '900mg'$  twice daily and amitriptyline '25mg'$  daily at bedtime.   Please make sure you are staying well hydrated. I recommend 50-60 ounces daily. Well balanced diet and regular exercise encouraged. Consistent sleep schedule with 6-8 hours recommended.   Please continue follow up with care team as directed.   Follow up with me in 1 year   You may receive a survey regarding today's visit. I encourage you to leave honest feed back as I do use this information to improve patient care. Thank you for seeing me today!

## 2021-04-15 ENCOUNTER — Telehealth: Payer: Self-pay | Admitting: Pharmacist

## 2021-04-15 NOTE — Progress Notes (Addendum)
Chronic Care Management Pharmacy Assistant   Name: Shannon Hodge  MRN: [id number] DOB: 1946-08-01   Reason for Encounter: Disease State - Diabetes Call     Recent office visits:  None noted.   Recent consult visits:  04/12/21 Debbora Presto, NP - Neurology - Diabetic Polyneuropathy - Continue Gabapentin '900mg'$  BID and Amitriptyline '25mg'$  at bedtime. Follow up in 1 year.   Hospital visits:  None in previous 6 months  Medications: Outpatient Encounter Medications as of 04/15/2021  Medication Sig   acetaminophen (TYLENOL) 500 MG tablet Take 500 mg by mouth daily as needed for moderate pain or headache.   amitriptyline (ELAVIL) 25 MG tablet Take 1 tablet (25 mg total) by mouth at bedtime.   cholecalciferol (VITAMIN D) 1000 units tablet Take 1,000 Units by mouth daily.   Coenzyme Q10 (COQ10) 100 MG CAPS Take 1 capsule by mouth daily.   Cyanocobalamin (VITAMIN B-12 PO) Take by mouth daily.   fenofibrate micronized (LOFIBRA) 134 MG capsule Take 1 capsule (134 mg total) by mouth daily before breakfast.   gabapentin (NEURONTIN) 600 MG tablet Take 1.5 tablets (900 mg total) by mouth 2 (two) times daily.   lisinopril (ZESTRIL) 20 MG tablet Take 1 tablet (20 mg total) by mouth daily.   Magnesium Oxide 500 MG TABS Take by mouth.   mesalamine (LIALDA) 1.2 g EC tablet TAKE 4 TABLETS BY MOUTH DAILY WITH BREAKFAST.   metFORMIN (GLUCOPHAGE-XR) 500 MG 24 hr tablet Take 4 tablets (2,000 mg total) by mouth daily.   simvastatin (ZOCOR) 40 MG tablet TAKE 1 TABLET BY MOUTH EVERYDAY AT BEDTIME (Patient taking differently: 40 mg. TAKE 1 TABLET BY MOUTH EVERYDAY AT BEDTIME)   valACYclovir (VALTREX) 500 MG tablet Take 1 tablet (500 mg total) by mouth daily as needed (outbreak).   No facility-administered encounter medications on file as of 04/15/2021.    Current antihyperglycemic regimen:  Metformin XR 500 mg tab - 4 tabs (2000 mg) twice daily.   What recent interventions/DTPs have been made to improve  glycemic control:  Patient denied any recent changes in his medication regimen.  Have there been any recent hospitalizations or ED visits since last visit with CPP?  Patient has not had any hospitalizations or ED visits since last CPP visit.   Patient denies hypoglycemic symptoms, including Pale, Sweaty, Shaky, Hungry, Nervous/irritable, and Vision changes   Patient denies hyperglycemic symptoms, including blurry vision, excessive thirst, fatigue, polyuria, and weakness   How often are you checking your blood sugar? Patient is not currently checking his blood sugars.   What are your blood sugars ranging? Patient is not currently checking. Fasting: N/A Before meals: N/A After meals: N/A Bedtime: N/A  During the week, how often does your blood glucose drop below 70?  Patient has not been checking his blood sugars but denies any low blood sugars.  Are you checking your feet daily/regularly? Patient reported he does check his feet regularly.     Adherence Review: Is the patient currently on a STATIN medication? Yes Is the patient currently on ACE/ARB medication? Yes Does the patient have >5 day gap between last estimated fill dates? No  Metformin XR 500 mg tab - 4 tabs (2000 mg) once daily with dinner - last filled 02/23/21 90 days    Care Gaps  AWV: done 01/08/21 Colonoscopy: due 06/19/30 DM Eye Exam:  due 04/01/21 (overdue) DEXA: never Mammogram: N/A   Star Rating Drugs: Simvastatin (ZOCOR) 40 MG tablet - last filled 03/25/21 90 days  Metformin (GLUCOPHAGE-XR) 500 MG 24 hr tablet - last filled 02/23/21 90 days  Lisinopril (ZESTRIL) 20 MG tablet - last filled 03/25/21 90 days    Future Appointments  Date Time Provider [place]  05/07/2021  2:30 PM [place], MD [facility]  08/31/2021  4:15 PM [facility] CCM PHARMACIST [facility]  01/14/2022  9:30 AM [facility] HEALTH COACH Yoe, [facility] Clinical Pharmacist Assistant  [phone]

## 2021-04-16 ENCOUNTER — Encounter: Payer: Self-pay | Admitting: Family Medicine

## 2021-04-20 ENCOUNTER — Other Ambulatory Visit: Payer: Self-pay

## 2021-04-20 MED ORDER — FREESTYLE LITE TEST VI STRP: ORAL_STRIP | 12 refills | Status: DC

## 2021-04-20 MED ORDER — FREESTYLE LITE W/DEVICE KIT: 1.0000 | PACK | Freq: Two times a day (BID) | 0 refills | Status: DC | PRN

## 2021-04-20 NOTE — Telephone Encounter (Signed)
Patient calling to follow up if this can be sent in.

## 2021-04-22 DIAGNOSIS — M47816 Spondylosis without myelopathy or radiculopathy, lumbar region: Secondary | ICD-10-CM | POA: Diagnosis not present

## 2021-04-29 DIAGNOSIS — L814 Other melanin hyperpigmentation: Secondary | ICD-10-CM | POA: Diagnosis not present

## 2021-04-29 DIAGNOSIS — D2261 Melanocytic nevi of right upper limb, including shoulder: Secondary | ICD-10-CM | POA: Diagnosis not present

## 2021-04-29 DIAGNOSIS — L821 Other seborrheic keratosis: Secondary | ICD-10-CM | POA: Diagnosis not present

## 2021-05-07 ENCOUNTER — Ambulatory Visit (INDEPENDENT_AMBULATORY_CARE_PROVIDER_SITE_OTHER): Payer: PPO | Admitting: Gastroenterology

## 2021-05-07 ENCOUNTER — Encounter: Payer: Self-pay | Admitting: Gastroenterology

## 2021-05-07 VITALS — BP 120/60 | HR 80 | Ht 72.0 in | Wt 173.0 lb

## 2021-05-07 DIAGNOSIS — K315 Obstruction of duodenum: Secondary | ICD-10-CM

## 2021-05-07 DIAGNOSIS — K529 Noninfective gastroenteritis and colitis, unspecified: Secondary | ICD-10-CM

## 2021-05-07 NOTE — Patient Instructions (Signed)
Please follow up as needed.  If you are age 73 or older, your body mass index should be between 23-30. Your Body mass index is 23.46 kg/m. If this is out of the aforementioned range listed, please consider follow up with your Primary Care Provider.  If you are age 31 or younger, your body mass index should be between 19-25. Your Body mass index is 23.46 kg/m. If this is out of the aformentioned range listed, please consider follow up with your Primary Care Provider.   ________________________________________________________  The [facility] providers would like to encourage you to use [facility] to communicate with providers for non-urgent requests or questions.  Due to long hold times on the telephone, sending your provider a message by [facility] may be a faster and more efficient way to get a response.  Please allow 48 business hours for a response.  Please remember that this is for non-urgent requests.   Due to recent changes in healthcare laws, you may see the results of your imaging and laboratory studies on MyChart before your provider has had a chance to review them.  We understand that in some cases there may be results that are confusing or concerning to you. Not all laboratory results come back in the same time frame and the provider may be waiting for multiple results in order to interpret others.  Please give [place] 48 hours in order for your provider to thoroughly review all the results before contacting the office for clarification of your results.

## 2021-05-07 NOTE — Progress Notes (Signed)
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 Clinically improved on oral mesalamine and previous rowasa enema. He has successfully tapered down to Lialda 2.4 g daily and does not wish to taper further due to adequate control of symptoms.    Normocytic anemia with progression since 09/2019: Low threshold for EGD if no other source identified and anemia does not normalize with improvement in colitis.   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  EGD +/- UGI series with and symptoms.   PLAN: - Continue Lialda to 2.4 g daily - Resume higher dose of mesalamine if symptoms recur - Avoid all NSAIDs - Continue to use Metumucil PRN - Consider trial of dicyclomine 20 mg QID PRN if symptoms return - Follow-up at least annually, earlier with new symptoms  I spent 30 minutes, including in depth chart review, independent review of results, communicating results with the patient directly, face-to-face time with the patient, coordinating care, and ordering studies and medications as appropriate, and documentation.  HPI: Shannon Hodge is a 75 y.o. male who returns in follow-up of inflammatory bowel disease. Initially diagnosed in 2013 by Dr. Wynetta Emery with colonoscopy showing mildly active chronic colitis in cecal biopsies and rectal biopsies.  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 Treated with mesalamine. Told to stop using ibuprofen.  Off treatment for years.  Symptoms recurred after using meloxicam for back pain.   Colonoscopy 06/19/20 showed  congested, erythematous and ulcerated mucosa in the rectum and in the recto-sigmoid colon.  Ileal biopsies were normal. Colon biopsies showed minimally active chronic colitis in the right colon and rectum, but, were otherwise normal.   Symptoms improved after 4 days  of Rowasa and use of Lialda.   He is seen today in follow-up. He continues to do well on mesalamine with formed bowel movements regularly. He feels like mesalamine provides a coating of his colon.  Continues to have difficulty with arthralgias. Steroid injections improve his pain but worsen his diabetes.  He does not find the Tylenol adequately controls his pain.   Labs 04/14/20: hemoglobin 12.8 Labs 06/05/20: CRP <1, ESR 4, fecal calproectin 106, GI pathogen panel negative Labs 10/26/20: normal CMP except for glucose 139, hgb 12.7, platelets 193, MCV 91.3, RDW 12.9  Endoscopic history:  - Colonoscopy with Dr. Wynetta Emery in 2013 showed mildly active chronic colitis in cecal biopsies and rectal biopsies.  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 Treated with mesalamine. Told to stop using ibuprofen.  - EGD and colonoscopy with Dr. [name] 11/01/16 for heme positive stools and a hemoglobin of 12.8. . EGD showed distal duodenal bulb benign stricture preventing intubation of the second portion duodenum. Colonoscopy was normal. Surveillance colonoscopy recommended in 10 years.  - Colonoscopy 06/19/20 showed  congested, erythematous and ulcerated mucosa in the rectum and in the recto-sigmoid colon.   Past Medical History:  Diagnosis Date   Basal cell carcinoma    upper back   Cataract    right eye   Diabetes ([place])    type 2   Diabetic neuropathy (HCC)    both legs   DJD (degenerative joint disease)    L 5   Hypercholesteremia    Hyperlipidemia    Hypertension    Inflammatory bowel disease 10/26/2020   Unspecified. Seeing [facility]   Neuropathy  Ruptured Achilles tendon due to trauma 10/2016   Ulcerative proctitis [facility])     Past Surgical History:  Procedure Laterality Date   basil cell     basil cell/mole removed from back   COLONOSCOPY WITH PROPOFOL N/A 11/01/2016   Procedure: COLONOSCOPY WITH PROPOFOL;  Surgeon: Garlan Fair, MD;  Location:  [facility];  Service: Endoscopy;  Laterality: N/A;   colonscopy     x 2   cortisone injection  04/2021   lower back   ESOPHAGOGASTRODUODENOSCOPY (EGD) WITH PROPOFOL N/A 11/01/2016   Procedure: ESOPHAGOGASTRODUODENOSCOPY (EGD) WITH PROPOFOL;  Surgeon: Garlan Fair, MD;  Location: [facility] ENDOSCOPY;  Service: Endoscopy;  Laterality: N/A;   TONSILLECTOMY      Current Outpatient Medications  Medication Sig Dispense Refill   acetaminophen (TYLENOL) 500 MG tablet Take 500 mg by mouth daily as needed for moderate pain or headache.     amitriptyline (ELAVIL) 25 MG tablet Take 1 tablet (25 mg total) by mouth at bedtime. 90 tablet 3   Blood Glucose Monitoring Suppl (FREESTYLE LITE) w/Device KIT 1 each by Does not apply route 2 (two) times daily as needed. 1 kit 0   cholecalciferol (VITAMIN D) 1000 units tablet Take 1,000 Units by mouth daily.     Coenzyme Q10 (COQ10) 100 MG CAPS Take 1 capsule by mouth daily.     Cyanocobalamin (VITAMIN B-12 PO) Take by mouth daily.     fenofibrate micronized (LOFIBRA) 134 MG capsule Take 1 capsule (134 mg total) by mouth daily before breakfast. 90 capsule 3   gabapentin (NEURONTIN) 600 MG tablet Take 1.5 tablets (900 mg total) by mouth 2 (two) times daily. 270 tablet 3   glucose blood (FREESTYLE LITE) test strip Use as instructed 100 each 12   lisinopril (ZESTRIL) 20 MG tablet Take 1 tablet (20 mg total) by mouth daily. 90 tablet 3   Magnesium Oxide 500 MG TABS Take by mouth.     mesalamine (LIALDA) 1.2 g EC tablet TAKE 4 TABLETS BY MOUTH DAILY WITH BREAKFAST. 360 tablet 1   metFORMIN (GLUCOPHAGE-XR) 500 MG 24 hr tablet Take 4 tablets (2,000 mg total) by mouth daily. 360 tablet 3   simvastatin (ZOCOR) 40 MG tablet TAKE 1 TABLET BY MOUTH EVERYDAY AT BEDTIME (Patient taking differently: 40 mg. TAKE 1 TABLET BY MOUTH EVERYDAY AT BEDTIME) 90 tablet 3   valACYclovir (VALTREX) 500 MG tablet Take 1 tablet (500 mg total) by mouth daily as needed (outbreak). 30 tablet 2    No current facility-administered medications for this visit.    Allergies as of 05/07/2021   (No Known Allergies)    Family History  Problem Relation Age of Onset   Pancreatic cancer Mother    Stroke Father    Brain cancer Father    Diabetes Father    Healthy Daughter    Healthy Son    Diabetes Maternal Grandmother    Diabetes Maternal Grandfather    Diabetes Paternal Grandmother    Diabetes Paternal Grandfather    Colon cancer Neg Hx    Colon polyps Neg Hx    Esophageal cancer Neg Hx    Stomach cancer Neg Hx      Physical Exam: Complete physical exam not performed due to the limits inherent in a telehealth encounter.  Gen: Awake, alert, and oriented, and well communicative. HEENT: EOMI, non-icteric sclera, NCAT, MMM  Neck: Normal movement of head and neck  Pulm: No labored breathing, speaking in full sentences without conversational dyspnea  Derm:  No apparent lesions or bruising in visible field  MS: Moves all visible extremities without noticeable abnormality  Psych: Pleasant, cooperative, normal speech, thought processing seemingly intact     Yun Gutierrez L. Tarri Glenn, MD, MPH 05/07/2021, 2:52 PM

## 2021-05-13 DIAGNOSIS — M47816 Spondylosis without myelopathy or radiculopathy, lumbar region: Secondary | ICD-10-CM | POA: Diagnosis not present

## 2021-05-17 ENCOUNTER — Encounter: Payer: Self-pay | Admitting: Family Medicine

## 2021-05-18 ENCOUNTER — Other Ambulatory Visit: Payer: Self-pay

## 2021-05-18 ENCOUNTER — Encounter: Payer: Self-pay | Admitting: Family Medicine

## 2021-05-18 MED ORDER — ONETOUCH DELICA LANCETS 33G MISC: 1.0000 | Freq: Two times a day (BID) | 2 refills | Status: DC

## 2021-05-18 NOTE — Telephone Encounter (Signed)
Please see his message; needs lancets.

## 2021-05-28 DIAGNOSIS — M62831 Muscle spasm of calf: Secondary | ICD-10-CM | POA: Diagnosis not present

## 2021-05-28 DIAGNOSIS — E119 Type 2 diabetes mellitus without complications: Secondary | ICD-10-CM | POA: Diagnosis not present

## 2021-06-01 DIAGNOSIS — L570 Actinic keratosis: Secondary | ICD-10-CM | POA: Diagnosis not present

## 2021-06-01 DIAGNOSIS — L820 Inflamed seborrheic keratosis: Secondary | ICD-10-CM | POA: Diagnosis not present

## 2021-06-01 DIAGNOSIS — Z789 Other specified health status: Secondary | ICD-10-CM | POA: Diagnosis not present

## 2021-06-01 DIAGNOSIS — L538 Other specified erythematous conditions: Secondary | ICD-10-CM | POA: Diagnosis not present

## 2021-06-01 DIAGNOSIS — D1801 Hemangioma of skin and subcutaneous tissue: Secondary | ICD-10-CM | POA: Diagnosis not present

## 2021-06-01 DIAGNOSIS — L821 Other seborrheic keratosis: Secondary | ICD-10-CM | POA: Diagnosis not present

## 2021-06-01 DIAGNOSIS — L814 Other melanin hyperpigmentation: Secondary | ICD-10-CM | POA: Diagnosis not present

## 2021-06-14 ENCOUNTER — Other Ambulatory Visit: Payer: Self-pay

## 2021-06-14 ENCOUNTER — Encounter: Payer: Self-pay | Admitting: Family Medicine

## 2021-06-14 ENCOUNTER — Ambulatory Visit (INDEPENDENT_AMBULATORY_CARE_PROVIDER_SITE_OTHER): Payer: PPO | Admitting: Family Medicine

## 2021-06-14 VITALS — BP 126/66 | HR 91 | Temp 97.5°F | Ht 72.0 in | Wt 169.2 lb

## 2021-06-14 DIAGNOSIS — I10 Essential (primary) hypertension: Secondary | ICD-10-CM

## 2021-06-14 DIAGNOSIS — K529 Noninfective gastroenteritis and colitis, unspecified: Secondary | ICD-10-CM

## 2021-06-14 DIAGNOSIS — E1142 Type 2 diabetes mellitus with diabetic polyneuropathy: Secondary | ICD-10-CM

## 2021-06-14 LAB — POCT GLYCOSYLATED HEMOGLOBIN (HGB A1C): Hemoglobin A1C: 6.2 % — AB (ref 4.0–5.6)

## 2021-06-14 MED ORDER — MESALAMINE 1.2 G PO TBEC: DELAYED_RELEASE_TABLET | ORAL | 1 refills | Status: DC

## 2021-06-14 MED ORDER — METFORMIN HCL ER 500 MG PO TB24: 2000.0000 mg | ORAL_TABLET | Freq: Every day | ORAL | 3 refills | Status: DC

## 2021-06-14 NOTE — Progress Notes (Signed)
Subjective  CC:  Chief Complaint  Patient presents with   Diabetes    HPI: Shannon Hodge is a 75 y.o. male who presents to the office today for follow up of diabetes and problems listed above in the chief complaint.  Diabetes follow up: His diabetic control is reported as Improved. Eating very well and checking sugars regularly.  He denies exertional CP or SOB or symptomatic hypoglycemia. He denies foot sores. Chronic neuropathy pain persists.  Continues to exercise Chronic back pain and neuropathy: Failed test for ablation therapy from Ortho.  And steroid Epidural injection which did not last long.  No new symptoms. Hypertension on ACE inhibitor is well controlled.  No chest pain or shortness of breath.  No lower extremity edema. Ulcerative colitis: Reviewed GI notes.  Has decreased dose of mesalamine.  This is holding him well. Wt Readings from Last 3 Encounters:  06/14/21 169 lb 3.2 oz (76.7 kg)  05/07/21 173 lb (78.5 kg)  01/26/21 174 lb (78.9 kg)     BP Readings from Last 3 Encounters:  06/14/21 126/66  05/07/21 120/60  01/26/21 120/78    Assessment  1. Controlled type 2 diabetes mellitus with diabetic polyneuropathy, without long-term current use of insulin ([place])   2. Inflammatory bowel disease   3. Diabetic polyneuropathy associated with type 2 diabetes mellitus ([place])   4. Essential hypertension      Plan  Diabetes is currently very well controlled. Continue met xr 2000/day.  IBD: stable on lower dose of mesalamine Neuropathy and back pain: no change in meds. Continue w/ ortho HTN is controlled. Continue lisinopril 20 dialy  Follow up: march 2023 for cpe. Orders Placed This Encounter  Procedures   POCT HgB A1C   Meds ordered this encounter  Medications   mesalamine (LIALDA) 1.2 g EC tablet    Sig: TAKE 2 TABLETS BY MOUTH DAILY WITH BREAKFAST.    Dispense:  360 tablet    Refill:  1   metFORMIN (GLUCOPHAGE-XR) 500 MG 24 hr tablet    Sig: Take 4 tablets (2,000  mg total) by mouth daily.    Dispense:  360 tablet    Refill:  3    Please hold on file for next due refill in January      Immunization History  Administered Date(s) Administered   Fluad Quad(high Dose 65+) 03/30/2020   Influenza, High Dose Seasonal PF 05/09/2016, 05/04/2017, 04/12/2018, 03/30/2020, 04/09/2021   Influenza,inj,Quad PF,6+ Mos 04/05/2019   Influenza-Unspecified 05/01/2015   PFIZER Comirnaty(Gray Top)Covid-19 Tri-Sucrose Vaccine 10/28/2020   PFIZER(Purple Top)SARS-COV-2 Vaccination 08/22/2019, 09/12/2019, 03/30/2020   Pfizer Covid-19 Vaccine Bivalent Booster 27yr & up 04/09/2021   Pneumococcal Conjugate-13 10/03/2016   Pneumococcal Polysaccharide-23 01/27/2011, 09/21/2017   Tdap 01/27/2011   Zoster Recombinat (Shingrix) 06/25/2018, 09/06/2018   Zoster, Live 01/19/2010    Diabetes Related Lab Review: Lab Results  Component Value Date   HGBA1C 6.2 (A) 06/14/2021   HGBA1C 7.1 (A) 01/26/2021   HGBA1C 7.9 (A) 10/26/2020    Lab Results  Component Value Date   MICROALBUR <0.7 10/07/2019   Lab Results  Component Value Date   CREATININE 1.21 10/26/2020   BUN 22 10/26/2020   NA 141 10/26/2020   K 4.2 10/26/2020   CL 101 10/26/2020   CO2 33 (H) 10/26/2020   Lab Results  Component Value Date   CHOL 152 10/26/2020   CHOL 146 10/07/2019   CHOL 138 01/03/2019   Lab Results  Component Value Date   HDL 51.30  10/26/2020   HDL 58.20 10/07/2019   HDL 54.20 01/03/2019   Lab Results  Component Value Date   LDLCALC 76 10/26/2020   LDLCALC 71 10/07/2019   LDLCALC 67 01/03/2019   Lab Results  Component Value Date   TRIG 122.0 10/26/2020   TRIG 86.0 10/07/2019   TRIG 83.0 01/03/2019   Lab Results  Component Value Date   CHOLHDL 3 10/26/2020   CHOLHDL 3 10/07/2019   CHOLHDL 3 01/03/2019   No results found for: LDLDIRECT The 10-year ASCVD risk score (Arnett DK, et al., 2019) is: 35.8%   Values used to calculate the score:     Age: 61 years     Sex:  Male     Is Non-Hispanic African American: No     Diabetic: Yes     Tobacco smoker: No     Systolic Blood Pressure: 706 mmHg     Is BP treated: Yes     HDL Cholesterol: 51.3 mg/dL     Total Cholesterol: 152 mg/dL I have reviewed the PMH, Fam and Soc history. Patient Active Problem List   Diagnosis Date Noted   Inflammatory bowel disease 10/26/2020    Priority: High    Unspecified. Seeing [facility]    Essential hypertension 04/01/2020    Priority: High   Spinal stenosis of lumbar region 04/01/2020    Priority: High    Dr. 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 mellitus ([place]) 01/01/2019    Priority: High   Restless leg syndrome 09/21/2017    Priority: High   Mixed hyperlipidemia 09/21/2017    Priority: High   Controlled type 2 diabetes mellitus with diabetic polyneuropathy, without long-term current use of insulin ([place]) 09/01/2016    Priority: High   Insomnia 02/22/2016    Priority: High   Lumbar radiculopathy 11/04/2014    Priority: High   Hearing loss 09/21/2017    Priority: Medium     Wears hearing aides    Muscle cramp, nocturnal 09/21/2017    Priority: Medium    Foot drop, left 09/21/2017    Priority: Medium    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 He has never used smokeless tobacco. He reports that he does not drink alcohol and does not use drugs.  Review of Systems: Ophthalmic: negative for eye pain, loss of vision or double vision Cardiovascular: negative for chest pain Respiratory: negative for SOB or persistent cough Gastrointestinal: negative for abdominal pain Genitourinary: negative for dysuria or gross hematuria MSK: negative for foot  lesions Neurologic: negative for weakness or gait disturbance  Objective  Vitals: BP 126/66   Pulse 91   Temp (!) 97.5 F (36.4 C) (Temporal)   Ht 6' (1.829 m)   Wt 169 lb 3.2 oz (76.7 kg)   SpO2 95%   BMI 22.95 kg/m  General: well appearing, no acute distress  Psych:  Alert and oriented, normal mood and affect HEENT:  Normocephalic, atraumatic, moist mucous membranes, supple neck  Cardiovascular:  Nl S1 and S2, RRR without murmur, gallop or rub. no edema Respiratory:  Good breath sounds bilaterally, CTAB with normal effort, no rales  Diabetic education: ongoing education regarding chronic disease management for diabetes was given today. We continue to reinforce the ABC's of diabetic management: A1c (<7 or 8 dependent upon patient), tight blood pressure control, and cholesterol management with goal LDL < 100 minimally. We discuss diet strategies, exercise recommendations, medication options and possible side effects. At each visit, we review recommended immunizations and preventive care recommendations for diabetics and stress that good diabetic control can prevent other problems. See below for this patient's data.   Commons side effects, risks, benefits, and alternatives for medications and treatment plan prescribed today were discussed, and the patient expressed understanding of the given instructions. Patient is instructed to call or message via MyChart if he/she has any questions or concerns regarding our treatment plan. No barriers to understanding were identified. We discussed Red Flag symptoms and signs in detail. Patient expressed understanding regarding what to do in case of urgent or emergency type symptoms.  Medication list was reconciled, printed and provided to the patient in AVS. Patient instructions and summary information was reviewed with the patient as documented in the AVS. This note was prepared with assistance of Dragon voice recognition software. Occasional  wrong-word or sound-a-like substitutions may have occurred due to the inherent limitations of voice recognition software  This visit occurred during the SARS-CoV-2 public health emergency.  Safety protocols were in place, including screening questions prior to the visit, additional usage of staff PPE, and extensive cleaning of exam room while observing appropriate contact time as indicated for disinfecting solutions.

## 2021-06-14 NOTE — Patient Instructions (Signed)
Please return in March 2023 for your annual complete physical; please come fasting.   Your diabetes looks good!  If you have any questions or concerns, please don't hesitate to send me a message via MyChart or call the office at [phone]. Thank you for visiting with Shannon Hodge today! It's our pleasure caring for you.

## 2021-06-30 DIAGNOSIS — H2512 Age-related nuclear cataract, left eye: Secondary | ICD-10-CM | POA: Diagnosis not present

## 2021-06-30 DIAGNOSIS — E119 Type 2 diabetes mellitus without complications: Secondary | ICD-10-CM | POA: Diagnosis not present

## 2021-06-30 DIAGNOSIS — H40023 Open angle with borderline findings, high risk, bilateral: Secondary | ICD-10-CM | POA: Diagnosis not present

## 2021-06-30 LAB — HM DIABETES EYE EXAM

## 2021-07-09 ENCOUNTER — Encounter: Payer: Self-pay | Admitting: Family Medicine

## 2021-08-17 NOTE — Progress Notes (Signed)
Chronic Care Management Pharmacy Note  08/31/2021 Name:  Shannon Hodge MRN:  [id number] DOB:  11/25/1945  Subjective: Shannon Hodge is an 76 y.o. year old male who is a primary patient of [name], MD.  The CCM team was consulted for assistance with disease management and care coordination needs.    Engaged with patient by telephone for follow up visit in response to provider referral for pharmacy case management and/or care coordination services.   Consent to Services:  The patient was given the following information about Chronic Care Management services today, agreed to services, and gave verbal consent: 1. CCM service includes personalized support from designated clinical staff supervised by the primary care provider, including individualized plan of care and coordination with other care providers 2. 24/7 contact phone numbers for assistance for urgent and routine care needs. 3. Service will only be billed when office clinical staff spend 20 minutes or more in a month to coordinate care. 4. Only one practitioner may furnish and bill the service in a calendar month. 5.The patient may stop CCM services at any time (effective at the end of the month) by phone call to the office staff. 6. The patient will be responsible for cost sharing (co-pay) of up to 20% of the service fee (after annual deductible is met). Patient agreed to services and consent obtained.  Patient Care Team: [name], MD as PCP - General (Family Medicine) Leta Baptist, Earlean Polka, MD as Consulting Physician (Neurology) Garlan Fair, MD as Consulting Physician (Gastroenterology) Irine Seal, MD as Attending Physician (Urology) Marchia Bond, MD as Consulting Physician (Orthopedic Surgery) Debbora Presto, NP as Nurse Practitioner (Neurology) Otelia Sergeant, OD as Consulting Physician (Optometry) Thornton Park, MD as Consulting Physician (Gastroenterology) Edythe Clarity, [facility] (Pharmacist)  Recent office  visits:  None noted.    Recent consult visits:  04/12/21 Debbora Presto, NP - Neurology - Diabetic Polyneuropathy - Continue Gabapentin 978m BID and Amitriptyline 262mat bedtime. Follow up in 1 year.    Hospital visits:  None in previous 6 months  Objective:  Lab Results  Component Value Date   CREATININE 1.21 10/26/2020   CREATININE 1.17 10/07/2019   CREATININE 1.15 01/03/2019    Lab Results  Component Value Date   HGBA1C 6.2 (A) 06/14/2021   Last diabetic Eye exam:  Lab Results  Component Value Date/Time   HMDIABEYEEXA No Retinopathy 06/30/2021 12:00 AM    Last diabetic Foot exam: No results found for: HMDIABFOOTEX      Component Value Date/Time   CHOL 152 10/26/2020 0831   TRIG 122.0 10/26/2020 0831   HDL 51.30 10/26/2020 0831   CHOLHDL 3 10/26/2020 0831   VLDL 24.4 10/26/2020 0831   LDLCALC 76 10/26/2020 0831    Hepatic Function Latest Ref Rng & Units 10/26/2020 10/07/2019 01/03/2019  Total Protein 6.0 - 8.3 g/dL 6.7 6.8 6.7  Albumin 3.5 - 5.2 g/dL 4.8 4.5 4.6  AST 0 - 37 U/L _0 ALT 0 - 53 U/L _1 Alk Phosphatase 39 - 117 U/L 49 42 42  Total Bilirubin 0.2 - 1.2 mg/dL 0.8 0.9 0.8    Lab Results  Component Value Date/Time   TSH 1.16 10/07/2019 08:53 AM   TSH 1.42 03/27/2018 08:33 AM    CBC Latest Ref Rng & Units 10/26/2020 04/14/2020 10/07/2019  WBC 4.0 - 10.5 K/uL 4.7 4.5 4.6  Hemoglobin 13.0 - 17.0 g/dL 12.7(L) 12.8(L) 13.5  Hematocrit 39.0 -  52.0 % 37.5(L) 38.2(L) 38.5(L)  Platelets 150.0 - 400.0 K/uL 193.0 177 187.0    No results found for: VD25OH  Clinical ASCVD:  The 10-year ASCVD risk score (Arnett DK, et al., 2019) is: 38.1%   Values used to calculate the score:     Age: 66 years     Sex: Male     Is Non-Hispanic African American: No     Diabetic: Yes     Tobacco smoker: No     Systolic Blood Pressure: 182 mmHg     Is BP treated: Yes     HDL Cholesterol: 51.3 mg/dL     Total Cholesterol: 152 mg/dL    Social History   Tobacco Use   Smoking Status Never  Smokeless Tobacco Never   BP Readings from Last 3 Encounters:  06/14/21 126/66  05/07/21 120/60  01/26/21 120/78   Pulse Readings from Last 3 Encounters:  06/14/21 91  05/07/21 80  01/26/21 61   Wt Readings from Last 3 Encounters:  06/14/21 169 lb 3.2 oz (76.7 kg)  05/07/21 173 lb (78.5 kg)  01/26/21 174 lb (78.9 kg)    Assessment: Review of patient past medical history, allergies, medications, health status, including review of consultants reports, laboratory and other test data, was performed as part of comprehensive evaluation and provision of chronic care management services.   SDOH:  (Social Determinants of Health) assessments and interventions performed: Yes  CCM Care Plan  No Known Allergies  Medications Reviewed Today     Reviewed by Edythe Clarity, [facility] (Pharmacist) on 08/31/21 at [place]  Med List Status: <None>   Medication Order Taking? Sig Documenting Provider Last Dose Status Informant  Blood Glucose Monitoring Suppl (FREESTYLE LITE) w/Device KIT [id number] Yes 1 each by Does not apply route 2 (two) times daily as needed. [name], MD Taking Active   cholecalciferol (VITAMIN D) 1000 units tablet [id number] Yes Take 1,000 Units by mouth daily. [provider] Taking Active   Coenzyme Q10 (COQ10) 100 MG CAPS [id number] Yes Take 1 capsule by mouth daily. [provider] Taking Active   Cyanocobalamin (VITAMIN B-12 PO) [id number] Yes Take by mouth daily. [provider] Taking Active   fenofibrate micronized (LOFIBRA) 134 MG capsule [id number] Yes Take 1 capsule (134 mg total) by mouth daily before breakfast. [name], MD Taking Active   gabapentin (NEURONTIN) 600 MG tablet [id number] Yes Take 1.5 tablets (900 mg total) by mouth 2 (two) times daily. Lomax, Amy, NP Taking Active   glucose blood (FREESTYLE LITE) test strip [id number] Yes Use as instructed [name], MD Taking Active   lisinopril (ZESTRIL)  20 MG tablet [id number] Yes Take 1 tablet (20 mg total) by mouth daily. [name], MD Taking Active   Magnesium Oxide 500 MG TABS [id number] Yes Take by mouth. [provider] Taking Active   mesalamine (LIALDA) 1.2 g EC tablet [id number] Yes TAKE 2 TABLETS BY MOUTH DAILY WITH BREAKFAST. [name], MD Taking Active   metFORMIN (GLUCOPHAGE-XR) 500 MG 24 hr tablet [id number] Yes Take 4 tablets (2,000 mg total) by mouth daily. [name], MD Taking Active   OneTouch Delica Lancets 26S MISC [id number] Yes 1 each by Other route 2 (two) times daily. [name], MD Taking Active   simvastatin (ZOCOR) 40 MG tablet [id number] Yes TAKE 1 TABLET BY MOUTH EVERYDAY AT BEDTIME  Patient taking differently: 40 mg. TAKE 1 TABLET BY MOUTH EVERYDAY AT BEDTIME   Jonni Sanger,  Karie Fetch, MD Taking Active   tizanidine (ZANAFLEX) 2 MG capsule [id number] Yes Take 2 mg by mouth daily. 1 tablet [provider] Taking Active   valACYclovir (VALTREX) 500 MG tablet [id number] Yes Take 1 tablet (500 mg total) by mouth daily as needed (outbreak). [name], MD Taking Active             Patient Active Problem List   Diagnosis Date Noted   Inflammatory bowel disease 10/26/2020   Essential hypertension 04/01/2020   Achilles rupture, right 04/01/2020   Spinal stenosis of lumbar region 04/01/2020   Diabetic polyneuropathy associated with type 2 diabetes mellitus ([place]) 01/01/2019   Restless leg syndrome 09/21/2017   Mixed hyperlipidemia 09/21/2017   Hearing loss 09/21/2017   Muscle cramp, nocturnal 09/21/2017   Vitamin B12 deficiency 09/21/2017   Foot drop, left 09/21/2017   Controlled type 2 diabetes mellitus with diabetic polyneuropathy, without long-term current use of insulin ([place]) 09/01/2016   Allergic rhinitis 02/22/2016   Genital herpes 02/22/2016   Insomnia 02/22/2016   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   Lumbar radiculopathy 11/04/2014    Immunization History   Administered Date(s) Administered   Fluad Quad(high Dose 65+) 03/30/2020   Influenza, High Dose Seasonal PF 05/09/2016, 05/04/2017, 04/12/2018, 03/30/2020, 04/09/2021   Influenza,inj,Quad PF,6+ Mos 04/05/2019   Influenza-Unspecified 05/01/2015   PFIZER Comirnaty(Gray Top)Covid-19 Tri-Sucrose Vaccine 10/28/2020   PFIZER(Purple Top)SARS-COV-2 Vaccination 08/22/2019, 09/12/2019, 03/30/2020   Pfizer Covid-19 Vaccine Bivalent Booster 24yr & up 04/09/2021   Pneumococcal Conjugate-13 10/03/2016   Pneumococcal Polysaccharide-23 01/27/2011, 09/21/2017   Tdap 01/27/2011   Zoster Recombinat (Shingrix) 06/25/2018, 09/06/2018   Zoster, Live 01/19/2010   Conditions to be addressed/monitored: DMII, Insomnia, inflammatory bowel disease, b12 deficiency, HLD, RLS, low testosterone, HTN  Care Plan : [place] Updates made by DEdythe Clarity RPH since 08/31/2021 12:00 AM     Problem: DMII, Insomnia, inflammatory bowel disease, b12 deficiency, HLD, RLS, low testosterone, HTN   Priority: High     Long-Range Goal: Disease Management   Start Date: 12/02/2020  Expected End Date: 12/02/2021  Recent Progress: On track  Priority: High  Note:   Current Barriers:  HLD control,  Pharmacist Clinical Goal(s):  Patient will contact provider office for questions/concerns as evidenced notation of same in electronic health record through collaboration with PharmD and provider.   Interventions: 1:1 collaboration with ALeamon Arnt MD regarding development and update of comprehensive plan of care as evidenced by provider attestation and co-signature Inter-disciplinary care team collaboration (see longitudinal plan of care) Comprehensive medication review performed; medication list updated in electronic medical record No medication changes  Hypertension (BP goal <130/80) -Controlled. Consistently at goal during OVs -Current treatment: Lisinopril 20 mg once daily  -Denies  hypotensive/hypertensive symptoms -Counseled to monitor BP at home as directed, document, and provide log at future appointments -Recommended to continue current medication  Hyperlipidemia: (LDL goal < 70) -Not ideally controlled  -10 yr ASCVD 36% -Simvastatin increased to 40 mg following 09/2020 OV -Current treatment: Simvastatin 40 mg once daily Appropriate, Query effective Fenofibrate 134 mg once daily Appropriate, Query effective, -Educated on Cholesterol goals;  -Recommended to continue current medication  Update 02/22/21 No updated lipids since last visit. Increased dose of simvastatin should get patient to LDL < 70. Recommend repeat lipid panel at next OV. Reinforced adherence, patient takes daily with no concerns.  Update 08/31/21 Still no updated lipids since increased dose [place]  increased dose fine. No changes to meds until results.  Diabetes (A1c goal <7%) -Not ideally controlled -last POC a1c 7.9% 10/26/2020 up from previous 6.1%. has been consistently at goal previously. -now receiving steroid injections, plans to hold off on 2nd injection for as long as possible. Last injection 08/2020. -Current medications: Metformin XR 500 mg tab - 4 tabs (2000 mg) once daily with dinner Appropriate, Effective, Safe, Accessible  -Medications previously tried: no other medications per med hx.   -Current home glucose readings -Denies hypoglycemic/hyperglycemic symptoms -Current meal patterns: Patient follows a special diet due to BS, he eats whole wheat, tofu snacks, hummus, veggies, no fried foods.  His diet consists of salt oriented foods as that is his preference rather than sugar.   -Current exercise: Patient does a lot of cardio, including walking outside and walking on treadmill.   -Educated on A1c and blood sugar goals; -Counseled to check feet daily and get yearly eye exams  Update 02/22/21 A1c down to 7.1 at last OV from 7.9%! Patient  now in process of ablation for pain treatments which are non-steroidal and should help avoid spikles in A1c. Continues to be adherent with medication.  No need for additional treatment at this time will continue to monitor. Continue current meds  Update 08/31/21 A1c has decreased all the way to 6.2!! FBG still runs around 100. After diet it runs to 200 sometimes. Working to cut back on carbs. Denies any hypoglycemia < 70.   Insomnia (Goal: ensure sleep hygiene) -Has somewhat improved with recent medications for pain. Is content with amount of sleep, feels rested enough throughout the day -Current treatment  N/A -Medications previously tried: zolpidem ~76m once daily, no OTCs -Counseled on sleep hygiene, could consider otc melatonin sleep aid  Patient Goals/Self-Care Activities Patient will:  - target a minimum of 150 minutes of moderate intensity exercise weekly  Follow [place]  Medication Assistance: None required.  Patient affirms current coverage meets needs.  Patient's preferred pharmacy is:  [id number] [place], [place] - [address] [place] [id number]: [phone]: [phone] [place]hone: [phone]: [phone] Up:  Patient agrees to Care Plan and Follow-up.         Future Appointments  Date Time Provider [place]6/16/2023  9:30 AM [facility] HEBuckheadPharmD Clinical Pharmacist  [phone]

## 2021-08-31 ENCOUNTER — Ambulatory Visit (INDEPENDENT_AMBULATORY_CARE_PROVIDER_SITE_OTHER): Payer: PPO | Admitting: Pharmacist

## 2021-08-31 DIAGNOSIS — E782 Mixed hyperlipidemia: Secondary | ICD-10-CM

## 2021-08-31 DIAGNOSIS — E1142 Type 2 diabetes mellitus with diabetic polyneuropathy: Secondary | ICD-10-CM

## 2021-08-31 NOTE — Patient Instructions (Addendum)
Visit Information   Goals Addressed             This Visit's Progress    Monitor and Manage My Blood Sugar-Diabetes Type 2   On track    Timeframe:  Long-Range Goal Priority:  High Start Date: 02/22/21                            Expected End Date:  08/25/21                     Follow Up Date 05/31/21    - check blood sugar at prescribed times - check blood sugar before and after exercise - take the blood sugar log to all doctor visits    Why is this important?   Checking your blood sugar at home helps to keep it from getting very high or very low.  Writing the results in a diary or log helps the doctor know how to care for you.  Your blood sugar log should have the time, date and the results.  Also, write down the amount of insulin or other medicine that you take.  Other information, like what you ate, exercise done and how you were feeling, will also be helpful.     Notes:        Patient Care Plan: CCM Pharmacy Care Plan     Problem Identified: DMII, Insomnia, inflammatory bowel disease, b12 deficiency, HLD, RLS, low testosterone, HTN   Priority: High     Long-Range Goal: Disease Management   Start Date: 12/02/2020  Expected End Date: 12/02/2021  Recent Progress: On track  Priority: High  Note:   Current Barriers:  HLD control,  Pharmacist Clinical Goal(s):  Patient will contact provider office for questions/concerns as evidenced notation of same in electronic health record through collaboration with PharmD and provider.   Interventions: 1:1 collaboration with [name], MD regarding development and update of comprehensive plan of care as evidenced by provider attestation and co-signature Inter-disciplinary care team collaboration (see longitudinal plan of care) Comprehensive medication review performed; medication list updated in electronic medical record No medication changes  Hypertension (BP goal <130/80) -Controlled. Consistently at goal during  OVs -Current treatment: Lisinopril 20 mg once daily  -Denies hypotensive/hypertensive symptoms -Counseled to monitor BP at home as directed, document, and provide log at future appointments -Recommended to continue current medication  Hyperlipidemia: (LDL goal < 70) -Not ideally controlled  -10 yr ASCVD 36% -Simvastatin increased to 40 mg following 09/2020 OV -Current treatment: Simvastatin 40 mg once daily Appropriate, Query effective Fenofibrate 134 mg once daily Appropriate, Query effective, -Educated on Cholesterol goals;  -Recommended to continue current medication  Update 02/22/21 No updated lipids since last visit. Increased dose of simvastatin should get patient to LDL < 70. Recommend repeat lipid panel at next OV. Reinforced adherence, patient takes daily with no concerns.  Update 08/31/21 Still no updated lipids since increased dose Repeat lipid panel at next OV Tolerating increased dose fine. No changes to meds until results.  Diabetes (A1c goal <7%) -Not ideally controlled -last POC a1c 7.9% 10/26/2020 up from previous 6.1%. has been consistently at goal previously. -now receiving steroid injections, plans to hold off on 2nd injection for as long as possible. Last injection 08/2020. -Current medications: Metformin XR 500 mg tab - 4 tabs (2000 mg) once daily with dinner Appropriate, Effective, Safe, Accessible  -Medications previously tried: no other medications per med hx.   -  Current home glucose readings -Denies hypoglycemic/hyperglycemic symptoms -Current meal patterns: Patient follows a special diet due to BS, he eats whole wheat, tofu snacks, hummus, veggies, no fried foods.  His diet consists of salt oriented foods as that is his preference rather than sugar.   -Current exercise: Patient does a lot of cardio, including walking outside and walking on treadmill.   -Educated on A1c and blood sugar goals; -Counseled to check feet daily and get yearly eye  exams  Update 02/22/21 A1c down to 7.1 at last OV from 7.9%! Patient now in process of ablation for pain treatments which are non-steroidal and should help avoid spikles in A1c. Continues to be adherent with medication.  No need for additional treatment at this time will continue to monitor. Continue current meds  Update 08/31/21 A1c has decreased all the way to 6.2!! FBG still runs around 100. After diet it runs to 200 sometimes. Working to cut back on carbs. Denies any hypoglycemia < 70.   Insomnia (Goal: ensure sleep hygiene) -Has somewhat improved with recent medications for pain. Is content with amount of sleep, feels rested enough throughout the day -Current treatment  N/A -Medications previously tried: zolpidem ~3mg  once daily, no OTCs -Counseled on sleep hygiene, could consider otc melatonin sleep aid  Patient Goals/Self-Care Activities Patient will:  - target a minimum of 150 minutes of moderate intensity exercise weekly  Follow Up Plan: [place] f/u 2 months  Medication Assistance: None required.  Patient affirms current coverage meets needs.  Patient's preferred pharmacy is:  [id number] - [place], [place] - [address] [place] [address] Phone: [phone] Fax: [phone]  [facility]) - [place], [place] [address] Phone: [phone] Fax: [phone]  Follow Up:  Patient agrees to Care Plan and Follow-up.         The patient verbalized understanding of instructions, educational materials, and care plan provided today and declined offer to receive copy of patient instructions, educational materials, and care plan.  Telephone follow up appointment with pharmacy team member scheduled for: 6 months  Edythe Clarity, [place], PharmD Clinical Pharmacist  [facility] [phone]

## 2021-09-01 ENCOUNTER — Encounter: Payer: Self-pay | Admitting: Family Medicine

## 2021-09-02 ENCOUNTER — Other Ambulatory Visit: Payer: Self-pay | Admitting: Neurology

## 2021-09-02 DIAGNOSIS — E1142 Type 2 diabetes mellitus with diabetic polyneuropathy: Secondary | ICD-10-CM

## 2021-09-02 MED ORDER — GABAPENTIN 600 MG PO TABS: 900.0000 mg | ORAL_TABLET | Freq: Two times a day (BID) | ORAL | 1 refills | Status: DC

## 2021-09-16 ENCOUNTER — Other Ambulatory Visit: Payer: Self-pay | Admitting: Family Medicine

## 2021-09-16 DIAGNOSIS — E782 Mixed hyperlipidemia: Secondary | ICD-10-CM

## 2021-09-16 DIAGNOSIS — I10 Essential (primary) hypertension: Secondary | ICD-10-CM

## 2021-10-14 ENCOUNTER — Telehealth: Payer: Self-pay | Admitting: Pharmacist

## 2021-10-14 NOTE — Progress Notes (Signed)
? ? ?  Chronic Care Management ?Pharmacy Assistant  ? ?Name: Shannon Hodge  MRN: [id number] DOB: 26-Jul-1946 ? ?Reason for Encounter: General Adherence Call ?  ? ?Recent office visits:  ?None ? ?Recent consult visits:  ?None ? ?Hospital visits:  ?None in previous 6 months ? ?Medications: ?Outpatient Encounter Medications as of 10/14/2021  ?Medication Sig  ? Blood Glucose Monitoring Suppl (FREESTYLE LITE) w/Device KIT 1 each by Does not apply route 2 (two) times daily as needed.  ? cholecalciferol (VITAMIN D) 1000 units tablet Take 1,000 Units by mouth daily.  ? Coenzyme Q10 (COQ10) 100 MG CAPS Take 1 capsule by mouth daily.  ? Cyanocobalamin (VITAMIN B-12 PO) Take by mouth daily.  ? fenofibrate micronized (LOFIBRA) 134 MG capsule TAKE 1 CAPSULE BY MOUTH DAILY BEFORE BREAKFAST.  ? gabapentin (NEURONTIN) 600 MG tablet Take 1.5 tablets (900 mg total) by mouth 2 (two) times daily.  ? glucose blood (FREESTYLE LITE) test strip Use as instructed  ? lisinopril (ZESTRIL) 20 MG tablet TAKE 1 TABLET BY MOUTH EVERY DAY  ? Magnesium Oxide 500 MG TABS Take by mouth.  ? mesalamine (LIALDA) 1.2 g EC tablet TAKE 2 TABLETS BY MOUTH DAILY WITH BREAKFAST.  ? metFORMIN (GLUCOPHAGE-XR) 500 MG 24 hr tablet Take 4 tablets (2,000 mg total) by mouth daily.  ? OneTouch Delica Lancets 69G MISC 1 each by Other route 2 (two) times daily.  ? simvastatin (ZOCOR) 40 MG tablet TAKE 1 TABLET BY MOUTH EVERYDAY AT BEDTIME  ? tizanidine (ZANAFLEX) 2 MG capsule Take 2 mg by mouth daily. 1 tablet  ? valACYclovir (VALTREX) 500 MG tablet Take 1 tablet (500 mg total) by mouth daily as needed (outbreak).  ? ?No facility-administered encounter medications on file as of 10/14/2021.  ? ?Patient Questions: ?Have you tried alpha lipoic acid for neuropathy? ?Patient states he is currently taking this now. He has been taking it for about 2 weeks and states he hasn't noticed much difference. ? ?Have you had any problems recently with your health? ?Patient denies having any  recent problems with his health. ? ?Have you had any problems with your pharmacy? ?Patient denies any recent problems with his pharmacy. ? ?What issues or side effects are you having with your medications? ?Patient denies having any issues or side effects from any of his medications. ? ?What would you like me to pass along to Leata Mouse, CPP for him to help you with?  ?Patient states he does not have anything for me to pass along at this time. ? ?What can we do to take care of you better? ?Patient did not have any suggestions. ? ?Care Gaps: ?Medicare Annual Wellness: Completed 01/08/2021 ?Ophthalmology Exam: Next due on 06/30/2022 ?Foot Exam: Next due on 10/26/2021 ?Hemoglobin A1C: 6.2% on 06/14/2021 ?Colonoscopy: Next due on 06/19/2030 ? ?Future Appointments  ?Date Time Provider [place]  ?10/29/2021 11:30 AM [name], MD [facility]  ?01/14/2022  9:30 AM [facility] HEALTH COACH [facility]  ?04/04/2022  3:00 PM [facility] CCM PHARMACIST [facility]  ? ?Star Rating Drugs: ?Lisinopril 20 mg last filled 09/17/2021 90 DS ?Simvastatin 40 mg last filled 09/17/2021 90 DS ?Metformin 500 mg last filled 09/02/2021 90 DS ? ?April D Calhoun, [place] ?Clinical Pharmacist Assistant ?[phone]  ?

## 2021-10-15 DIAGNOSIS — M47816 Spondylosis without myelopathy or radiculopathy, lumbar region: Secondary | ICD-10-CM | POA: Diagnosis not present

## 2021-10-29 ENCOUNTER — Ambulatory Visit (INDEPENDENT_AMBULATORY_CARE_PROVIDER_SITE_OTHER): Payer: PPO | Admitting: Family Medicine

## 2021-10-29 ENCOUNTER — Encounter: Payer: Self-pay | Admitting: Family Medicine

## 2021-10-29 VITALS — BP 106/67 | HR 60 | Temp 97.7°F | Ht 72.0 in | Wt 166.2 lb

## 2021-10-29 DIAGNOSIS — E782 Mixed hyperlipidemia: Secondary | ICD-10-CM | POA: Diagnosis not present

## 2021-10-29 DIAGNOSIS — I10 Essential (primary) hypertension: Secondary | ICD-10-CM

## 2021-10-29 DIAGNOSIS — Z0000 Encounter for general adult medical examination without abnormal findings: Secondary | ICD-10-CM

## 2021-10-29 DIAGNOSIS — K529 Noninfective gastroenteritis and colitis, unspecified: Secondary | ICD-10-CM | POA: Diagnosis not present

## 2021-10-29 DIAGNOSIS — E1142 Type 2 diabetes mellitus with diabetic polyneuropathy: Secondary | ICD-10-CM

## 2021-10-29 DIAGNOSIS — G2581 Restless legs syndrome: Secondary | ICD-10-CM | POA: Diagnosis not present

## 2021-10-29 LAB — CBC WITH DIFFERENTIAL/PLATELET
Basophils Absolute: 0 10*3/uL (ref 0.0–0.1)
Basophils Relative: 0.7 % (ref 0.0–3.0)
Eosinophils Absolute: 0.3 10*3/uL (ref 0.0–0.7)
Eosinophils Relative: 6 % — ABNORMAL HIGH (ref 0.0–5.0)
HCT: 34.5 % — ABNORMAL LOW (ref 39.0–52.0)
Hemoglobin: 12 g/dL — ABNORMAL LOW (ref 13.0–17.0)
Lymphocytes Relative: 20.2 % (ref 12.0–46.0)
Lymphs Abs: 0.9 10*3/uL (ref 0.7–4.0)
MCHC: 34.9 g/dL (ref 30.0–36.0)
MCV: 93.5 fl (ref 78.0–100.0)
Monocytes Absolute: 0.3 10*3/uL (ref 0.1–1.0)
Monocytes Relative: 7.1 % (ref 3.0–12.0)
Neutro Abs: 3 10*3/uL (ref 1.4–7.7)
Neutrophils Relative %: 66 % (ref 43.0–77.0)
Platelets: 158 10*3/uL (ref 150.0–400.0)
RBC: 3.69 Mil/uL — ABNORMAL LOW (ref 4.22–5.81)
RDW: 13 % (ref 11.5–15.5)
WBC: 4.5 10*3/uL (ref 4.0–10.5)

## 2021-10-29 LAB — LIPID PANEL
Cholesterol: 110 mg/dL (ref 0–200)
HDL: 43 mg/dL (ref >39.00)
LDL Cholesterol: 50 mg/dL (ref 0–99)
NonHDL: 66.87
Total CHOL/HDL Ratio: 3
Triglycerides: 84 mg/dL (ref 0.0–149.0)
VLDL: 16.8 mg/dL (ref 0.0–40.0)

## 2021-10-29 LAB — MICROALBUMIN / CREATININE URINE RATIO
Creatinine,U: 68.3 mg/dL
Microalb Creat Ratio: 1 mg/g (ref 0.0–30.0)
Microalb, Ur: <0.7 mg/dL (ref 0.0–1.9)

## 2021-10-29 LAB — COMPREHENSIVE METABOLIC PANEL
ALT: 18 U/L (ref 0–53)
AST: 17 U/L (ref 0–37)
Albumin: 4.5 g/dL (ref 3.5–5.2)
Alkaline Phosphatase: 40 U/L (ref 39–117)
BUN: 23 mg/dL (ref 6–23)
CO2: 32 mEq/L (ref 19–32)
Calcium: 9.6 mg/dL (ref 8.4–10.5)
Chloride: 104 mEq/L (ref 96–112)
Creatinine, Ser: 1.14 mg/dL (ref 0.40–1.50)
GFR: 63.91 mL/min (ref >60.00)
Glucose, Bld: 84 mg/dL (ref 70–99)
Potassium: 3.9 mEq/L (ref 3.5–5.1)
Sodium: 141 mEq/L (ref 135–145)
Total Bilirubin: 0.7 mg/dL (ref 0.2–1.2)
Total Protein: 6.6 g/dL (ref 6.0–8.3)

## 2021-10-29 LAB — HEMOGLOBIN A1C: Hgb A1c MFr Bld: 6.4 % (ref 4.6–6.5)

## 2021-10-29 NOTE — Progress Notes (Signed)
? ? ?Subjective  ?CC:  ?Chief Complaint  ?Patient presents with  ? Annual Exam  ?  Pt is not fasting.   ? Hypertension  ? Diabetes  ? Hyperlipidemia  ? ? ?HPI: Shannon Hodge is a 76 y.o. male who presents to the office today for follow up of diabetes and problems listed above in the chief complaint.  ? ?Health maintenance is all current see below; imms are up to date. he is doing well ? ?Chronic problem f/u:  ?Diabetes follow up: His diabetic control is reported as Unchanged. Doing well. Exercises.  He denies exertional CP or SOB or symptomatic hypoglycemia. Chronic peripheral neuropathy. Metformin xr 2000 dialy ?HLD on statin and well controlled and tolerated simvastatin 40 qhs ?Htn on lisinopril 40. Feeling well. Taking medications w/o adverse effects. No symptoms of CHF, angina; no palpitations, sob, cp or lower extremity edema. Compliant with meds.  ?IFD is stable on mesalamine per gi. Feeling much better. No more blood in stool ?Rls on meds and controlled.  ? ?Wt Readings from Last 3 Encounters:  ?10/29/21 166 lb 3.2 oz (75.4 kg)  ?06/14/21 169 lb 3.2 oz (76.7 kg)  ?05/07/21 173 lb (78.5 kg)  ? ? ?BP Readings from Last 3 Encounters:  ?10/29/21 106/67  ?06/14/21 126/66  ?05/07/21 120/60  ? ? ?Assessment  ?1. Controlled type 2 diabetes mellitus with diabetic polyneuropathy, without long-term current use of insulin ([place])   ?2. Diabetic polyneuropathy associated with type 2 diabetes mellitus ([place])   ?3. Essential hypertension   ?4. Inflammatory bowel disease   ?5. Mixed hyperlipidemia   ?6. Restless leg syndrome   ? ?  ?Plan  ?Diabetes is currently very well controlled. Continue met xr 2000 daily. Check renal function and urine screen.  ?HDL recheck lipids. Simvastatin 40 qhs ?HTN is controlled on ace. Lisinoril 40 ad ?Rls on meds is stable. Ropinerol qhs. R/o iron deficiency. ?IFD per GI.  ? ?Follow up: 6 mo for reheck dm and htn. ?Orders Placed This Encounter  ?Procedures  ? CBC with Differential/Platelet  ?  Comprehensive metabolic panel  ? Hemoglobin A1c  ? Lipid panel  ? Microalbumin / creatinine urine ratio  ? ?No orders of the defined types were placed in this encounter. ? ?  ? ?Immunization History  ?Administered Date(s) Administered  ? Fluad Quad(high Dose 65+) 03/30/2020  ? Influenza, High Dose Seasonal PF 05/09/2016, 05/04/2017, 04/12/2018, 03/30/2020, 04/09/2021  ? Influenza,inj,Quad PF,6+ Mos 04/05/2019  ? Influenza-Unspecified 05/01/2015  ? PFIZER Comirnaty(Gray Top)Covid-19 Tri-Sucrose Vaccine 10/28/2020  ? PFIZER(Purple Top)SARS-COV-2 Vaccination 08/22/2019, 09/12/2019, 03/30/2020  ? Pension scheme manager 66yr & up 04/09/2021  ? Pneumococcal Conjugate-13 10/03/2016  ? Pneumococcal Polysaccharide-23 01/27/2011, 09/21/2017  ? Tdap 01/27/2011  ? Zoster Recombinat (Shingrix) 06/25/2018, 09/06/2018  ? Zoster, Live 01/19/2010  ? ? ?Diabetes Related Lab Review: ?Lab Results  ?Component Value Date  ? HGBA1C 6.4 10/29/2021  ? HGBA1C 6.2 (A) 06/14/2021  ? HGBA1C 7.1 (A) 01/26/2021  ?  ?Lab Results  ?Component Value Date  ? MICROALBUR <0.7 10/29/2021  ? ?Lab Results  ?Component Value Date  ? CREATININE 1.14 10/29/2021  ? BUN 23 10/29/2021  ? NA 141 10/29/2021  ? K 3.9 10/29/2021  ? CL 104 10/29/2021  ? CO2 32 10/29/2021  ? ?Lab Results  ?Component Value Date  ? CHOL 110 10/29/2021  ? CHOL 152 10/26/2020  ? CHOL 146 10/07/2019  ? ?Lab Results  ?Component Value Date  ? HDL 43.00 10/29/2021  ? HDL  51.30 10/26/2020  ? HDL 58.20 10/07/2019  ? ?Lab Results  ?Component Value Date  ? [place] 50 10/29/2021  ? [place] 76 10/26/2020  ? [place] 71 10/07/2019  ? ?Lab Results  ?Component Value Date  ? TRIG 84.0 10/29/2021  ? TRIG 122.0 10/26/2020  ? TRIG 86.0 10/07/2019  ? ?Lab Results  ?Component Value Date  ? CHOLHDL 3 10/29/2021  ? CHOLHDL 3 10/26/2020  ? CHOLHDL 3 10/07/2019  ? ?No results found for: LDLDIRECT ?The ASCVD Risk score (Arnett DK, et al., 2019) failed to calculate for the following reasons: ?   The valid total cholesterol range is 130 to 320 mg/dL ?I have reviewed the [place], Fam and Soc history. ?Patient Active Problem List  ? Diagnosis Date Noted  ? Inflammatory bowel disease 10/26/2020  ?  Priority: High  ?  Unspecified. Seeing [facility] ?  ? Essential hypertension 04/01/2020  ?  Priority: High  ? Spinal stenosis of lumbar region 04/01/2020  ?  Priority: High  ?  Dr. Mardelle Matte ?  ? Diabetic polyneuropathy associated with type 2 diabetes mellitus ([place]) 01/01/2019  ?  Priority: High  ? Restless leg syndrome 09/21/2017  ?  Priority: High  ? Mixed hyperlipidemia 09/21/2017  ?  Priority: High  ? Controlled type 2 diabetes mellitus with diabetic polyneuropathy, without long-term current use of insulin ([place]) 09/01/2016  ?  Priority: High  ? Insomnia 02/22/2016  ?  Priority: High  ? Lumbar radiculopathy 11/04/2014  ?  Priority: High  ? Hearing loss 09/21/2017  ?  Priority: Medium   ?  Wears hearing aides ?  ? Muscle cramp, nocturnal 09/21/2017  ?  Priority: Medium   ? Foot drop, left 09/21/2017  ?  Priority: Medium   ? 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  Priority: Medium   ? Vitamin B12 deficiency 09/21/2017  ?  Priority: Low  ? Allergic rhinitis 02/22/2016  ?  Priority: Low  ? Genital herpes 02/22/2016  ?  Priority: Low  ? Achilles rupture, right 04/01/2020  ? 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  ? ? ?Social History: ?Patient  reports that he has never smoked. He has never used smokeless tobacco. He reports that he does not drink alcohol and does not use drugs. ? ?Review of Systems: ?Ophthalmic: negative for eye pain, loss of vision or double vision ?Cardiovascular: negative for chest pain ?Respiratory: negative for SOB or persistent cough ?Gastrointestinal: negative for abdominal pain ?Genitourinary: negative for dysuria or gross hematuria ?MSK: negative for foot lesions ?Neurologic: negative for weakness or gait disturbance ? ?Objective  ?Vitals: BP 106/67   Pulse 60   Temp 97.7 ?F (36.5 ?C) (Temporal)   Ht 6' (1.829 m)   Wt  166 lb 3.2 oz (75.4 kg)   SpO2 99%   BMI 22.54 kg/m?  ?General: well appearing, no acute distress  ?Psych:  Alert and oriented, normal mood and affect ?HEENT:  Normocephalic, atraumatic, moist mucous membranes, supple neck  ?Cardiovascular:  Nl S1 and S2, RRR without murmur, gallop or rub. no edema ?Respiratory:  Good breath sounds bilaterally, CTAB with normal effort, no rales ?Gastrointestinal: normal BS, soft, nontender ?Skin:  Warm, no rashes ?Neurologic:   Mental status is normal. normal gait ?Foot exam: no erythema, pallor, or cyanosis visible nl proprioception and sensation to monofilament testing bilaterally, +2 distal pulses bilaterally ? ? ? ?Diabetic education: ongoing education regarding chronic disease management for diabetes was given today. We continue to reinforce the ABC's of diabetic management: A1c (<7 or 8 dependent  upon patient), tight blood pressure control, and cholesterol management with goal LDL < 100 minimally. We discuss diet strategies, exercise recommendations, medication options and possible side effects. At each visit, we review recommended immunizations and preventive care recommendations for diabetics and stress that good diabetic control can prevent other problems. See below for this patient's data. ? ? ?Commons side effects, risks, benefits, and alternatives for medications and treatment plan prescribed today were discussed, and the patient expressed understanding of the given instructions. Patient is instructed to call or message via MyChart if he/she has any questions or concerns regarding our treatment plan. No barriers to understanding were identified. We discussed Red Flag symptoms and signs in detail. Patient expressed understanding regarding what to do in case of urgent or emergency type symptoms.  ?Medication list was reconciled, printed and provided to the patient in AVS. Patient instructions and summary information was reviewed with the patient as documented in the  AVS. ?This note was prepared with assistance of Systems analyst. Occasional wrong-word or sound-a-like substitutions may have occurred due to the inherent limitations of voice recognition software ? ?

## 2021-10-29 NOTE — Patient Instructions (Signed)
Please return in 6 months to recheck diabetes.   I will release your lab results to you on your MyChart account with further instructions. You may see the results before I do, but when I review them I will send you a message with my report or have my assistant call you if things need to be discussed. Please reply to my message with any questions. Thank you!   If you have any questions or concerns, please don't hesitate to send me a message via MyChart or call the office at [phone]. Thank you for visiting with us today! It's our pleasure caring for you.  

## 2021-11-01 DIAGNOSIS — M47816 Spondylosis without myelopathy or radiculopathy, lumbar region: Secondary | ICD-10-CM | POA: Diagnosis not present

## 2021-11-03 ENCOUNTER — Other Ambulatory Visit: Payer: Self-pay

## 2021-11-03 DIAGNOSIS — R7989 Other specified abnormal findings of blood chemistry: Secondary | ICD-10-CM

## 2021-11-04 ENCOUNTER — Other Ambulatory Visit (INDEPENDENT_AMBULATORY_CARE_PROVIDER_SITE_OTHER): Payer: PPO

## 2021-11-04 DIAGNOSIS — R7989 Other specified abnormal findings of blood chemistry: Secondary | ICD-10-CM

## 2021-11-04 LAB — VITAMIN B12: Vitamin B-12: 337 pg/mL (ref 211–911)

## 2021-11-04 LAB — FOLATE: Folate: 19.1 ng/mL (ref >5.9)

## 2021-11-05 LAB — IRON,TIBC AND FERRITIN PANEL
%SAT: 28 % (calc) (ref 20–48)
Ferritin: 18 ng/mL — ABNORMAL LOW (ref 24–380)
Iron: 114 ug/dL (ref 50–180)
TIBC: 407 mcg/dL (calc) (ref 250–425)

## 2021-11-08 NOTE — Progress Notes (Signed)
Please call patient: I have reviewed his/her lab results. Labs show vit B12 levels are on the low side again, please verify he is taking the vitamin B12 daily '1000mg'$ . If yes, then would set up for monthly injections here.  ?Also, a daily iron pill would be helpful.  ?thanks

## 2021-11-11 ENCOUNTER — Ambulatory Visit (INDEPENDENT_AMBULATORY_CARE_PROVIDER_SITE_OTHER): Payer: PPO | Admitting: *Deleted

## 2021-11-11 DIAGNOSIS — E538 Deficiency of other specified B group vitamins: Secondary | ICD-10-CM

## 2021-11-11 MED ORDER — CYANOCOBALAMIN 1000 MCG/ML IJ SOLN
1000.0000 ug | Freq: Once | INTRAMUSCULAR | Status: AC
  Administered 2021-11-11: 1000 ug via INTRAMUSCULAR

## 2021-11-11 NOTE — Progress Notes (Signed)
Patient presents for B12 injection today. Patient received her B12 injection in left Deltoid. Patient tolerated injection well.  Documentation entered in MAR in EpicCare.   

## 2021-11-17 ENCOUNTER — Other Ambulatory Visit: Payer: Self-pay | Admitting: Family Medicine

## 2021-11-18 ENCOUNTER — Ambulatory Visit (INDEPENDENT_AMBULATORY_CARE_PROVIDER_SITE_OTHER): Payer: PPO

## 2021-11-18 DIAGNOSIS — E538 Deficiency of other specified B group vitamins: Secondary | ICD-10-CM | POA: Diagnosis not present

## 2021-11-18 MED ORDER — CYANOCOBALAMIN 1000 MCG/ML IJ SOLN
1000.0000 ug | Freq: Once | INTRAMUSCULAR | Status: AC
  Administered 2021-11-18: 1000 ug via INTRAMUSCULAR

## 2021-11-18 NOTE — Progress Notes (Signed)
Pt tolerated b12 well. 

## 2021-11-25 ENCOUNTER — Ambulatory Visit (INDEPENDENT_AMBULATORY_CARE_PROVIDER_SITE_OTHER): Payer: PPO

## 2021-11-25 DIAGNOSIS — E538 Deficiency of other specified B group vitamins: Secondary | ICD-10-CM

## 2021-11-25 MED ORDER — CYANOCOBALAMIN 1000 MCG/ML IJ SOLN
1000.0000 ug | Freq: Once | INTRAMUSCULAR | Status: AC
  Administered 2021-11-25: 1000 ug via INTRAMUSCULAR

## 2021-11-25 NOTE — Progress Notes (Signed)
Shannon Hodge 76 yr old male presents for 3rd weekly B12 injection per Billey Chang, MD. Administered CYANOCOBALAMIN 1,000 mcg IM left arm. Patient tolerated well. Aware to return in 1 month for next injection.  ?

## 2021-12-02 DIAGNOSIS — M47816 Spondylosis without myelopathy or radiculopathy, lumbar region: Secondary | ICD-10-CM | POA: Diagnosis not present

## 2021-12-21 ENCOUNTER — Encounter: Payer: Self-pay | Admitting: Gastroenterology

## 2021-12-28 ENCOUNTER — Ambulatory Visit (INDEPENDENT_AMBULATORY_CARE_PROVIDER_SITE_OTHER): Payer: PPO

## 2021-12-28 DIAGNOSIS — E538 Deficiency of other specified B group vitamins: Secondary | ICD-10-CM

## 2021-12-28 MED ORDER — CYANOCOBALAMIN 1000 MCG/ML IJ SOLN
1000.0000 ug | Freq: Once | INTRAMUSCULAR | Status: AC
  Administered 2021-12-28: 1000 ug via INTRAMUSCULAR

## 2021-12-28 NOTE — Progress Notes (Signed)
Pt tolerated b12 injection well. 

## 2022-01-13 DIAGNOSIS — M47816 Spondylosis without myelopathy or radiculopathy, lumbar region: Secondary | ICD-10-CM | POA: Diagnosis not present

## 2022-01-14 ENCOUNTER — Ambulatory Visit (INDEPENDENT_AMBULATORY_CARE_PROVIDER_SITE_OTHER): Payer: PPO

## 2022-01-14 VITALS — BP 104/68 | HR 65 | Temp 97.9°F | Wt 165.2 lb

## 2022-01-14 DIAGNOSIS — Z0000 Encounter for general adult medical examination without abnormal findings: Secondary | ICD-10-CM

## 2022-01-14 NOTE — Patient Instructions (Signed)
Shannon Hodge , Thank you for taking time to come for your Medicare Wellness Visit. I appreciate your ongoing commitment to your health goals. Please review the following plan we discussed and let me know if I can assist you in the future.   Screening recommendations/referrals: Colonoscopy: Done 06/19/20 repeat every 10 years  Recommended yearly ophthalmology/optometry visit for glaucoma screening and checkup Recommended yearly dental visit for hygiene and checkup  Vaccinations: Influenza vaccine: Done 04/09/21 repeat every year  Pneumococcal vaccine: Up to date Tdap vaccine: Discontinue Shingles vaccine: Completed 06/25/18 & 09/06/18   Covid-19: Completed 1/21, 2/11, 03/30/20 & 3/30, 04/09/21 & 11/23/21  Advanced directives: Please bring a copy of your health care power of attorney and living will to the office at your convenience.  Conditions/risks identified: Get consistent sleep   Next appointment: Follow up in one year for your annual wellness visit.   Preventive Care 63 Years and Older, Male Preventive care refers to lifestyle choices and visits with your health care provider that can promote health and wellness. What does preventive care include? A yearly physical exam. This is also called an annual well check. Dental exams once or twice a year. Routine eye exams. Ask your health care provider how often you should have your eyes checked. Personal lifestyle choices, including: Daily care of your teeth and gums. Regular physical activity. Eating a healthy diet. Avoiding tobacco and drug use. Limiting alcohol use. Practicing safe sex. Taking low doses of aspirin every day. Taking vitamin and mineral supplements as recommended by your health care provider. What happens during an annual well check? The services and screenings done by your health care provider during your annual well check will depend on your age, overall health, lifestyle risk factors, and family history of  disease. Counseling  Your health care provider may ask you questions about your: Alcohol use. Tobacco use. Drug use. Emotional well-being. Home and relationship well-being. Sexual activity. Eating habits. History of falls. Memory and ability to understand (cognition). Work and work Statistician. Screening  You may have the following tests or measurements: Height, weight, and BMI. Blood pressure. Lipid and cholesterol levels. These may be checked every 5 years, or more frequently if you are over 75 years old. Skin check. Lung cancer screening. You may have this screening every year starting at age 5 if you have a 30-pack-year history of smoking and currently smoke or have quit within the past 15 years. Fecal occult blood test (FOBT) of the stool. You may have this test every year starting at age 13. Flexible sigmoidoscopy or colonoscopy. You may have a sigmoidoscopy every 5 years or a colonoscopy every 10 years starting at age 61. Prostate cancer screening. Recommendations will vary depending on your family history and other risks. Hepatitis C blood test. Hepatitis B blood test. Sexually transmitted disease (STD) testing. Diabetes screening. This is done by checking your blood sugar (glucose) after you have not eaten for a while (fasting). You may have this done every 1-3 years. Abdominal aortic aneurysm (AAA) screening. You may need this if you are a current or former smoker. Osteoporosis. You may be screened starting at age 5 if you are at high risk. Talk with your health care provider about your test results, treatment options, and if necessary, the need for more tests. Vaccines  Your health care provider may recommend certain vaccines, such as: Influenza vaccine. This is recommended every year. Tetanus, diphtheria, and acellular pertussis (Tdap, Td) vaccine. You may need a Td booster every  10 years. Zoster vaccine. You may need this after age 69. Pneumococcal 13-valent  conjugate (PCV13) vaccine. One dose is recommended after age 60. Pneumococcal polysaccharide (PPSV23) vaccine. One dose is recommended after age 32. Talk to your health care provider about which screenings and vaccines you need and how often you need them. This information is not intended to replace advice given to you by your health care provider. Make sure you discuss any questions you have with your health care provider. Document Released: 08/14/2015 Document Revised: 04/06/2016 Document Reviewed: 05/19/2015 Elsevier Interactive Patient Education  2017 [place] Prevention in the Home Falls can cause injuries. They can happen to people of all ages. There are many things you can do to make your home safe and to help prevent falls. What can I do on the outside of my home? Regularly fix the edges of walkways and driveways and fix any cracks. Remove anything that might make you trip as you walk through a door, such as a raised step or threshold. Trim any bushes or trees on the path to your home. Use bright outdoor lighting. Clear any walking paths of anything that might make someone trip, such as rocks or tools. Regularly check to see if handrails are loose or broken. Make sure that both sides of any steps have handrails. Any raised decks and porches should have guardrails on the edges. Have any leaves, snow, or ice cleared regularly. Use sand or salt on walking paths during winter. Clean up any spills in your garage right away. This includes oil or grease spills. What can I do in the bathroom? Use night lights. Install grab bars by the toilet and in the tub and shower. Do not use towel bars as grab bars. Use non-skid mats or decals in the tub or shower. If you need to sit down in the shower, use a plastic, non-slip stool. Keep the floor dry. Clean up any water that spills on the floor as soon as it happens. Remove soap buildup in the tub or shower regularly. Attach bath mats  securely with double-sided non-slip rug tape. Do not have throw rugs and other things on the floor that can make you trip. What can I do in the bedroom? Use night lights. Make sure that you have a light by your bed that is easy to reach. Do not use any sheets or blankets that are too big for your bed. They should not hang down onto the floor. Have a firm chair that has side arms. You can use this for support while you get dressed. Do not have throw rugs and other things on the floor that can make you trip. What can I do in the kitchen? Clean up any spills right away. Avoid walking on wet floors. Keep items that you use a lot in easy-to-reach places. If you need to reach something above you, use a strong step stool that has a grab bar. Keep electrical cords out of the way. Do not use floor polish or wax that makes floors slippery. If you must use wax, use non-skid floor wax. Do not have throw rugs and other things on the floor that can make you trip. What can I do with my stairs? Do not leave any items on the stairs. Make sure that there are handrails on both sides of the stairs and use them. Fix handrails that are broken or loose. Make sure that handrails are as long as the stairways. Check any carpeting to make  sure that it is firmly attached to the stairs. Fix any carpet that is loose or worn. Avoid having throw rugs at the top or bottom of the stairs. If you do have throw rugs, attach them to the floor with carpet tape. Make sure that you have a light switch at the top of the stairs and the bottom of the stairs. If you do not have them, ask someone to add them for you. What else can I do to help prevent falls? Wear shoes that: Do not have high heels. Have rubber bottoms. Are comfortable and fit you well. Are closed at the toe. Do not wear sandals. If you use a stepladder: Make sure that it is fully opened. Do not climb a closed stepladder. Make sure that both sides of the stepladder  are locked into place. Ask someone to hold it for you, if possible. Clearly mark and make sure that you can see: Any grab bars or handrails. First and last steps. Where the edge of each step is. Use tools that help you move around (mobility aids) if they are needed. These include: Canes. Walkers. Scooters. Crutches. Turn on the lights when you go into a dark area. Replace any light bulbs as soon as they burn out. Set up your furniture so you have a clear path. Avoid moving your furniture around. If any of your floors are uneven, fix them. If there are any pets around you, be aware of where they are. Review your medicines with your doctor. Some medicines can make you feel dizzy. This can increase your chance of falling. Ask your doctor what other things that you can do to help prevent falls. This information is not intended to replace advice given to you by your health care provider. Make sure you discuss any questions you have with your health care provider. Document Released: 05/14/2009 Document Revised: 12/24/2015 Document Reviewed: 08/22/2014 Elsevier Interactive Patient Education  2017 [employer].

## 2022-01-14 NOTE — Progress Notes (Signed)
Subjective:   Shannon Hodge is a 76 y.o. male who presents for Medicare Annual/Subsequent preventive examination.  Review of Systems     Cardiac Risk Factors include: advanced age (>2mn, >>74women);dyslipidemia;hypertension;diabetes mellitus;male gender     Objective:    Today's Vitals   01/14/22 0937  BP: 104/68  Pulse: 65  Temp: 97.9 F (36.6 C)  SpO2: 98%  Weight: 165 lb 3.2 oz (74.9 kg)   Body mass index is 22.41 kg/m.     01/14/2022    9:55 AM 01/08/2021    9:44 AM 10/07/2019    2:46 PM 09/05/2018    9:39 AM 11/01/2016    9:40 AM 10/27/2016    2:46 PM 11/23/2015    9:58 AM  Advanced Directives  Does Patient Have a Medical Advance Directive? _0  Yes Yes  Type of Advance Directive Healthcare Power of Attorney Living will Living will;Healthcare Power of ABataviaLiving will HDeSales UniversityLiving will HGibsonLiving will HArcolaLiving will  Does patient want to make changes to medical advance directive?   No - Patient declined   No - Patient declined   Copy of [place]ings [place] Chart? No - copy requested  No - copy requested No - copy requested No - copy requested      Current Medications (verified) Outpatient Encounter Medications as of 01/14/2022  Medication Sig   Alpha-Lipoic Acid 600 MG TABS Take by mouth.   Ascorbic Acid (VITAMIN C PO) Take by mouth.   Blood Glucose Monitoring Suppl (FREESTYLE LITE) w/Device KIT 1 each by Does not apply route 2 (two) times daily as needed.   celecoxib (CELEBREX) 100 MG capsule Take 100 mg by mouth 2 (two) times daily.   cholecalciferol (VITAMIN D) 1000 units tablet Take 1,000 Units by mouth daily.   Coenzyme Q10 (COQ10) 100 MG CAPS Take 1 capsule by mouth daily.   Cyanocobalamin (VITAMIN B-12 PO) Take by mouth daily.   cyclobenzaprine (FLEXERIL) 5 MG tablet Take 5 mg by mouth at bedtime as needed.   fenofibrate micronized  (LOFIBRA) 134 MG capsule TAKE 1 CAPSULE BY MOUTH DAILY BEFORE BREAKFAST.   Ferrous Sulfate (IRON PO) Take by mouth.   gabapentin (NEURONTIN) 600 MG tablet Take 1.5 tablets (900 mg total) by mouth 2 (two) times daily.   glucose blood (FREESTYLE LITE) test strip Use as instructed   Lancets (FREESTYLE) lancets USE AS DIRECTED 2 TIMES DAILY   lisinopril (ZESTRIL) 20 MG tablet TAKE 1 TABLET BY MOUTH EVERY DAY   Magnesium Oxide 500 MG TABS Take by mouth.   mesalamine (LIALDA) 1.2 g EC tablet TAKE 2 TABLETS BY MOUTH DAILY WITH BREAKFAST.   metFORMIN (GLUCOPHAGE-XR) 500 MG 24 hr tablet Take 4 tablets (2,000 mg total) by mouth daily.   simvastatin (ZOCOR) 40 MG tablet TAKE 1 TABLET BY MOUTH EVERYDAY AT BEDTIME   tiZANidine (ZANAFLEX) 2 MG tablet TAKE 1 TABLET BY MOUTH EVERY 8 HOURS AS NEEDED FOR UP TO 30 DAYS.   valACYclovir (VALTREX) 500 MG tablet Take 1 tablet (500 mg total) by mouth daily as needed (outbreak).   No facility-administered encounter medications on file as of 01/14/2022.    Allergies (verified) Patient has no known allergies.   History: Past Medical History:  Diagnosis Date   Basal cell carcinoma    upper back   Cataract    right eye   Diabetes ([place]    type 2  Diabetic neuropathy (HCC)    both legs   DJD (degenerative joint disease)    L 5   Hypercholesteremia    Hyperlipidemia    Hypertension    Inflammatory bowel disease 10/26/2020   Unspecified. Seeing [facility]   Neuropathy    Ruptured Achilles tendon due to trauma 10/2016   Ulcerative proctitis [facility])    Past Surgical History:  Procedure Laterality Date   basil cell     basil cell/mole removed from back   COLONOSCOPY WITH PROPOFOL N/A 11/01/2016   Procedure: COLONOSCOPY WITH PROPOFOL;  Surgeon: Garlan Fair, MD;  Location: [facility] END[facility]COPY;  Service: Endoscopy;  Laterality: N/A;   colonscopy     x 2   cortisone injection  04/2021   lower back   ESOPHAGOGASTRODUODENOSCOPY (EGD) WITH PROPOFOL N/A  11/01/2016   Procedure: ESOPHAGOGASTRODUODENOSCOPY (EGD) WITH PROPOFOL;  Surgeon: Garlan Fair, MD;  Location: [facility] ENDOSCOPY;  Service: Endoscopy;  Laterality: N/A;   TONSILLECTOMY     Family History  Problem Relation Age of Onset   Pancreatic cancer Mother    Stroke Father    Brain cancer Father    Diabetes Father    Healthy Daughter    Healthy Son    Diabetes Maternal Grandmother    Diabetes Maternal Grandfather    Diabetes Paternal Grandmother    Diabetes Paternal Grandfather    Colon cancer Neg Hx    Colon polyps Neg Hx    Esophageal cancer Neg Hx    Stomach cancer Neg Hx    Social History   Socioeconomic History   Marital status: Married    Spouse name: Diane    Number of children: 3   Years of education: post grad   Highest education level: Not on file  Occupational History   Occupation: retired English as a second language teacher  Tobacco Use   Smoking status: Never   Smokeless tobacco: Never  Vaping Use   Vaping Use: Never used  Substance and Sexual Activity   Alcohol use: No   Drug use: No   Sexual activity: Not Currently  Other Topics Concern   Not on file  Social History Narrative   Lives with wife    Drinks 2 cups of coffee a day   Social Determinants of Health   Financial Resource Strain: Low Risk  (01/14/2022)   Overall Financial Resource Strain (CARDIA)    Difficulty of Paying Living Expenses: Not hard at all  Food Insecurity: No Food Insecurity (01/14/2022)   Hunger Vital Sign    Worried About Running Out of Food in the Last Year: Never true    Ran Out of Food in the Last Year: Never true  Transportation Needs: No Transportation Needs (01/14/2022)   PRAPARE - Hydrologist (Medical): No    Lack of Transportation (Non-Medical): No  Physical Activity: Sufficiently Active (01/14/2022)   Exercise Vital Sign    Days of Exercise per Week: 5 days    Minutes of Exercise per Session: 30 min  Stress: No Stress Concern Present (01/14/2022)   [place]    Feeling of Stress : Only a little  Social Connections: Moderately Isolated (01/14/2022)   Social Connection and Isolation Panel [NHANES]    Frequency of Communication with Friends and Family: More than three times a week    Frequency of Social Gatherings with Friends and Family: More than three times a week    Attends Religious Services: Never  Active Member of Clubs or Organizations: No    Attends Archivist Meetings: Never    Marital Status: Married    Tobacco Counseling Counseling given: Not Answered   Clinical Intake:  Pre-visit preparation completed: Yes  Pain : No/denies pain     BMI - recorded: 22.41 Nutritional Status: BMI of 19-24  Normal Nutritional Risks: None Diabetes: Yes CBG done?: No Did pt. bring in CBG monitor from home?: No  How often do you need to have someone help you when you read instructions, pamphlets, or other written materials from your doctor or pharmacy?: 1 - Never  Diabetic?Nutrition Risk Assessment:  Has the patient had any N/V/D within the last 2 months?  No  Does the patient have any non-healing wounds?  No  Has the patient had any unintentional weight loss or weight gain?  No   Diabetes:  Is the patient diabetic?  Yes  If diabetic, was a CBG obtained today?  No  Did the patient bring in their glucometer from home?  No  How often do you monitor your CBG's? Intermittent .   Financial Strains and Diabetes Management:  Are you having any financial strains with the device, your supplies or your medication? No .  Does the patient want to be seen by Chronic Care Management for management of their diabetes?  No  Would the patient like to be referred to a Nutritionist or for Diabetic Management?  No   Diabetic Exams:  Diabetic Eye Exam: Completed 06/30/21 Diabetic Foot Exam: Completed 10/29/21   Interpreter Needed?: No  Information entered by :: Charlott Rakes, LPN   Activities of Daily Living    01/14/2022    9:57 AM  In your present state of health, do you have any difficulty performing the following activities:  Hearing? 1  Comment wears hearing aid  Vision? 0  Difficulty concentrating or making decisions? 0  Dressing or bathing? 0  Doing errands, shopping? 0  Preparing Food and eating ? N  Using the Toilet? N  In the past six months, have you accidently leaked urine? N  Do you have problems with loss of bowel control? N  Managing your Medications? N  Managing your Finances? N  Housekeeping or managing your Housekeeping? N    Patient Care Team: [name], MD as PCP - General (Family Medicine) Leta Baptist, Earlean Polka, MD as Consulting Physician (Neurology) Garlan Fair, MD as Consulting Physician (Gastroenterology) Irine Seal, MD as Attending Physician (Urology) Marchia Bond, MD as Consulting Physician (Orthopedic Surgery) Debbora Presto, NP as Nurse Practitioner (Neurology) Otelia Sergeant, OD as Consulting Physician (Optometry) Thornton Park, MD as Consulting Physician (Gastroenterology) Edythe Clarity, [facility] (Pharmacist)  Indicate any recent Medical Services you may have received from other than [facility] providers in the past year (date may be approximate).     Assessment:   This is a routine wellness examination for Avigdor.  Hearing/Vision screen Hearing Screening - Comments:: Pt wears a hearing aid  Vision Screening - Comments:: Pt follows up with [facility]  Dietary issues and exercise activities discussed: Current Exercise Habits: Home exercise routine, Type of exercise: Other - see comments;stretching, Time (Minutes): 30, Frequency (Times/Week): 5, Weekly Exercise (Minutes/Week): 150   Goals Addressed             This Visit's Progress    PATIENT STATED       GET CONSISTENT SLEEP       Depression Screen    01/14/2022  9:50 AM 01/08/2021    9:41 AM 10/26/2020    8:06 AM 10/07/2019     2:47 PM 10/07/2019    8:27 AM 09/05/2018    9:39 AM 09/21/2017    9:04 AM  PHQ 2/9 Scores  PHQ - 2 Score 1 0 0 0 0 0 0  PHQ- 9 Score 3      0    Fall Risk    01/14/2022    9:56 AM 01/08/2021    9:45 AM 10/26/2020    8:05 AM 10/07/2019    2:47 PM 10/07/2019    8:27 AM  Fall Risk   Falls in the past year? 0 0 0 0 0  Number falls in past yr: 0 0 0 0   Injury with Fall? 0 0 0 0   Risk for fall due to : Impaired vision;Impaired balance/gait Impaired vision     Follow up Falls prevention discussed Falls prevention discussed  Falls evaluation completed;Education provided;Falls prevention discussed Falls evaluation completed    FALL RISK PREVENTION PERTAINING TO THE HOME:  Any stairs in or around the home? Yes  If so, are there any without handrails? No  Home free of loose throw rugs in walkways, pet beds, electrical cords, etc? Yes  Adequate lighting in your home to reduce risk of falls? Yes   ASSISTIVE DEVICES UTILIZED TO PREVENT FALLS:  Life alert? No  Use of a cane, walker or w/c? No  Grab bars in the bathroom? Yes  Shower chair or bench in shower? Yes  Elevated toilet seat or a handicapped toilet? No   TIMED UP AND GO:  Was the test performed? Yes .  Length of time to ambulate 10 feet: 10 sec.   Gait steady and fast without use of assistive device  Cognitive Function:    09/05/2018    9:40 AM  MMSE - Mini Mental State Exam  Orientation to time 5  Orientation to Place 5  Registration 3  Attention/ Calculation 5  Recall 3  Language- name 2 objects 2  Language- repeat 1  Language- follow 3 step command 3  Language- read & follow direction 1  Write a sentence 1  Copy design 1  Total score 30        01/14/2022    9:59 AM 01/08/2021    9:48 AM 10/07/2019    2:47 PM  6CIT Screen  What Year? 0 points 0 points 0 points  What month? 0 points 0 points 0 points  What time? 0 points 0 points 0 points  Count back from 20 0 points 0 points 0 points  Months in reverse 0 points  0 points 0 points  Repeat phrase 0 points 0 points 0 points  Total Score 0 points 0 points 0 points    Immunizations Immunization History  Administered Date(s) Administered   Fluad Quad(high Dose 65+) 03/30/2020   Influenza, High Dose Seasonal PF 05/09/2016, 05/04/2017, 04/12/2018, 03/30/2020, 04/09/2021   Influenza,inj,Quad PF,6+ Mos 04/05/2019   Influenza-Unspecified 05/01/2015   PFIZER Comirnaty(Gray Top)Covid-19 Tri-Sucrose Vaccine 10/28/2020   PFIZER(Purple Top)SARS-COV-2 Vaccination 08/22/2019, 09/12/2019, 03/30/2020   Pfizer Covid-19 Vaccine Bivalent Booster 107yr & up 04/09/2021, 11/23/2021   Pneumococcal Conjugate-13 10/03/2016   Pneumococcal Polysaccharide-23 01/27/2011, 09/21/2017   Tdap 01/27/2011   Zoster Recombinat (Shingrix) 06/25/2018, 09/06/2018   Zoster, Live 01/19/2010      Flu Vaccine status: Up to date  Pneumococcal vaccine status: Up to date  Covid-19 vaccine status: Completed vaccines  Qualifies for Shingles  Vaccine? Yes   Zostavax completed Yes   Shingrix Completed?: Yes  Screening Tests Health Maintenance  Topic Date Due   INFLUENZA VACCINE  03/01/2022   HEMOGLOBIN A1C  04/30/2022   OPHTHALMOLOGY EXAM  06/30/2022   FOOT EXAM  10/30/2022   COLONOSCOPY (Pts 45-56yr Insurance coverage will need to be confirmed)  06/19/2030   Pneumonia Vaccine 76 Years old  Completed   COVID-19 Vaccine  Completed   Hepatitis C Screening  Completed   Zoster Vaccines- Shingrix  Completed   HPV VACCINES  Aged Out   TETANUS/TDAP  Discontinued    Health Maintenance  There are no preventive care reminders to display for this patient.  Colorectal cancer screening: Type of screening: Colonoscopy. Completed 06/19/20. Repeat every 10 years   Additional Screening:  Hepatitis C Screening:  Completed 02/23/18  Vision Screening: Recommended annual ophthalmology exams for early detection of glaucoma and other disorders of the eye. Is the patient up to date with  their annual eye exam?  Yes  Who is the provider or what is the name of the office in which the patient attends annual eye exams? [facility]  If pt is not established with a provider, would they like to be referred to a provider to establish care? No .   Dental Screening: Recommended annual dental exams for proper oral hygiene  Community Resource Referral / Chronic Care Management: CRR required this visit?  No   CCM required this visit?  No      Plan:     I have personally reviewed and noted the following in the patient's chart:   Medical and social history Use of alcohol, tobacco or illicit drugs  Current medications and supplements including opioid prescriptions. Patient is not currently taking opioid prescriptions. Functional ability and status Nutritional status Physical activity Advanced directives List of other physicians Hospitalizations, surgeries, and ER visits in previous 12 months Vitals Screenings to include cognitive, depression, and falls Referrals and appointments  In addition, I have reviewed and discussed with patient certain preventive protocols, quality metrics, and best practice recommendations. A written personalized care plan for preventive services as well as general preventive health recommendations were provided to patient.     TWillette Brace LPN   [id number]  Nurse Notes: None

## 2022-01-26 ENCOUNTER — Ambulatory Visit (INDEPENDENT_AMBULATORY_CARE_PROVIDER_SITE_OTHER): Payer: PPO

## 2022-01-26 DIAGNOSIS — E538 Deficiency of other specified B group vitamins: Secondary | ICD-10-CM

## 2022-01-26 MED ORDER — CYANOCOBALAMIN 1000 MCG/ML IJ SOLN: INTRAMUSCULAR | 15 refills | Status: DC

## 2022-01-26 MED ORDER — CYANOCOBALAMIN 1000 MCG/ML IJ SOLN
1000.0000 ug | Freq: Once | INTRAMUSCULAR | Status: AC
  Administered 2022-01-26: 1000 ug via INTRAMUSCULAR

## 2022-01-26 NOTE — Progress Notes (Signed)
Pt tolerated b12 well. 

## 2022-02-08 ENCOUNTER — Telehealth: Payer: Self-pay | Admitting: Pharmacist

## 2022-02-08 NOTE — Progress Notes (Signed)
Chronic Care Management Pharmacy Assistant   Name: Tramon Crescenzo  MRN: [id number] DOB: [birth year]   Reason for Encounter: Diabetes Adherence Call    Recent office visits:  10/29/2021 [facility] (PCP) [name], MD; no medication changes indicated.  Recent consult visits:  None  Hospital visits:  None in previous 6 months  Medications: Outpatient Encounter Medications as of 02/08/2022  Medication Sig   Alpha-Lipoic Acid 600 MG TABS Take by mouth.   Ascorbic Acid (VITAMIN C PO) Take by mouth.   Blood Glucose Monitoring Suppl (FREESTYLE LITE) w/Device KIT 1 each by Does not apply route 2 (two) times daily as needed.   celecoxib (CELEBREX) 100 MG capsule Take 100 mg by mouth 2 (two) times daily.   cholecalciferol (VITAMIN D) 1000 units tablet Take 1,000 Units by mouth daily.   Coenzyme Q10 (COQ10) 100 MG CAPS Take 1 capsule by mouth daily.   Cyanocobalamin (VITAMIN B-12 PO) Take by mouth daily.   cyclobenzaprine (FLEXERIL) 5 MG tablet Take 5 mg by mouth at bedtime as needed.   fenofibrate micronized (LOFIBRA) 134 MG capsule TAKE 1 CAPSULE BY MOUTH DAILY BEFORE BREAKFAST.   Ferrous Sulfate (IRON PO) Take by mouth.   gabapentin (NEURONTIN) 600 MG tablet Take 1.5 tablets (900 mg total) by mouth 2 (two) times daily.   glucose blood (FREESTYLE LITE) test strip Use as instructed   Lancets (FREESTYLE) lancets USE AS DIRECTED 2 TIMES DAILY   lisinopril (ZESTRIL) 20 MG tablet TAKE 1 TABLET BY MOUTH EVERY DAY   Magnesium Oxide 500 MG TABS Take by mouth.   mesalamine (LIALDA) 1.2 g EC tablet TAKE 2 TABLETS BY MOUTH DAILY WITH BREAKFAST.   metFORMIN (GLUCOPHAGE-XR) 500 MG 24 hr tablet Take 4 tablets (2,000 mg total) by mouth daily.   simvastatin (ZOCOR) 40 MG tablet TAKE 1 TABLET BY MOUTH EVERYDAY AT BEDTIME   tiZANidine (ZANAFLEX) 2 MG tablet TAKE 1 TABLET BY MOUTH EVERY 8 HOURS AS NEEDED FOR UP TO 30 DAYS.   valACYclovir (VALTREX) 500 MG tablet Take 1 tablet (500 mg total) by mouth daily as  needed (outbreak).   No facility-administered encounter medications on file as of 02/08/2022.   Recent Relevant Labs: Lab Results  Component Value Date/Time   HGBA1C 6.4 10/29/2021 11:52 AM   HGBA1C 6.2 (A) 06/14/2021 03:06 PM   HGBA1C 7.1 (A) 01/26/2021 08:41 AM   HGBA1C 5.9 08/30/2017 12:00 AM   MICROALBUR <0.7 10/29/2021 11:52 AM   MICROALBUR <0.7 10/07/2019 08:53 AM    Kidney Function Lab Results  Component Value Date/Time   CREATININE 1.14 10/29/2021 11:52 AM   CREATININE 1.21 10/26/2020 08:31 AM   GFR 63.91 10/29/2021 11:52 AM    Current antihyperglycemic regimen:  Metformin 500 mg (4 tablets daily)  What recent interventions/DTPs have been made to improve glycemic control:  No recent interventions or DTPs.  Have there been any recent hospitalizations or ED visits since last visit with CPP? No  Patient denies hypoglycemic symptoms.  Patient denies hyperglycemic symptoms.  How often are you checking your blood sugar? twice daily  What are your blood sugars ranging?  Fasting: 95 After meals: 150-200  During the week, how often does your blood glucose drop below 70? Never  Are you checking your feet daily/regularly? Yes  Adherence Review: Is the patient currently on a STATIN medication? Yes Is the patient currently on ACE/ARB medication? Yes Does the patient have >5 day gap between last estimated fill dates? No  -Patient states he  is doing well other than neuropathy in his feet seems to be getting worse.  Care Gaps: Medicare Annual Wellness: Completed 01/14/2022 Ophthalmology Exam: Next due on 06/30/2022 Foot Exam: Next due on 10/30/2022 Hemoglobin A1C: 6.4% on 10/29/2021 Colonoscopy: Completed 06/19/2020  Future Appointments  Date Time Provider [place]  02/25/2022  9:00 AM [facility] LAB [facility]  04/04/2022  3:00 PM [facility] CCM PHARMACIST [facility]  05/02/2022 10:30 AM [name], MD [facility]  01/20/2023  8:45 AM [facility] HEALTH  COACH [facility]   Star Rating Drugs: Lisinopril 20 mg last filled 12/18/2021 90 DS Metformin 500 mg last filled 11/17/2021 90 DS Simvastatin 40 mg last filled 12/18/2021 90 DS  April D Calhoun, [place] Pharmacist Assistant [phone]

## 2022-02-25 ENCOUNTER — Other Ambulatory Visit: Payer: PPO

## 2022-02-26 ENCOUNTER — Encounter: Payer: Self-pay | Admitting: Family Medicine

## 2022-02-28 ENCOUNTER — Other Ambulatory Visit (INDEPENDENT_AMBULATORY_CARE_PROVIDER_SITE_OTHER): Payer: PPO

## 2022-02-28 ENCOUNTER — Other Ambulatory Visit: Payer: Self-pay | Admitting: *Deleted

## 2022-02-28 DIAGNOSIS — E538 Deficiency of other specified B group vitamins: Secondary | ICD-10-CM

## 2022-02-28 LAB — VITAMIN B12: Vitamin B-12: 538 pg/mL (ref 211–911)

## 2022-03-02 NOTE — Patient Instructions (Signed)
Below is our plan:  We will increase gabapentin to 600mg  up to four times daily. You can be flexible with dosing. May consider taking 600mg  twice daily and 1200mg  at bedtime since numbness is more bothersome at night.   Please make sure you are staying well hydrated. I recommend 50-60 ounces daily. Well balanced diet and regular exercise encouraged. Consistent sleep schedule with 6-8 hours recommended.   Please continue follow up with care team as directed.   Follow up with in 6 months   You may receive a survey regarding today's visit. I encourage you to leave honest feed back as I do use this information to improve patient care. Thank you for seeing me today!

## 2022-03-02 NOTE — Progress Notes (Unsigned)
No chief complaint on file.   HISTORY OF PRESENT ILLNESS:  03/02/22 ALL:  Shannon Hodge is a 76 y.o. male here today for follow up for worsening diabetic polyneuropathy. He continues gabapentin 981m BID. Amitriptyline was discontinued due to cost.   He is followed by ortho for lumbar radiculopathy.   04/12/21 ALL (Mychart): Shannon Hodge a 76y.o. male here today for follow up for diabetic polyneuropathy and leg cramps. He continues gabapentin 9032mBID and amitriptyline 2534mt bedtime. He reports bilateral foot numbness waxes and wanes, R>L. Worse at night. He is tolerating medications. Compression stockings help. He feels symptoms are stable. He is sleeping a little better.   He is followed by Dr WesLake Bellsth MurWeston Annatho for lumbar radiculopathy. ESI seemed to help, however A1C increased form 6.1 to 7.9. He then decided to try nerve ablations which have been somewhat effective. A1C back down to 7.1.    REVIEW OF SYSTEMS: Out of a complete 14 system review of symptoms, the patient complains only of the following symptoms, and all other reviewed systems are negative.   ALLERGIES: No Known Allergies   HOME MEDICATIONS: Outpatient Medications Prior to Visit  Medication Sig Dispense Refill   Alpha-Lipoic Acid 600 MG TABS Take by mouth.     Ascorbic Acid (VITAMIN C PO) Take by mouth.     Blood Glucose Monitoring Suppl (FREESTYLE LITE) w/Device KIT 1 each by Does not apply route 2 (two) times daily as needed. 1 kit 0   celecoxib (CELEBREX) 100 MG capsule Take 100 mg by mouth 2 (two) times daily.     cholecalciferol (VITAMIN D) 1000 units tablet Take 1,000 Units by mouth daily.     Coenzyme Q10 (COQ10) 100 MG CAPS Take 1 capsule by mouth daily.     Cyanocobalamin (VITAMIN B-12 PO) Take by mouth daily.     cyclobenzaprine (FLEXERIL) 5 MG tablet Take 5 mg by mouth at bedtime as needed.     fenofibrate micronized (LOFIBRA) 134 MG capsule TAKE 1 CAPSULE BY MOUTH DAILY BEFORE  BREAKFAST. 90 capsule 3   Ferrous Sulfate (IRON PO) Take by mouth.     gabapentin (NEURONTIN) 600 MG tablet Take 1.5 tablets (900 mg total) by mouth 2 (two) times daily. 270 tablet 1   glucose blood (FREESTYLE LITE) test strip Use as instructed 100 each 12   Lancets (FREESTYLE) lancets USE AS DIRECTED 2 TIMES DAILY 100 each 2   lisinopril (ZESTRIL) 20 MG tablet TAKE 1 TABLET BY MOUTH EVERY DAY 90 tablet 3   Magnesium Oxide 500 MG TABS Take by mouth.     mesalamine (LIALDA) 1.2 g EC tablet TAKE 2 TABLETS BY MOUTH DAILY WITH BREAKFAST. 360 tablet 1   metFORMIN (GLUCOPHAGE-XR) 500 MG 24 hr tablet Take 4 tablets (2,000 mg total) by mouth daily. 360 tablet 3   simvastatin (ZOCOR) 40 MG tablet TAKE 1 TABLET BY MOUTH EVERYDAY AT BEDTIME 90 tablet 3   tiZANidine (ZANAFLEX) 2 MG tablet TAKE 1 TABLET BY MOUTH EVERY 8 HOURS AS NEEDED FOR UP TO 30 DAYS.     valACYclovir (VALTREX) 500 MG tablet Take 1 tablet (500 mg total) by mouth daily as needed (outbreak). 30 tablet 2   No facility-administered medications prior to visit.     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 Seeing [facility]   Neuropathy    Ruptured Achilles tendon due to trauma 10/2016   Ulcerative proctitis ([place])      PAST SURGICAL HISTORY: Past Surgical History:  Procedure Laterality Date   basil cell     basil cell/mole removed from back   COLONOSCOPY WITH PROPOFOL N/A 11/01/2016   Procedure: COLONOSCOPY WITH PROPOFOL;  Surgeon: Garlan Fair, MD;  Location: [facility];  Service: Endoscopy;  Laterality: N/A;   colonscopy     x 2   cortisone injection  04/2021   lower back   ESOPHAGOGASTRODUODENOSCOPY (EGD) WITH  PROPOFOL N/A 11/01/2016   Procedure: ESOPHAGOGASTRODUODENOSCOPY (EGD) WITH PROPOFOL;  Surgeon: Garlan Fair, MD;  Location: [facility];  Service: Endoscopy;  Laterality: N/A;   TONSILLECTOMY       FAMILY HISTORY: Family History  Problem Relation Age of Onset   Pancreatic cancer Mother    Stroke Father    Brain cancer Father    Diabetes Father    Healthy Daughter    Healthy Son    Diabetes Maternal Grandmother    Diabetes Maternal Grandfather    Diabetes Paternal Grandmother    Diabetes Paternal Grandfather    Colon cancer Neg Hx    Colon polyps Neg Hx    Esophageal cancer Neg Hx    Stomach cancer Neg Hx      SOCIAL HISTORY: Social History   Socioeconomic History   Marital status: Married    Spouse name: Diane    Number of children: 3   Years of education: post grad   Highest education level: Not on file  Occupational History   Occupation: retired English as a second language teacher  Tobacco Use   Smoking status: Never   Smokeless tobacco: Never  Vaping Use   Vaping Use: Never used  Substance and Sexual Activity   Alcohol use: No   Drug use: No   Sexual activity: Not Currently  Other Topics Concern   Not on file  Social History Narrative   Lives with wife    Drinks 2 cups of coffee a day   Social Determinants of Health   Financial Resource Strain: Low Risk  (01/14/2022)   Overall Financial Resource Strain (CARDIA)    Difficulty of Paying Living Expenses: Not hard at all  Food Insecurity: No Food Insecurity (01/14/2022)   Hunger Vital Sign    Worried About Running Out of Food in the Last Year: Never true    Ran Out of Food in the Last Year: Never true  Transportation Needs: No Transportation Needs (01/14/2022)   PRAPARE - Hydrologist (Medical): No    Lack of Transportation (Non-Medical): No  Physical Activity: Sufficiently Active (01/14/2022)   Exercise Vital Sign    Days of Exercise per Week: 5 days    Minutes of Exercise per Session: 30 min   Stress: No Stress Concern Present (01/14/2022)   [place]    Feeling of Stress : Only a little  Social Connections: Moderately Isolated (01/14/2022)   Social Connection and Isolation Panel [NHANES]    Frequency of Communication with Friends and Family: More than three times a week    Frequency of Social Gatherings with  Friends and Family: More than three times a week    Attends Religious Services: Never    Marine scientist or Organizations: No    Attends Archivist Meetings: Never    Marital Status: Married  Human resources officer Violence: Not At Risk (01/14/2022)   Humiliation, Afraid, Rape, and Kick questionnaire    Fear of Current or Ex-Partner: No    Emotionally Abused: No    Physically Abused: No    Sexually Abused: No     PHYSICAL EXAM  There were no vitals filed for this visit. There is no height or weight on file to calculate BMI.  Generalized: Well developed, in no acute distress  Cardiology: normal rate and rhythm, no murmur auscultated  Respiratory: clear to auscultation bilaterally    Neurological examination  Mentation: Alert oriented to time, place, history taking. Follows all commands speech and language fluent Cranial nerve II-XII: Pupils were equal round reactive to light. Extraocular movements were full, visual field were full on confrontational test. Facial sensation and strength were normal. Uvula tongue midline. Head turning and shoulder shrug  were normal and symmetric. Motor: The motor testing reveals 5 over 5 strength of all 4 extremities. Good symmetric motor tone is noted throughout.  Sensory: Sensory testing is intact to soft touch on all 4 extremities. No evidence of extinction is noted.  Coordination: Cerebellar testing reveals good finger-nose-finger and heel-to-shin bilaterally.  Gait and station: Gait is normal. Tandem gait is normal. Romberg is negative. No drift is seen.   Reflexes: Deep tendon reflexes are symmetric and normal bilaterally.    DIAGNOSTIC DATA (LABS, IMAGING, TESTING) - I reviewed patient records, labs, notes, testing and imaging myself where available.  Lab Results  Component Value Date   WBC 4.5 10/29/2021   HGB 12.0 (L) 10/29/2021   HCT 34.5 (L) 10/29/2021   MCV 93.5 10/29/2021   PLT 158.0 10/29/2021      Component Value Date/Time   NA 141 10/29/2021 1152   NA 142 08/30/2017 0000   K 3.9 10/29/2021 1152   CL 104 10/29/2021 1152   CO2 32 10/29/2021 1152   GLUCOSE 84 10/29/2021 1152   BUN 23 10/29/2021 1152   BUN 19 08/30/2017 0000   CREATININE 1.14 10/29/2021 1152   CALCIUM 9.6 10/29/2021 1152   PROT 6.6 10/29/2021 1152   ALBUMIN 4.5 10/29/2021 1152   AST 17 10/29/2021 1152   ALT 18 10/29/2021 1152   ALKPHOS 40 10/29/2021 1152   BILITOT 0.7 10/29/2021 1152   Lab Results  Component Value Date   CHOL 110 10/29/2021   HDL 43.00 10/29/2021   LDLCALC 50 10/29/2021   TRIG 84.0 10/29/2021   CHOLHDL 3 10/29/2021   Lab Results  Component Value Date   HGBA1C 6.4 10/29/2021   Lab Results  Component Value Date   VITAMINB12 538 02/28/2022   Lab Results  Component Value Date   TSH 1.16 10/07/2019       09/05/2018    9:40 AM  MMSE - Mini Mental State Exam  Orientation to time 5  Orientation to Place 5  Registration 3  Attention/ Calculation 5  Recall 3  Language- name 2 objects 2  Language- repeat 1  Language- follow 3 step command 3  Language- read & follow direction 1  Write a sentence 1  Copy design 1  Total score 30         No data to display  ASSESSMENT AND PLAN  76 y.o. year old male  has a past medical history of Basal cell carcinoma, Cataract, Diabetes ([place]), Diabetic neuropathy ([place]), DJD (degenerative joint disease), Hypercholesteremia, Hyperlipidemia, Hypertension, Inflammatory bowel disease (10/26/2020), Neuropathy, Ruptured Achilles tendon due to trauma (10/2016), and Ulcerative  proctitis ([place]). here with    No diagnosis found.  Coralyn Helling ***.  Healthy lifestyle habits encouraged. *** will follow up with PCP as directed. *** will return to see me in ***, sooner if needed. *** verbalizes understanding and agreement with this plan.   No orders of the defined types were placed in this encounter.    No orders of the defined types were placed in this encounter.    Debbora Presto, MSN, FNP-C 03/02/2022, 4:14 PM  [facility] [address] [place], [place] [address] [phone]

## 2022-03-03 ENCOUNTER — Ambulatory Visit: Payer: PPO | Admitting: Family Medicine

## 2022-03-03 ENCOUNTER — Encounter: Payer: Self-pay | Admitting: Family Medicine

## 2022-03-03 VITALS — BP 108/59 | HR 58 | Ht 72.0 in | Wt 163.0 lb

## 2022-03-03 DIAGNOSIS — M5416 Radiculopathy, lumbar region: Secondary | ICD-10-CM

## 2022-03-03 DIAGNOSIS — E1142 Type 2 diabetes mellitus with diabetic polyneuropathy: Secondary | ICD-10-CM | POA: Diagnosis not present

## 2022-03-03 MED ORDER — GABAPENTIN 600 MG PO TABS: 600.0000 mg | ORAL_TABLET | Freq: Four times a day (QID) | ORAL | 1 refills | Status: DC | PRN

## 2022-03-15 DIAGNOSIS — M545 Low back pain, unspecified: Secondary | ICD-10-CM | POA: Diagnosis not present

## 2022-03-24 ENCOUNTER — Encounter: Payer: Self-pay | Admitting: Family Medicine

## 2022-03-30 DIAGNOSIS — M47816 Spondylosis without myelopathy or radiculopathy, lumbar region: Secondary | ICD-10-CM | POA: Diagnosis not present

## 2022-04-04 ENCOUNTER — Telehealth: Payer: PPO

## 2022-04-05 NOTE — Progress Notes (Signed)
Chronic Care Management Pharmacy Note Summary: Patient doing well with A1c and lipids on current med regimen.  He is still having some issues with neuropathy.  Suggested Cymbalta but he has tried and failed.  Could consider Lyrica if higher doses of gabapentin are not working.  FU 6 months  04/08/2022 Name:  Shannon Hodge MRN:  [id number] DOB:  1946-05-25  Subjective: Shannon Hodge is an 76 y.o. year old male who is a primary patient of [name], MD.  The CCM team was consulted for assistance with disease management and care coordination needs.    Engaged with patient by telephone for follow up visit in response to provider referral for pharmacy case management and/or care coordination services.   Consent to Services:  The patient was given the following information about Chronic Care Management services today, agreed to services, and gave verbal consent: 1. CCM service includes personalized support from designated clinical staff supervised by the primary care provider, including individualized plan of care and coordination with other care providers 2. 24/7 contact phone numbers for assistance for urgent and routine care needs. 3. Service will only be billed when office clinical staff spend 20 minutes or more in a month to coordinate care. 4. Only one practitioner may furnish and bill the service in a calendar month. 5.The patient may stop CCM services at any time (effective at the end of the month) by phone call to the office staff. 6. The patient will be responsible for cost sharing (co-pay) of up to 20% of the service fee (after annual deductible is met). Patient agreed to services and consent obtained.  Patient Care Team: [name], MD as PCP - General (Family Medicine) Penni Bombard, MD as Consulting Physician (Neurology) Garlan Fair, MD as Consulting Physician (Gastroenterology) Irine Seal, MD as Attending Physician (Urology) Marchia Bond, MD as Consulting  Physician (Orthopedic Surgery) Debbora Presto, NP as Nurse Practitioner (Neurology) Otelia Sergeant, OD as Consulting Physician (Optometry) Thornton Park, MD as Consulting Physician (Gastroenterology) Edythe Clarity, [facility] (Pharmacist)  Recent office visits:  10/29/2021 OV (PCP) [name], MD; no medication changes indicated.   Recent consult visits:  None   Hospital visits:  None in previous 6 months  Objective:  Lab Results  Component Value Date   CREATININE 1.14 10/29/2021   CREATININE 1.21 10/26/2020   CREATININE 1.17 10/07/2019    Lab Results  Component Value Date   HGBA1C 6.4 10/29/2021   Last diabetic Eye exam:  Lab Results  Component Value Date/Time   HMDIABEYEEXA No Retinopathy 06/30/2021 12:00 AM    Last diabetic Foot exam: No results found for: "HMDIABFOOTEX"      Component Value Date/Time   CHOL 110 10/29/2021 1152   TRIG 84.0 10/29/2021 1152   HDL 43.00 10/29/2021 1152   CHOLHDL 3 10/29/2021 1152   VLDL 16.8 10/29/2021 1152   LDLCALC 50 10/29/2021 1152       Latest Ref Rng & Units 10/29/2021   11:52 AM 10/26/2020    8:31 AM 10/07/2019    8:53 AM  Hepatic Function  Total Protein 6.0 - 8.3 g/dL 6.6  6.7  6.8   Albumin 3.5 - 5.2 g/dL 4.5  4.8  4.5   AST 0 - 37 U/L _0 ALT 0 - 53 U/L _1 Alk Phosphatase 39 - 117 U/L 40  49  42   Total Bilirubin 0.2 - 1.2  mg/dL 0.7  0.8  0.9     Lab Results  Component Value Date/Time   TSH 1.16 10/07/2019 08:53 AM   TSH 1.42 03/27/2018 08:33 AM       Latest Ref Rng & Units 10/29/2021   11:52 AM 10/26/2020    8:31 AM 04/14/2020    9:29 AM  CBC  WBC 4.0 - 10.5 K/uL 4.5  4.7  4.5   Hemoglobin 13.0 - 17.0 g/dL 12.0  12.7  12.8   Hematocrit 39.0 - 52.0 % 34.5  37.5  38.2   Platelets 150.0 - 400.0 K/uL 158.0  193.0  177     No results found for: "VD25OH"  Clinical ASCVD:  The ASCVD Risk score (Arnett DK, et al., 2019) failed to calculate for the following reasons:   The valid total  cholesterol range is 130 to 320 mg/dL    Social History   Tobacco Use  Smoking Status Never  Smokeless Tobacco Never   BP Readings from Last 3 Encounters:  03/03/22 (!) 108/59  01/14/22 104/68  10/29/21 106/67   Pulse Readings from Last 3 Encounters:  03/03/22 (!) 58  01/14/22 65  10/29/21 60   Wt Readings from Last 3 Encounters:  03/03/22 163 lb (73.9 kg)  01/14/22 165 lb 3.2 oz (74.9 kg)  10/29/21 166 lb 3.2 oz (75.4 kg)    Assessment: Review of patient past medical history, allergies, medications, health status, including review of consultants reports, laboratory and other test data, was performed as part of comprehensive evaluation and provision of chronic care management services.   SDOH:  (Social Determinants of Health) assessments and interventions performed: No, done this year  Financial Resource Strain: Low Risk  (01/14/2022)   Overall Financial Resource Strain (CARDIA)    Difficulty of Paying Living Expenses: Not hard at all    [place]  No Known Allergies  Medications Reviewed Today     Reviewed by Edythe Clarity, [facility] (Pharmacist) on 04/08/22 at 1408  Med List Status: <None>   Medication Order Taking? Sig Documenting Provider Last Dose Status Informant  Alpha-Lipoic Acid 600 MG TABS [id number] Yes Take by mouth. [provider] Taking Active   Ascorbic Acid (VITAMIN C PO) [id number] Yes Take by mouth. [provider] Taking Active   Blood Glucose Monitoring Suppl (FREESTYLE LITE) w/Device KIT [id number] Yes 1 each by Does not apply route 2 (two) times daily as needed. [name], MD Taking Active   celecoxib (CELEBREX) 100 MG capsule [id number] Yes Take 100 mg by mouth 2 (two) times daily. [provider] Taking Active   cholecalciferol (VITAMIN D) 1000 units tablet [id number] Yes Take 1,000 Units by mouth daily. [provider] Taking Active   Coenzyme Q10 (COQ10) 100 MG CAPS [id number] Yes Take 1 capsule by mouth  daily. [provider] Taking Active   Cyanocobalamin (VITAMIN B-12 PO) [id number] Yes Take by mouth daily. [provider] Taking Active   cyclobenzaprine (FLEXERIL) 5 MG tablet [id number] Yes Take 5 mg by mouth at bedtime as needed. [provider] Taking Active   fenofibrate micronized (LOFIBRA) 134 MG capsule [id number] Yes TAKE 1 CAPSULE BY MOUTH DAILY BEFORE BREAKFAST. [name], MD Taking Active   Ferrous Sulfate (IRON PO) [id number] Yes Take by mouth. [provider] Taking Active   gabapentin (NEURONTIN) 600 MG tablet [id number] Yes Take 1 tablet (600 mg total) by mouth 4 (four) times daily as needed. Debbora Presto, NP Taking Active   glucose  blood (FREESTYLE LITE) test strip [id number] Yes Use as instructed [name], MD Taking Active   Lancets (FREESTYLE) lancets [id number] Yes USE AS DIRECTED 2 TIMES DAILY [name], MD Taking Active   lisinopril (ZESTRIL) 20 MG tablet [id number] Yes TAKE 1 TABLET BY MOUTH EVERY DAY [name], MD Taking Active   Magnesium Oxide 500 MG TABS [id number] Yes Take by mouth. [provider] Taking Active   mesalamine (LIALDA) 1.2 g EC tablet [id number] Yes TAKE 2 TABLETS BY MOUTH DAILY WITH BREAKFAST. [name], MD Taking Active   metFORMIN (GLUCOPHAGE-XR) 500 MG 24 hr tablet [id number] Yes Take 4 tablets (2,000 mg total) by mouth daily. [name], MD Taking Active   simvastatin (ZOCOR) 40 MG tablet [id number] Yes TAKE 1 TABLET BY MOUTH EVERYDAY AT BEDTIME [name], MD Taking Active   tiZANidine (ZANAFLEX) 2 MG tablet [id number] Yes TAKE 1 TABLET BY MOUTH EVERY 8 HOURS AS NEEDED FOR UP TO 30 DAYS. [provider] Taking Active   valACYclovir (VALTREX) 500 MG tablet [id number] Yes Take 1 tablet (500 mg total) by mouth daily as needed (outbreak). [name], MD Taking Active             Patient Active Problem List   Diagnosis Date Noted   Inflammatory bowel disease  10/26/2020   Essential hypertension 04/01/2020   Achilles rupture, right 04/01/2020   Spinal stenosis of lumbar region 04/01/2020   Diabetic polyneuropathy associated with type 2 diabetes mellitus ([place]) 01/01/2019   Restless leg syndrome 09/21/2017   Mixed hyperlipidemia 09/21/2017   Hearing loss 09/21/2017   Muscle cramp, nocturnal 09/21/2017   Vitamin B12 deficiency 09/21/2017   Foot drop, left 09/21/2017   Controlled type 2 diabetes mellitus with diabetic polyneuropathy, without long-term current use of insulin ([place]) 09/01/2016   Allergic rhinitis 02/22/2016   Genital herpes 02/22/2016   Insomnia 02/22/2016   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   Lumbar radiculopathy 11/04/2014    Immunization History  Administered Date(s) Administered   Fluad Quad(high Dose 65+) 03/30/2020   Influenza, High Dose Seasonal PF 05/09/2016, 05/04/2017, 04/12/2018, 03/30/2020, 04/09/2021   Influenza,inj,Quad PF,6+ Mos 04/05/2019   Influenza-Unspecified 05/01/2015   PFIZER Comirnaty(Gray Top)Covid-19 Tri-Sucrose Vaccine 10/28/2020   PFIZER(Purple Top)SARS-COV-2 Vaccination 08/22/2019, 09/12/2019, 03/30/2020   Pfizer Covid-19 Vaccine Bivalent Booster 13yr & up 04/09/2021, 11/23/2021   Pneumococcal Conjugate-13 10/03/2016   Pneumococcal Polysaccharide-23 01/27/2011, 09/21/2017   Tdap 01/27/2011   Zoster Recombinat (Shingrix) 06/25/2018, 09/06/2018   Zoster, Live 01/19/2010   Conditions to be addressed/monitored: DMII, Insomnia, inflammatory bowel disease, b12 deficiency, HLD, RLS, low testosterone, HTN  Care Plan : [place] Updates made by DEdythe Clarity RPH since 04/08/2022 12:00 AM     Problem: DMII, Insomnia, inflammatory bowel disease, b12 deficiency, HLD, RLS, low testosterone, HTN   Priority: High     Long-Range Goal: Disease Management   Start Date: 12/02/2020  Expected End Date: 12/02/2021  Recent Progress: On track  Priority: High  Note:   Current  Barriers:  HLD control,  Pharmacist Clinical Goal(s):  Patient will contact provider office for questions/concerns as evidenced notation of same in electronic health record through collaboration with PharmD and provider.   Interventions: 1:1 collaboration with ALeamon Arnt MD regarding development and update of comprehensive plan of care as evidenced by provider attestation and co-signature Inter-disciplinary care team collaboration (see longitudinal plan of care) Comprehensive medication review performed; medication list updated in  electronic medical record No medication changes  Hypertension (BP goal <130/80) -Controlled. Consistently at goal during OVs -Current treatment: Lisinopril 20 mg once daily  -Denies hypotensive/hypertensive symptoms -Counseled to monitor BP at home as directed, document, and provide log at future appointments -Recommended to continue current medication  Hyperlipidemia: (LDL goal < 70) 04/08/22 Controlled, most recent LDL was 50 -Simvastatin increased to 40 mg following 09/2020 OV -Current treatment: Simvastatin 40 mg once daily Appropriate, Effective, Safe, Accessible Fenofibrate 134 mg once daily Appropriate, Effective, Safe, Accessible -Educated on Cholesterol goals;  -Recommended to continue current medication Recent lipid panel was first since dose increase, LDL improved greatly.  He is tolerating medication find and shows adherence. No changes - continue same dose and routine screenings  Update 02/22/21 No updated lipids since last visit. Increased dose of simvastatin should get patient to LDL < 70. Recommend repeat lipid panel at next OV. Reinforced adherence, patient takes daily with no concerns.  Update 08/31/21 Still no updated lipids since increased dose Repeat lipid panel at next OV Tolerating increased dose fine. No changes to meds until results.  Diabetes (A1c goal <7%) -Controlled, based on most recent A1c of 6.4!! -Current  medications: Metformin XR 500 mg tab - 4 tabs (2000 mg) once daily with dinner Appropriate, Effective, Safe, Accessible -Medications previously tried: no ol-ther medications per med hx.   -Current home glucose readings - 85-105 fasting glucose -Denies hypoglycemic/hyperglycemic symptoms -Current meal patterns:  same --> Patient follows a special diet due to BS, he eats whole wheat, tofu snacks, hummus, veggies, no fried foods.  His diet consists of salt oriented foods as that is his preference rather than sugar.   -Current exercise: He has cut back on his cardio to hope that it helps with pain some  -Educated on A1c and blood sugar goals; -Counseled to check feet daily and get yearly eye exams -He has visit scheduled in October, recommend recheck A1c at this time. No changes to meds  Update 02/22/21 A1c down to 7.1 at last OV from 7.9%! Patient now in process of ablation for pain treatments which are non-steroidal and should help avoid spikles in A1c. Continues to be adherent with medication.  No need for additional treatment at this time will continue to monitor. Continue current meds  Update 08/31/21 A1c has decreased all the way to 6.2!! FBG still runs around 100. After diet it runs to 200 sometimes. Working to cut back on carbs. Denies any hypoglycemia < 70.   Insomnia (Goal: ensure sleep hygiene) -Has somewhat improved with recent medications for pain. Is content with amount of sleep, feels rested enough throughout the day -Current treatment  N/A -Medications previously tried: zolpidem ~84m once daily, no OTCs -Counseled on sleep hygiene, could consider otc melatonin sleep aid  Patient Goals/Self-Care Activities Patient will:  - target a minimum of 150 minutes of moderate intensity exercise weekly  Follow Up Plan: RMerrickf/u 2 months  Medication Assistance: None required.  Patient affirms current coverage meets needs.  Patient's preferred pharmacy is:  [id number]  [place], [place] - [address] [place] [id number]: [phone]: [phone] [facility]- NoLecomptonOHL[place]hone: [phone]: [phone] Up:  Patient agrees to Care Plan and Follow-up.        Compliance/Adherence/Medication fill history: Care  Gaps: None - does need Flu vaccine  Star-Rating Drugs: Metformin ER 539m 11/17/21 90ds - patient has some on hand Simvastatin 444m05/20/23 - has some on hand  Future Appointments  Date Time Provider [place]10/09/2021 10:30 AM AnLeamon ArntMD [facility]2/13/2024  3:30 PM Penumalli, ViEarlean PolkaMD [facility] None  01/20/2023  8:45 AM [facility] HEALTH COACH [facility] PENorwichPharmD Clinical Pharmacist  [facility] [phone]

## 2022-04-08 ENCOUNTER — Ambulatory Visit: Payer: PPO | Admitting: Pharmacist

## 2022-04-08 DIAGNOSIS — E782 Mixed hyperlipidemia: Secondary | ICD-10-CM

## 2022-04-08 DIAGNOSIS — E1142 Type 2 diabetes mellitus with diabetic polyneuropathy: Secondary | ICD-10-CM

## 2022-04-08 NOTE — Patient Instructions (Addendum)
Visit Information   Goals Addressed             This Visit's Progress    Monitor and Manage My Blood Sugar-Diabetes Type 2   On track    Timeframe:  Long-Range Goal Priority:  High Start Date: 02/22/21                            Expected End Date:  08/25/21                     Follow Up Date 05/31/21    - check blood sugar at prescribed times - check blood sugar before and after exercise - take the blood sugar log to all doctor visits    Why is this important?   Checking your blood sugar at home helps to keep it from getting very high or very low.  Writing the results in a diary or log helps the doctor know how to care for you.  Your blood sugar log should have the time, date and the results.  Also, write down the amount of insulin or other medicine that you take.  Other information, like what you ate, exercise done and how you were feeling, will also be helpful.     Notes:        Patient Care Plan: CCM Pharmacy Care Plan     Problem Identified: DMII, Insomnia, inflammatory bowel disease, b12 deficiency, HLD, RLS, low testosterone, HTN   Priority: High     Long-Range Goal: Disease Management   Start Date: 12/02/2020  Expected End Date: 12/02/2021  Recent Progress: On track  Priority: High  Note:   Current Barriers:  HLD control,  Pharmacist Clinical Goal(s):  Patient will contact provider office for questions/concerns as evidenced notation of same in electronic health record through collaboration with PharmD and provider.   Interventions: 1:1 collaboration with [name], MD regarding development and update of comprehensive plan of care as evidenced by provider attestation and co-signature Inter-disciplinary care team collaboration (see longitudinal plan of care) Comprehensive medication review performed; medication list updated in electronic medical record No medication changes  Hypertension (BP goal <130/80) -Controlled. Consistently at goal during  OVs -Current treatment: Lisinopril 20 mg once daily  -Denies hypotensive/hypertensive symptoms -Counseled to monitor BP at home as directed, document, and provide log at future appointments -Recommended to continue current medication  Hyperlipidemia: (LDL goal < 70) 04/08/22 Controlled, most recent LDL was 50 -Simvastatin increased to 40 mg following 09/2020 OV -Current treatment: Simvastatin 40 mg once daily Appropriate, Effective, Safe, Accessible Fenofibrate 134 mg once daily Appropriate, Effective, Safe, Accessible -Educated on Cholesterol goals;  -Recommended to continue current medication Recent lipid panel was first since dose increase, LDL improved greatly.  He is tolerating medication find and shows adherence. No changes - continue same dose and routine screenings  Update 02/22/21 No updated lipids since last visit. Increased dose of simvastatin should get patient to LDL < 70. Recommend repeat lipid panel at next OV. Reinforced adherence, patient takes daily with no concerns.  Update 08/31/21 Still no updated lipids since increased dose Repeat lipid panel at next OV Tolerating increased dose fine. No changes to meds until results.  Diabetes (A1c goal <7%) -Controlled, based on most recent A1c of 6.4!! -Current medications: Metformin XR 500 mg tab - 4 tabs (2000 mg) once daily with dinner Appropriate, Effective, Safe, Accessible -Medications previously tried: no ol-ther medications per med hx.   -  Current home glucose readings - 85-105 fasting glucose -Denies hypoglycemic/hyperglycemic symptoms -Current meal patterns:  same --> Patient follows a special diet due to BS, he eats whole wheat, tofu snacks, hummus, veggies, no fried foods.  His diet consists of salt oriented foods as that is his preference rather than sugar.   -Current exercise: He has cut back on his cardio to hope that it helps with pain some  -Educated on A1c and blood sugar goals; -Counseled to check feet  daily and get yearly eye exams -He has visit scheduled in October, recommend recheck A1c at this time. No changes to meds  Update 02/22/21 A1c down to 7.1 at last OV from 7.9%! Patient now in process of ablation for pain treatments which are non-steroidal and should help avoid spikles in A1c. Continues to be adherent with medication.  No need for additional treatment at this time will continue to monitor. Continue current meds  Update 08/31/21 A1c has decreased all the way to 6.2!! FBG still runs around 100. After diet it runs to 200 sometimes. Working to cut back on carbs. Denies any hypoglycemia < 70.   Insomnia (Goal: ensure sleep hygiene) -Has somewhat improved with recent medications for pain. Is content with amount of sleep, feels rested enough throughout the day -Current treatment  N/A -Medications previously tried: zolpidem ~45m once daily, no OTCs -Counseled on sleep hygiene, could consider otc melatonin sleep aid  Patient Goals/Self-Care Activities Patient will:  - target a minimum of 150 minutes of moderate intensity exercise weekly  Follow Up Plan: RCarlinf/u 2 months  Medication Assistance: None required.  Patient affirms current coverage meets needs.  Patient's preferred pharmacy is:  [id number] [place], [place] - [address] [place]  [id number]: [phone]: [phone] Hospital Of [facility]pl[facility] LLC- NoNorth Ballston SpaOH[place]cord8[place] ParkH[place]aho4[address]hone: [phone]: [phone] Up:  Patient agrees to Care Plan and Follow-up.         The patient verbalized understanding of instructions, educational materials, and care plan provided today and DECLINED offer to receive copy of patient instructions, educational materials, and care plan.  Telephone follow up appointment with pharmacy team member scheduled for: 6  months  ChEdythe ClarityRPHazel DellPharmD Clinical Pharmacist  [phone]

## 2022-04-15 DIAGNOSIS — M47816 Spondylosis without myelopathy or radiculopathy, lumbar region: Secondary | ICD-10-CM | POA: Diagnosis not present

## 2022-04-20 ENCOUNTER — Other Ambulatory Visit: Payer: Self-pay | Admitting: Family Medicine

## 2022-04-21 ENCOUNTER — Other Ambulatory Visit: Payer: Self-pay | Admitting: Family Medicine

## 2022-04-25 ENCOUNTER — Encounter: Payer: Self-pay | Admitting: *Deleted

## 2022-05-02 ENCOUNTER — Encounter: Payer: Self-pay | Admitting: Family Medicine

## 2022-05-02 ENCOUNTER — Ambulatory Visit (INDEPENDENT_AMBULATORY_CARE_PROVIDER_SITE_OTHER): Payer: PPO | Admitting: Family Medicine

## 2022-05-02 VITALS — BP 100/50 | HR 60 | Temp 98.3°F | Ht 72.0 in | Wt 160.0 lb

## 2022-05-02 DIAGNOSIS — G2581 Restless legs syndrome: Secondary | ICD-10-CM | POA: Diagnosis not present

## 2022-05-02 DIAGNOSIS — E1142 Type 2 diabetes mellitus with diabetic polyneuropathy: Secondary | ICD-10-CM

## 2022-05-02 DIAGNOSIS — E538 Deficiency of other specified B group vitamins: Secondary | ICD-10-CM

## 2022-05-02 DIAGNOSIS — I10 Essential (primary) hypertension: Secondary | ICD-10-CM | POA: Diagnosis not present

## 2022-05-02 DIAGNOSIS — D649 Anemia, unspecified: Secondary | ICD-10-CM

## 2022-05-02 DIAGNOSIS — K6289 Other specified diseases of anus and rectum: Secondary | ICD-10-CM

## 2022-05-02 LAB — CBC WITH DIFFERENTIAL/PLATELET
Basophils Absolute: 0 10*3/uL (ref 0.0–0.1)
Basophils Relative: 0.6 % (ref 0.0–3.0)
Eosinophils Absolute: 0.2 10*3/uL (ref 0.0–0.7)
Eosinophils Relative: 5.9 % — ABNORMAL HIGH (ref 0.0–5.0)
HCT: 37.1 % — ABNORMAL LOW (ref 39.0–52.0)
Hemoglobin: 12.7 g/dL — ABNORMAL LOW (ref 13.0–17.0)
Lymphocytes Relative: 20.9 % (ref 12.0–46.0)
Lymphs Abs: 0.9 10*3/uL (ref 0.7–4.0)
MCHC: 34.4 g/dL (ref 30.0–36.0)
MCV: 94.8 fl (ref 78.0–100.0)
Monocytes Absolute: 0.4 10*3/uL (ref 0.1–1.0)
Monocytes Relative: 9 % (ref 3.0–12.0)
Neutro Abs: 2.6 10*3/uL (ref 1.4–7.7)
Neutrophils Relative %: 63.6 % (ref 43.0–77.0)
Platelets: 168 10*3/uL (ref 150.0–400.0)
RBC: 3.91 Mil/uL — ABNORMAL LOW (ref 4.22–5.81)
RDW: 12.7 % (ref 11.5–15.5)
WBC: 4.2 10*3/uL (ref 4.0–10.5)

## 2022-05-02 LAB — POCT GLYCOSYLATED HEMOGLOBIN (HGB A1C): Hemoglobin A1C: 5.9 % — AB (ref 4.0–5.6)

## 2022-05-02 LAB — VITAMIN B12: Vitamin B-12: 560 pg/mL (ref 211–911)

## 2022-05-02 MED ORDER — FREESTYLE LITE TEST VI STRP: ORAL_STRIP | 12 refills | Status: DC

## 2022-05-02 MED ORDER — LISINOPRIL 10 MG PO TABS: 10.0000 mg | ORAL_TABLET | Freq: Every day | ORAL | 3 refills | Status: DC

## 2022-05-02 MED ORDER — METFORMIN HCL ER 500 MG PO TB24: 1500.0000 mg | ORAL_TABLET | Freq: Every day | ORAL | 3 refills | Status: DC

## 2022-05-02 NOTE — Patient Instructions (Signed)
Please return in 3 months for diabetes and blood pressure follow up   I will release your lab results to you on your MyChart account with further instructions. You may see the results before I do, but when I review them I will send you a message with my report or have my assistant call you if things need to be discussed. Please reply to my message with any questions. Thank you!   Please monitor your caloric intake. You have lost weight; want calories in to be >2000 if needed to maintain your weight.   If you have any questions or concerns, please don't hesitate to send me a message via MyChart or call the office at [phone]. Thank you for visiting with [place] today! It's our pleasure caring for you.

## 2022-05-02 NOTE — Progress Notes (Signed)
Subjective  CC:  Chief Complaint  Patient presents with   Diabetes    Pt here for F/U with DM    HPI: Shannon Hodge is a 76 y.o. male who presents to the office today for follow up of diabetes and problems listed above in the chief complaint.  Diabetes follow up: His diabetic control is reported as Improved. Home readings remain < 120. Reports eats well but weight continues to slowly decline. Avoids carbs.  He denies exertional CP or SOB or symptomatic hypoglycemia. He denies foot sores seeing neuro for neuropathy; on gabapentin. Anemia and b12 deficiency and mild iron deficiency: now on IM injections and oral supplements. Energy has improved. No melena Proctitis; meds have improved his diarrhea Bp is running low but he feels well. No lightheadedness or palpitations. On lisinopril 20 daily.  Wt Readings from Last 3 Encounters:  05/02/22 160 lb (72.6 kg)  03/03/22 163 lb (73.9 kg)  01/14/22 165 lb 3.2 oz (74.9 kg)    BP Readings from Last 3 Encounters:  05/02/22 (!) 100/50  03/03/22 (!) 108/59  01/14/22 104/68    Assessment  1. Controlled type 2 diabetes mellitus with diabetic polyneuropathy, without long-term current use of insulin ([place])   2. Vitamin B12 deficiency   3. Normochromic anemia   4. Essential hypertension   5. Restless leg syndrome   6. Diabetic polyneuropathy associated with type 2 diabetes mellitus ([place])   7. Proctitis      Plan  Diabetes is currently very well controlled. Decrease metformin to 1500xr daily. Start logging caloric intake. Monitor weight.  Recheck cbc and b12 iron levels.  Bp is too low: decrease lisinopril back down to 10 daily.  continueRLS meds and gabapentin Proctitis is improved. No more rectal bleeding  Follow up: 3 mo to recheck diabets and htn. Orders Placed This Encounter  Procedures   CBC with Differential/Platelet   Vitamin B12   Iron, TIBC and Ferritin Panel   POCT HgB A1C   Meds ordered this encounter  Medications    lisinopril (ZESTRIL) 10 MG tablet    Sig: Take 1 tablet (10 mg total) by mouth daily.    Dispense:  90 tablet    Refill:  3    Decreasing dose from 20   metFORMIN (GLUCOPHAGE-XR) 500 MG 24 hr tablet    Sig: Take 3 tablets (1,500 mg total) by mouth daily.    Dispense:  360 tablet    Refill:  3    Please hold on file for next due refill in January   glucose blood (FREESTYLE LITE) test strip    Sig: USE TO CHECK BLOOD SUGERS 2X DAILY.    Dispense:  100 strip    Refill:  12    NEEDS REFILLS      Immunization History  Administered Date(s) Administered   Fluad Quad(high Dose 65+) 03/30/2020, 04/29/2022   Influenza, High Dose Seasonal PF 05/09/2016, 05/04/2017, 04/12/2018, 03/30/2020, 04/09/2021   Influenza,inj,Quad PF,6+ Mos 04/05/2019   Influenza-Unspecified 05/01/2015   PFIZER Comirnaty(Gray Top)Covid-19 Tri-Sucrose Vaccine 10/28/2020   PFIZER(Purple Top)SARS-COV-2 Vaccination 08/22/2019, 09/12/2019, 03/30/2020   Pfizer Covid-19 Vaccine Bivalent Booster 58yr & up 04/09/2021, 11/23/2021   Pneumococcal Conjugate-13 10/03/2016   Pneumococcal Polysaccharide-23 01/27/2011, 09/21/2017   Tdap 01/27/2011   Zoster Recombinat (Shingrix) 06/25/2018, 09/06/2018   Zoster, Live 01/19/2010    Diabetes Related Lab Review: Lab Results  Component Value Date   HGBA1C 5.9 (A) 05/02/2022   HGBA1C 6.4 10/29/2021   HGBA1C 6.2 (A) 06/14/2021  Lab Results  Component Value Date   MICROALBUR <0.7 10/29/2021   Lab Results  Component Value Date   CREATININE 1.14 10/29/2021   BUN 23 10/29/2021   NA 141 10/29/2021   K 3.9 10/29/2021   CL 104 10/29/2021   CO2 32 10/29/2021   Lab Results  Component Value Date   CHOL 110 10/29/2021   CHOL 152 10/26/2020   CHOL 146 10/07/2019   Lab Results  Component Value Date   HDL 43.00 10/29/2021   HDL 51.30 10/26/2020   HDL 58.20 10/07/2019   Lab Results  Component Value Date   LDLCALC 50 10/29/2021   LDLCALC 76 10/26/2020   LDLCALC 71  10/07/2019   Lab Results  Component Value Date   TRIG 84.0 10/29/2021   TRIG 122.0 10/26/2020   TRIG 86.0 10/07/2019   Lab Results  Component Value Date   CHOLHDL 3 10/29/2021   CHOLHDL 3 10/26/2020   CHOLHDL 3 10/07/2019   No results found for: "LDLDIRECT" The ASCVD Risk score (Arnett DK, et al., 2019) failed to calculate for the following reasons:   The valid total cholesterol range is 130 to 320 mg/dL I have reviewed the PMH, Fam and Soc history. Patient Active Problem List   Diagnosis Date Noted   Inflammatory bowel disease 10/26/2020    Priority: High    Unspecified. Seeing [facility]    Essential hypertension 04/01/2020    Priority: High   Spinal stenosis of lumbar region 04/01/2020    Priority: High    Dr. 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 mellitus ([place]) 01/01/2019    Priority: High   Restless leg syndrome 09/21/2017    Priority: High   Mixed hyperlipidemia 09/21/2017    Priority: High   Controlled type 2 diabetes mellitus with diabetic polyneuropathy, without long-term current use of insulin ([place]) 09/01/2016    Priority: High   Insomnia 02/22/2016    Priority: High   Lumbar radiculopathy 11/04/2014    Priority: High   Hearing loss 09/21/2017    Priority: Medium     Wears hearing aides    Muscle cramp, nocturnal 09/21/2017    Priority: Medium    Foot drop, left 09/21/2017    Priority: Medium    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 He has never used smokeless tobacco. He reports that he does not drink alcohol and does not use drugs.  Review of Systems: Ophthalmic: negative for eye pain, loss of vision or double  vision Cardiovascular: negative for chest pain Respiratory: negative for SOB or persistent cough Gastrointestinal: negative for abdominal pain Genitourinary: negative for dysuria or gross hematuria MSK: negative for foot lesions Neurologic: negative for weakness or gait disturbance  Objective  Vitals: BP (!) 100/50   Pulse 60   Temp 98.3 F (36.8 C)   Ht 6' (1.829 m)   Wt 160 lb (72.6 kg)   SpO2 93%   BMI 21.70 kg/m  General: well appearing, no acute distress , thin Psych:  Alert and oriented, normal mood and affect Cardiovascular:  Nl S1 and S2, RRR without murmur, gallop or rub.  no edema Respiratory:  Good breath sounds bilaterally, CTAB with normal effort, no rales Skin:  Warm, no rashes    Diabetic education: ongoing education regarding chronic disease management for diabetes was given today. We continue to reinforce the ABC's of diabetic management: A1c (<7 or 8 dependent upon patient), tight blood pressure control, and cholesterol management with goal LDL < 100 minimally. We discuss diet strategies, exercise recommendations, medication options and possible side effects. At each visit, we review recommended immunizations and preventive care recommendations for diabetics and stress that good diabetic control can prevent other problems. See below for this patient's data.   Commons side effects, risks, benefits, and alternatives for medications and treatment plan prescribed today were discussed, and the patient expressed understanding of the given instructions. Patient is instructed to call or message via MyChart if he/she has any questions or concerns regarding our treatment plan. No barriers to understanding were identified. We discussed Red Flag symptoms and signs in detail. Patient expressed understanding regarding what to do in case of urgent or emergency type symptoms.  Medication list was reconciled, printed and provided to the patient in AVS. Patient instructions and summary  information was reviewed with the patient as documented in the AVS. This note was prepared with assistance of Dragon voice recognition software. Occasional wrong-word or sound-a-like substitutions may have occurred due to the inherent limitations of voice recognition software

## 2022-05-03 ENCOUNTER — Encounter: Payer: Self-pay | Admitting: Family Medicine

## 2022-05-03 ENCOUNTER — Other Ambulatory Visit: Payer: Self-pay

## 2022-05-03 DIAGNOSIS — E1142 Type 2 diabetes mellitus with diabetic polyneuropathy: Secondary | ICD-10-CM

## 2022-05-03 LAB — IRON,TIBC AND FERRITIN PANEL
%SAT: 37 % (calc) (ref 20–48)
Ferritin: 58 ng/mL (ref 24–380)
Iron: 134 ug/dL (ref 50–180)
TIBC: 364 mcg/dL (calc) (ref 250–425)

## 2022-05-03 MED ORDER — ONETOUCH ULTRASOFT LANCETS MISC: 12 refills | Status: AC

## 2022-05-03 MED ORDER — ONETOUCH ULTRA 2 W/DEVICE KIT: PACK | 0 refills | Status: AC

## 2022-05-03 MED ORDER — ONETOUCH ULTRA VI STRP: ORAL_STRIP | 12 refills | Status: DC

## 2022-05-06 ENCOUNTER — Encounter: Payer: Self-pay | Admitting: Family Medicine

## 2022-05-23 DIAGNOSIS — M47816 Spondylosis without myelopathy or radiculopathy, lumbar region: Secondary | ICD-10-CM | POA: Diagnosis not present

## 2022-06-03 ENCOUNTER — Other Ambulatory Visit: Payer: Self-pay | Admitting: Gastroenterology

## 2022-06-03 DIAGNOSIS — K529 Noninfective gastroenteritis and colitis, unspecified: Secondary | ICD-10-CM

## 2022-06-06 ENCOUNTER — Other Ambulatory Visit: Payer: Self-pay

## 2022-06-06 ENCOUNTER — Telehealth: Payer: Self-pay | Admitting: Family Medicine

## 2022-06-06 DIAGNOSIS — E1142 Type 2 diabetes mellitus with diabetic polyneuropathy: Secondary | ICD-10-CM

## 2022-06-06 DIAGNOSIS — M5416 Radiculopathy, lumbar region: Secondary | ICD-10-CM | POA: Diagnosis not present

## 2022-06-06 MED ORDER — ONETOUCH ULTRA VI STRP: ORAL_STRIP | 12 refills | Status: DC

## 2022-06-06 MED ORDER — FREESTYLE LITE TEST VI STRP: ORAL_STRIP | 12 refills | Status: AC

## 2022-06-06 NOTE — Telephone Encounter (Signed)
[facility] free style glucose tests strips -   Patient requests office to get [facility] ([place] for further review.

## 2022-06-29 DIAGNOSIS — H40023 Open angle with borderline findings, high risk, bilateral: Secondary | ICD-10-CM | POA: Diagnosis not present

## 2022-06-29 DIAGNOSIS — H2513 Age-related nuclear cataract, bilateral: Secondary | ICD-10-CM | POA: Diagnosis not present

## 2022-06-29 DIAGNOSIS — E119 Type 2 diabetes mellitus without complications: Secondary | ICD-10-CM | POA: Diagnosis not present

## 2022-06-29 DIAGNOSIS — H2512 Age-related nuclear cataract, left eye: Secondary | ICD-10-CM | POA: Diagnosis not present

## 2022-06-29 LAB — HM DIABETES EYE EXAM

## 2022-06-30 ENCOUNTER — Other Ambulatory Visit: Payer: Self-pay

## 2022-06-30 ENCOUNTER — Telehealth: Payer: Self-pay | Admitting: Gastroenterology

## 2022-06-30 NOTE — Telephone Encounter (Signed)
Patient called in to see if it would be possible for him to wean down to one tablet daily of mesalamine. He says for about 9 months he has been taking 2 tablets daily (2.4 g) and symptoms have been managed well, no bleeding; however his stools are more loose, and he is hoping that if he could taper down the dose that his stool would firm up. Last seen in [place] on 05/07/21 with Dr. Tarri Glenn. Pt would like MD thoughts on tapering.

## 2022-06-30 NOTE — Telephone Encounter (Signed)
Inbound call from patient requesting to speak with nurse about lowering his dosage of mesalamine. Please advise.

## 2022-07-01 NOTE — Telephone Encounter (Signed)
Spoke with patient regarding MD recommendations. Pt verbalized all understanding.

## 2022-07-11 ENCOUNTER — Encounter: Payer: Self-pay | Admitting: Family Medicine

## 2022-07-12 DIAGNOSIS — M5416 Radiculopathy, lumbar region: Secondary | ICD-10-CM | POA: Diagnosis not present

## 2022-07-12 DIAGNOSIS — M47816 Spondylosis without myelopathy or radiculopathy, lumbar region: Secondary | ICD-10-CM | POA: Diagnosis not present

## 2022-07-27 DIAGNOSIS — R051 Acute cough: Secondary | ICD-10-CM | POA: Diagnosis not present

## 2022-07-27 DIAGNOSIS — U071 COVID-19: Secondary | ICD-10-CM | POA: Diagnosis not present

## 2022-07-27 DIAGNOSIS — R519 Headache, unspecified: Secondary | ICD-10-CM | POA: Diagnosis not present

## 2022-07-27 DIAGNOSIS — R430 Anosmia: Secondary | ICD-10-CM | POA: Diagnosis not present

## 2022-07-28 ENCOUNTER — Encounter: Payer: Self-pay | Admitting: Family Medicine

## 2022-08-03 ENCOUNTER — Ambulatory Visit (INDEPENDENT_AMBULATORY_CARE_PROVIDER_SITE_OTHER): Payer: PPO | Admitting: Family Medicine

## 2022-08-03 ENCOUNTER — Encounter: Payer: Self-pay | Admitting: Family Medicine

## 2022-08-03 VITALS — BP 146/62 | HR 57 | Temp 98.0°F | Ht 72.0 in | Wt 163.6 lb

## 2022-08-03 DIAGNOSIS — I10 Essential (primary) hypertension: Secondary | ICD-10-CM

## 2022-08-03 DIAGNOSIS — E1142 Type 2 diabetes mellitus with diabetic polyneuropathy: Secondary | ICD-10-CM | POA: Diagnosis not present

## 2022-08-03 DIAGNOSIS — U071 COVID-19: Secondary | ICD-10-CM

## 2022-08-03 LAB — POCT GLYCOSYLATED HEMOGLOBIN (HGB A1C): Hemoglobin A1C: 6.2 % — AB (ref 4.0–5.6)

## 2022-08-03 MED ORDER — LISINOPRIL 20 MG PO TABS: 20.0000 mg | ORAL_TABLET | Freq: Every day | ORAL | Status: DC

## 2022-08-03 NOTE — Patient Instructions (Signed)
Please return in 3 months for your annual complete physical; please come fasting.   Please go back to taking lisinopril '20mg'$  daily. Let me know if you have any lightheadedness upon standing.   If you have any questions or concerns, please don't hesitate to send me a message via MyChart or call the office at [phone]. Thank you for visiting with Shannon Hodge today! It's our pleasure caring for you.

## 2022-08-03 NOTE — Progress Notes (Signed)
Subjective  CC:  Chief Complaint  Patient presents with   Diabetes    HPI: Shannon Hodge is a 77 y.o. male who presents to the office today for follow up of diabetes and problems listed above in the chief complaint.  Diabetes follow up: His diabetic control is reported as Unchanged. On lower dose of metformin xr 1500 and tolerating well. Appetitie is unchanged but managing to eat better and gain weight.  He denies exertional CP or SOB or symptomatic hypoglycemia. He denies foot sores chronic neuropathy persists.  HTN: bp was 136/72 at recent [facility] visit. Had covid; has recovered well. We lowered lisinopril to 10 from 20 last visit. No cp or sob  Wt Readings from Last 3 Encounters:  08/03/22 163 lb 9.6 oz (74.2 kg)  05/02/22 160 lb (72.6 kg)  03/03/22 163 lb (73.9 kg)    BP Readings from Last 3 Encounters:  08/03/22 (!) 146/62  05/02/22 (!) 100/50  03/03/22 (!) 108/59    Assessment  1. Controlled type 2 diabetes mellitus with diabetic polyneuropathy, without long-term current use of insulin ([place])   2. Essential hypertension   3. COVID      Plan  Diabetes is currently very well controlled. Continue met daily HTN: increase lisinopril back to 20 daily to maintain control. Contine healthy diet Recovered from covid well.   Follow up: 3 mo for cpe and f/u. Orders Placed This Encounter  Procedures   POCT HgB A1C   Meds ordered this encounter  Medications   lisinopril (ZESTRIL) 20 MG tablet    Sig: Take 1 tablet (20 mg total) by mouth daily.      Immunization History  Administered Date(s) Administered   Fluad Quad(high Dose 65+) 03/30/2020, 04/29/2022   Influenza, High Dose Seasonal PF 05/09/2016, 05/04/2017, 04/12/2018, 03/30/2020, 04/09/2021, 04/29/2022   Influenza,inj,Quad PF,6+ Mos 04/05/2019   Influenza-Unspecified 05/01/2015   PFIZER Comirnaty(Gray Top)Covid-19 Tri-Sucrose Vaccine 10/28/2020, 04/29/2022   PFIZER(Purple Top)SARS-COV-2 Vaccination  08/22/2019, 09/12/2019, 03/30/2020   Pfizer Covid-19 Vaccine Bivalent Booster 92yr & up 04/09/2021, 11/23/2021   Pneumococcal Conjugate-13 10/03/2016   Pneumococcal Polysaccharide-23 01/27/2011, 09/21/2017   Tdap 01/27/2011   Zoster Recombinat (Shingrix) 06/25/2018, 09/06/2018   Zoster, Live 01/19/2010    Diabetes Related Lab Review: Lab Results  Component Value Date   HGBA1C 6.2 (A) 08/03/2022   HGBA1C 5.9 (A) 05/02/2022   HGBA1C 6.4 10/29/2021    Lab Results  Component Value Date   MICROALBUR <0.7 10/29/2021   Lab Results  Component Value Date   CREATININE 1.14 10/29/2021   BUN 23 10/29/2021   NA 141 10/29/2021   K 3.9 10/29/2021   CL 104 10/29/2021   CO2 32 10/29/2021   Lab Results  Component Value Date   CHOL 110 10/29/2021   CHOL 152 10/26/2020   CHOL 146 10/07/2019   Lab Results  Component Value Date   HDL 43.00 10/29/2021   HDL 51.30 10/26/2020   HDL 58.20 10/07/2019   Lab Results  Component Value Date   LDLCALC 50 10/29/2021   LDLCALC 76 10/26/2020   LDLCALC 71 10/07/2019   Lab Results  Component Value Date   TRIG 84.0 10/29/2021   TRIG 122.0 10/26/2020   TRIG 86.0 10/07/2019   Lab Results  Component Value Date   CHOLHDL 3 10/29/2021   CHOLHDL 3 10/26/2020   CHOLHDL 3 10/07/2019   No results found for: "LDLDIRECT" The ASCVD Risk score (Arnett DK, et al., 2019) failed to calculate for the following reasons:  The valid total cholesterol range is 130 to 320 mg/dL I have reviewed the PMH, Fam and Soc history. Patient Active Problem List   Diagnosis Date Noted   Inflammatory bowel disease 10/26/2020    Priority: High    Unspecified. Seeing [facility]    Essential hypertension 04/01/2020    Priority: High   Spinal stenosis of lumbar region 04/01/2020    Priority: High    Dr. 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 mellitus ([place]) 01/01/2019    Priority: High   Restless leg syndrome 09/21/2017    Priority: High    Mixed hyperlipidemia 09/21/2017    Priority: High   Controlled type 2 diabetes mellitus with diabetic polyneuropathy, without long-term current use of insulin ([place]) 09/01/2016    Priority: High   Insomnia 02/22/2016    Priority: High   Lumbar radiculopathy 11/04/2014    Priority: High   Hearing loss 09/21/2017    Priority: Medium     Wears hearing aides    Muscle cramp, nocturnal 09/21/2017    Priority: Medium    Foot drop, left 09/21/2017    Priority: Medium    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 He has never used smokeless tobacco. He reports that he does not drink alcohol and does not use drugs.  Review of Systems: Ophthalmic: negative for eye pain, loss of vision or double vision Cardiovascular: negative for chest pain Respiratory: negative for SOB or persistent cough Gastrointestinal: negative for abdominal pain Genitourinary: negative for dysuria or gross hematuria MSK: negative for foot lesions Neurologic: negative for weakness or gait disturbance  Objective  Vitals: BP (!) 146/62   Pulse (!) 57   Temp 98 F (36.7 C)   Ht 6' (1.829 m)   Wt 163 lb 9.6 oz (74.2 kg)   SpO2 99%   BMI 22.19 kg/m  General: well appearing, no acute distress  Psych:  Alert and oriented, normal mood and affect HEENT:  Normocephalic, atraumatic, moist mucous membranes, supple neck  Cardiovascular:  Nl S1 and S2, RRR without murmur, gallop or rub. no edema Respiratory:  Good breath sounds bilaterally, CTAB with normal effort, no rales  Diabetic education: ongoing education regarding chronic disease management for diabetes was given today. We continue to reinforce the ABC's of diabetic  management: A1c (<7 or 8 dependent upon patient), tight blood pressure control, and cholesterol management with goal LDL < 100 minimally. We discuss diet strategies, exercise recommendations, medication options and possible side effects. At each visit, we review recommended immunizations and preventive care recommendations for diabetics and stress that good diabetic control can prevent other problems. See below for this patient's data.   Commons side effects, risks, benefits, and alternatives for medications and treatment plan prescribed today were discussed, and the patient expressed understanding of the given instructions. Patient is instructed to call or message via MyChart if he/she has any questions or concerns regarding our treatment plan. No barriers to understanding were identified. We discussed Red Flag symptoms and signs in detail. Patient expressed understanding regarding what to do in case of urgent or emergency type symptoms.  Medication  list was reconciled, printed and provided to the patient in AVS. Patient instructions and summary information was reviewed with the patient as documented in the AVS. This note was prepared with assistance of Dragon voice recognition software. Occasional wrong-word or sound-a-like substitutions may have occurred due to the inherent limitations of voice recognition software

## 2022-08-12 DIAGNOSIS — M47816 Spondylosis without myelopathy or radiculopathy, lumbar region: Secondary | ICD-10-CM | POA: Diagnosis not present

## 2022-08-21 ENCOUNTER — Other Ambulatory Visit: Payer: Self-pay | Admitting: Family Medicine

## 2022-08-21 DIAGNOSIS — E1142 Type 2 diabetes mellitus with diabetic polyneuropathy: Secondary | ICD-10-CM

## 2022-08-23 ENCOUNTER — Telehealth: Payer: Self-pay | Admitting: Gastroenterology

## 2022-08-23 NOTE — Telephone Encounter (Signed)
Inbound call from patient stating that he has a current issues that he would like to discuss with the nurse. Please advise.

## 2022-08-23 NOTE — Telephone Encounter (Signed)
Called and spoke with patient. Pt states that he had some concerns regarding his bowels. He states that he has continued to take 2 Mesalamine tablets daily, but his PCP added iron and Vitamin C. Pt states that his stools are soft and he wants to know what he can take. I advised pt that he has not been seen in the office in almost 2 year and would need a visit for reassessment and recommendations. Pt has been scheduled for a follow up with Dr. Tarri Glenn tomorrow at 1:30 pm. Pt verbalized understanding and had no concerns at the end of the call.

## 2022-08-24 ENCOUNTER — Ambulatory Visit: Payer: PPO | Admitting: Gastroenterology

## 2022-08-24 ENCOUNTER — Other Ambulatory Visit (INDEPENDENT_AMBULATORY_CARE_PROVIDER_SITE_OTHER): Payer: PPO

## 2022-08-24 ENCOUNTER — Telehealth: Payer: Self-pay | Admitting: Pharmacy Technician

## 2022-08-24 ENCOUNTER — Other Ambulatory Visit (HOSPITAL_COMMUNITY): Payer: Self-pay

## 2022-08-24 ENCOUNTER — Encounter: Payer: Self-pay | Admitting: Gastroenterology

## 2022-08-24 VITALS — BP 100/62 | HR 64 | Ht 72.0 in | Wt 165.0 lb

## 2022-08-24 DIAGNOSIS — R194 Change in bowel habit: Secondary | ICD-10-CM

## 2022-08-24 LAB — C-REACTIVE PROTEIN: CRP: <1 mg/dL (ref 0.5–20.0)

## 2022-08-24 LAB — SEDIMENTATION RATE: Sed Rate: 1 mm/hr (ref 0–20)

## 2022-08-24 MED ORDER — RIFAXIMIN 550 MG PO TABS: 550.0000 mg | ORAL_TABLET | Freq: Three times a day (TID) | ORAL | 0 refills | Status: DC

## 2022-08-24 NOTE — Patient Instructions (Addendum)
Please stop in lab today. Your provider has requested that you go to the basement level for lab work before leaving today. Press "B" on the elevator. The lab is located at the first door on the left as you exit the elevator.  Please add a daily dose of Metamucil to add some bulk to your stool. You could even try twice daily Metamucil.  We discussed a trial of Xifaxan 550 mg three times daily for 2 weeks. This is to treatment possible bacterial overgrowth.   Please send me a MyChart message two weeks after completing the antibiotics with an update.

## 2022-08-24 NOTE — Telephone Encounter (Signed)
Patient Advocate Encounter  Received notification from [employer] that prior authorization for [place] is required.   PA submitted on 1.24.24 Key [id number] Status is pending    Luciano Cutter, CPhT Patient Advocate Phone: [phone]

## 2022-08-24 NOTE — Progress Notes (Signed)
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 Clinically improved on oral mesalamine and previous rowasa enema. He has successfully tapered down to Lialda 2.4 g daily and does not wish to taper further due to adequate control of symptoms.    Recent change in bowel habits. Attributed to mesalamine. Must screen for active IBD.   Normocytic anemia. No iron deficiency based on recent labs.  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  EGD +/- UGI series with and symptoms.   PLAN: - ESR, CRP, fecal calprotectin - Add a daily dose of Metamucil, consider BID dosing  - Continue Lialda to 2.4 g daily - Avoid all NSAIDs - Consider trial of dicyclomine 20 mg QID PRN if symptoms return - Colonoscopy if not improving  HPI: Shannon Hodge is a 77 y.o. male who returns in follow-up of inflammatory bowel disease. He was last seen in the office 05/07/21. Interval history is obtained through the patient and review of his electronic health record.   Initially diagnosed in 2013 by Dr. Wynetta Emery with colonoscopy showing mildly active chronic colitis in cecal biopsies and rectal biopsies.  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 Treated with mesalamine. Told to stop using ibuprofen.  Off treatment for years.  Symptoms recurred after using meloxicam for back pain.   Colonoscopy 06/19/20 showed  congested, erythematous and ulcerated mucosa in the rectum and in the recto-sigmoid colon.  Ileal biopsies were normal. Colon biopsies showed minimally active chronic colitis in the right colon and rectum, but, were otherwise normal.   Symptoms then resolved with Rowasa and Lialda. At the time of his last office visit 05/07/21 he was having normal, formed bowel movements. Wishing to reduce or even stop his Lialda  if possible.   Returns today reporting a change in bowel movements with poorly formed, nearly diarrheal stools since starting mesalamine. Temporal association of symptom onset with starting iron and vitamin C as recommended by PCP.  Spicy foods may be contributing. He thinks his colon is not absorbing water given the stool smears that he notes. No blood or mucous in the stool. He continues to require intermittent steroid injections and recent RFA for back pain but he doesn't find that helps. No other extraGI manifestations of IBD. He had Covid around Christmas but he feels these symptoms started prior to that time. No significant GI symptoms with Covid. Weight is stable.   Labs 04/14/20: hemoglobin 12.8 Labs 06/05/20: CRP <1, ESR 4, fecal calproectin 106, GI pathogen panel negative Labs 10/26/20: normal CMP except for glucose 139, hgb 12.7, platelets 193, MCV 91.3, RDW 12.9 Labs 05/02/22: iron 134, ferritin 58, hemoglobin 12.7, MCV 94.8, RDW 12.7, platelets 168  Endoscopic history:  - Colonoscopy with Dr. Wynetta Emery in 2013 showed mildly active chronic colitis in cecal biopsies and rectal biopsies.  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 Treated with mesalamine. Told to stop using ibuprofen.  - EGD and colonoscopy with Dr. [name] 11/01/16 for heme positive stools and a hemoglobin of 12.8. . EGD showed distal duodenal bulb benign stricture preventing intubation of the second portion duodenum. Colonoscopy was normal. Surveillance colonoscopy recommended in 10 years.  - Colonoscopy 06/19/20 showed  congested, erythematous and ulcerated mucosa in the rectum and in the recto-sigmoid colon.   Past Medical History:  Diagnosis Date  Basal cell carcinoma    upper back   Cataract    right eye   Diabetes ([place])    type 2   Diabetic neuropathy ([facility])    both legs   DJD (degenerative joint disease)    L 5   Hypercholesteremia    Hyperlipidemia    Hypertension     Inflammatory bowel disease 10/26/2020   Unspecified. Seeing [facility]   Neuropathy    Ruptured Achilles tendon due to trauma 10/2016   Ulcerative proctitis [facility])     Past Surgical History:  Procedure Laterality Date   basil cell     basil cell/mole removed from back   COLONOSCOPY WITH PROPOFOL N/A 11/01/2016   Procedure: COLONOSCOPY WITH PROPOFOL;  Surgeon: Garlan Fair, MD;  Location: [facility];  Service: Endoscopy;  Laterality: N/A;   colonscopy     x 2   cortisone injection  04/2021   lower back   ESOPHAGOGASTRODUODENOSCOPY (EGD) WITH PROPOFOL N/A 11/01/2016   Procedure: ESOPHAGOGASTRODUODENOSCOPY (EGD) WITH PROPOFOL;  Surgeon: Garlan Fair, MD;  Location: [facility];  Service: Endoscopy;  Laterality: N/A;   TONSILLECTOMY      Current Outpatient Medications  Medication Sig Dispense Refill   Alpha-Lipoic Acid 600 MG TABS Take by mouth.     Ascorbic Acid (VITAMIN C PO) Take by mouth.     Blood Glucose Monitoring Suppl (ONE TOUCH ULTRA 2) w/Device KIT USE AS DIRECTED 1 kit 0   celecoxib (CELEBREX) 100 MG capsule Take 100 mg by mouth 2 (two) times daily.     cholecalciferol (VITAMIN D) 1000 units tablet Take 1,000 Units by mouth daily.     Coenzyme Q10 (COQ10) 100 MG CAPS Take 1 capsule by mouth daily.     Cyanocobalamin (VITAMIN B-12 PO) Take by mouth daily.     cyclobenzaprine (FLEXERIL) 5 MG tablet Take 5 mg by mouth at bedtime as needed.     fenofibrate micronized (LOFIBRA) 134 MG capsule TAKE 1 CAPSULE BY MOUTH DAILY BEFORE BREAKFAST. 90 capsule 3   Ferrous Sulfate (IRON PO) Take by mouth.     gabapentin (NEURONTIN) 600 MG tablet Take 1 tablet (600 mg total) by mouth 4 (four) times daily as needed. 360 tablet 1   glucose blood (FREESTYLE LITE) test strip Use as instructed 100 each 12   Lancets (ONETOUCH ULTRASOFT) lancets Use as instructed 100 each 12   lisinopril (ZESTRIL) 20 MG tablet Take 1 tablet (20 mg total) by mouth daily.     Magnesium Oxide 500 MG TABS  Take by mouth.     mesalamine (LIALDA) 1.2 g EC tablet TAKE 4 TABLETS BY MOUTH DAILY WITH BREAKFAST. 360 tablet 1   metFORMIN (GLUCOPHAGE-XR) 500 MG 24 hr tablet TAKE 4 TABLETS (2,000 MG TOTAL) BY MOUTH DAILY 360 tablet 3   rifaximin (XIFAXAN) 550 MG TABS tablet Take 1 tablet (550 mg total) by mouth 3 (three) times daily. 42 tablet 0   simvastatin (ZOCOR) 40 MG tablet TAKE 1 TABLET BY MOUTH EVERYDAY AT BEDTIME 90 tablet 3   tiZANidine (ZANAFLEX) 2 MG tablet TAKE 1 TABLET BY MOUTH EVERY 8 HOURS AS NEEDED FOR UP TO 30 DAYS.     valACYclovir (VALTREX) 500 MG tablet Take 1 tablet (500 mg total) by mouth daily as needed (outbreak). 30 tablet 2   No current facility-administered medications for this visit.    Allergies as of 08/24/2022   (No Known Allergies)    Family History  Problem Relation Age of Onset  Pancreatic cancer Mother    Stroke Father    Brain cancer Father    Diabetes Father    Healthy Daughter    Healthy Son    Diabetes Maternal Grandmother    Diabetes Maternal Grandfather    Diabetes Paternal Grandmother    Diabetes Paternal Grandfather    Colon cancer Neg Hx    Colon polyps Neg Hx    Esophageal cancer Neg Hx    Stomach cancer Neg Hx      Physical Exam: Gen: Awake, alert, and oriented, and well communicative. HEENT: EOMI, non-icteric sclera, NCAT, MMM  Neck: Normal movement of head and neck  Pulm: No labored breathing, speaking in full sentences without conversational dyspnea  Abd: Soft, NT, ND, normal bowel sounds Derm: No apparent lesions or bruising in visible field  MS: Moves all visible extremities without noticeable abnormality  Psych: Pleasant, cooperative, normal speech, thought processing seemingly intact    I spent 35 minutes, including in depth chart review, independent review of results, communicating results with the patient directly, face-to-face time with the patient, coordinating care, and ordering studies and medications as appropriate, and  documentation.   Patte Winkel L. Tarri Glenn, MD, MPH 08/24/2022, 5:16 PM

## 2022-08-25 ENCOUNTER — Other Ambulatory Visit: Payer: PPO

## 2022-08-25 DIAGNOSIS — R194 Change in bowel habit: Secondary | ICD-10-CM | POA: Diagnosis not present

## 2022-08-25 NOTE — Telephone Encounter (Signed)
Pharmacy Patient Advocate Encounter  Received notification from [facility] that the request for prior authorization for XIFAXAN '550MG'$  has been denied due to it is being used for an indication which is not approved or medically accepted: small intestinal bacterial overgrowth.    Please be advised we currently do not have a Pharmacist to review denials, therefore you will need to process appeals accordingly as needed. Thanks for your support at this time.   You may call [phone] or fax [phone], to appeal.

## 2022-08-29 NOTE — Telephone Encounter (Signed)
Please advise 

## 2022-08-30 LAB — CALPROTECTIN, FECAL: Calprotectin, Fecal: 22 ug/g (ref 0–120)

## 2022-08-31 ENCOUNTER — Encounter: Payer: Self-pay | Admitting: Gastroenterology

## 2022-09-02 MED ORDER — DOXYCYCLINE MONOHYDRATE 100 MG PO CAPS: 100.0000 mg | ORAL_CAPSULE | Freq: Two times a day (BID) | ORAL | 0 refills | Status: AC

## 2022-09-02 NOTE — Telephone Encounter (Signed)
Patient informed Doxycyline has been sent to pharmacy to replace Xifaxan.

## 2022-09-02 NOTE — Telephone Encounter (Signed)
Sent script in for Doxycyline.

## 2022-09-06 DIAGNOSIS — M47816 Spondylosis without myelopathy or radiculopathy, lumbar region: Secondary | ICD-10-CM | POA: Diagnosis not present

## 2022-09-12 DIAGNOSIS — M549 Dorsalgia, unspecified: Secondary | ICD-10-CM | POA: Diagnosis not present

## 2022-09-12 DIAGNOSIS — M48061 Spinal stenosis, lumbar region without neurogenic claudication: Secondary | ICD-10-CM | POA: Diagnosis not present

## 2022-09-13 ENCOUNTER — Encounter: Payer: Self-pay | Admitting: Diagnostic Neuroimaging

## 2022-09-13 ENCOUNTER — Ambulatory Visit: Payer: PPO | Admitting: Diagnostic Neuroimaging

## 2022-09-13 VITALS — BP 125/75 | HR 80 | Ht 72.0 in | Wt 162.2 lb

## 2022-09-13 DIAGNOSIS — E1142 Type 2 diabetes mellitus with diabetic polyneuropathy: Secondary | ICD-10-CM

## 2022-09-13 DIAGNOSIS — R252 Cramp and spasm: Secondary | ICD-10-CM | POA: Diagnosis not present

## 2022-09-13 DIAGNOSIS — M5416 Radiculopathy, lumbar region: Secondary | ICD-10-CM | POA: Diagnosis not present

## 2022-09-13 NOTE — Progress Notes (Signed)
[facility]  PATIENT: Shannon Hodge DOB: 1946/05/11  REFERRING CLINICIAN:  HISTORY FROM: patient REASON FOR VISIT: follow up   HISTORICAL  CHIEF COMPLAINT:  Chief Complaint  Patient presents with   Follow-up    Patient in room #6 and alone. Pt states he's been having some balance issue and had a fall last week. Pt has been seen for that fall.    HISTORY OF PRESENT ILLNESS:   UPDATE (09/13/22, VRP): Since last visit, doing about the same. More issues with back pain, ankle, calf pain. Has been to pain mgmt, orthopedic surgery, neurosurgery clinic.   UPDATE (04/07/20, VRP): Since last visit, having more insomnia and nocturnal muscle cramps. Neuropathy symptoms are not painful.  UPDATE (02/20/18, VRP): Since last visit, doing about the same. Symptoms are stable. Severity is mild. No alleviating or aggravating factors. Tolerating gabapentin.  UPDATE 01/27/17: Since last visit, had right achilles tear on 11/15/16. Now healing and recovering. Overall pain in feet and legs are stable.  UPDATE 07/28/16: Since last visit, sxs are slightly worse. Night time cramping, pain and interrupted sleep.  UPDATE 11/23/15: Since last visit, neuropathy sxs are stable. Exercising more and doing well. Nighttime cramps.   UPDATE 05/20/15: Since last visit, tried PT and using gabapentin. Still with cramps, numbness and left hip pain.  PRIOR HPI (11/04/14): 77 year old right-handed male here for evaluation of low back pain, leg cramps, neuropathy. Patient reports some symptoms starting around age 56-69 years old, but more significant symptoms in the last 6-12 months. In his 77s, patient developed low back pain, rating left side, left leg, with mild left foot drop. Patient was diagnosed with some degenerative lumbar spine disease, possible left L5 radiculopathy, treated conservatively. Patient had lingering symptoms throughout his life. Occasionally he would have cramping, pain in his left leg. In  2000, patient diagnosed diabetes. Over past 1-2 years he has noticed some numbness and tingling in his toes and feet, mainly the bottom. He feels a thick, numb, dead sensation in his feet. No significant electrical, stinging, pins and needles pain in his feet. Patient started on gabapentin by PCP which has mildly helped his numbness symptoms. Now patient having more problems with cramping in his left leg, hamstring region, calf, intermittently, mainly when he lays down or sits down for a long time. Moving, standing, stretching seems to help. He is fairly active at the gym several times per week, using upper body strength machines, weightlifting, treadmill walking.   REVIEW OF SYSTEMS: Full 14 system review of systems performed and negative: except for freq waking.    ALLERGIES: No Known Allergies  HOME MEDICATIONS: Outpatient Medications Prior to Visit  Medication Sig Dispense Refill   Alpha-Lipoic Acid 600 MG TABS Take by mouth.     Ascorbic Acid (VITAMIN C PO) Take by mouth.     Blood Glucose Monitoring Suppl (ONE TOUCH ULTRA 2) w/Device KIT USE AS DIRECTED 1 kit 0   celecoxib (CELEBREX) 100 MG capsule Take 100 mg by mouth 2 (two) times daily.     cholecalciferol (VITAMIN D) 1000 units tablet Take 1,000 Units by mouth daily.     Coenzyme Q10 (COQ10) 100 MG CAPS Take 1 capsule by mouth daily.     Cyanocobalamin (VITAMIN B-12 PO) Take by mouth daily.     cyclobenzaprine (FLEXERIL) 5 MG tablet Take 5 mg by mouth at bedtime as needed.     doxycycline (MONODOX) 100 MG capsule Take 1 capsule (100 mg total) by mouth 2 (two)  times daily for 14 days. 28 capsule 0   fenofibrate micronized (LOFIBRA) 134 MG capsule TAKE 1 CAPSULE BY MOUTH DAILY BEFORE BREAKFAST. 90 capsule 3   Ferrous Sulfate (IRON PO) Take by mouth.     gabapentin (NEURONTIN) 600 MG tablet Take 1 tablet (600 mg total) by mouth 4 (four) times daily as needed. 360 tablet 1   glucose blood (FREESTYLE LITE) test strip Use as instructed  100 each 12   Lancets (ONETOUCH ULTRASOFT) lancets Use as instructed 100 each 12   lisinopril (ZESTRIL) 20 MG tablet Take 1 tablet (20 mg total) by mouth daily.     Magnesium Oxide 500 MG TABS Take by mouth.     mesalamine (LIALDA) 1.2 g EC tablet TAKE 4 TABLETS BY MOUTH DAILY WITH BREAKFAST. 360 tablet 1   metFORMIN (GLUCOPHAGE-XR) 500 MG 24 hr tablet TAKE 4 TABLETS (2,000 MG TOTAL) BY MOUTH DAILY 360 tablet 3   simvastatin (ZOCOR) 40 MG tablet TAKE 1 TABLET BY MOUTH EVERYDAY AT BEDTIME 90 tablet 3   tiZANidine (ZANAFLEX) 2 MG tablet TAKE 1 TABLET BY MOUTH EVERY 8 HOURS AS NEEDED FOR UP TO 30 DAYS.     valACYclovir (VALTREX) 500 MG tablet Take 1 tablet (500 mg total) by mouth daily as needed (outbreak). 30 tablet 2   rifaximin (XIFAXAN) 550 MG TABS tablet Take 1 tablet (550 mg total) by mouth 3 (three) times daily. 42 tablet 0   No facility-administered medications prior to visit.    PAST MEDICAL HISTORY: Past Medical History:  Diagnosis Date   Basal cell carcinoma    upper back   Cataract    right eye   Diabetes ([place])    type 2   Diabetic neuropathy (HCC)    both legs   DJD (degenerative joint disease)    L 5   Hypercholesteremia    Hyperlipidemia    Hypertension    Inflammatory bowel disease 10/26/2020   Unspecified. Seeing [facility]   Neuropathy    Ruptured Achilles tendon due to trauma 10/2016   Ulcerative proctitis ([place])     PAST SURGICAL HISTORY: Past Surgical History:  Procedure Laterality Date   basil cell     basil cell/mole removed from back   COLONOSCOPY WITH PROPOFOL N/A 11/01/2016   Procedure: COLONOSCOPY WITH PROPOFOL;  Surgeon: Garlan Fair, MD;  Location: [facility];  Service: Endoscopy;  Laterality: N/A;   colonscopy     x 2   cortisone injection  04/2021   lower back   ESOPHAGOGASTRODUODENOSCOPY (EGD) WITH PROPOFOL N/A 11/01/2016   Procedure: ESOPHAGOGASTRODUODENOSCOPY (EGD) WITH PROPOFOL;  Surgeon: Garlan Fair, MD;  Location: [facility];  Service: Endoscopy;  Laterality: N/A;   TONSILLECTOMY      FAMILY HISTORY: Family History  Problem Relation Age of Onset   Pancreatic cancer Mother    Stroke Father    Brain cancer Father    Diabetes Father    Healthy Daughter    Healthy Son    Diabetes Maternal Grandmother    Diabetes Maternal Grandfather    Diabetes Paternal Grandmother    Diabetes Paternal Grandfather    Colon cancer Neg Hx    Colon polyps Neg Hx    Esophageal cancer Neg Hx    Stomach cancer Neg Hx     SOCIAL HISTORY:  Social History   Socioeconomic History   Marital status: Married    Spouse name: Diane    Number of children: 3   Years of education:  post grad   Highest education level: Not on file  Occupational History   Occupation: retired English as a second language teacher  Tobacco Use   Smoking status: Never   Smokeless tobacco: Never  Vaping Use   Vaping Use: Never used  Substance and Sexual Activity   Alcohol use: No   Drug use: No   Sexual activity: Not Currently  Other Topics Concern   Not on file  Social History Narrative   Lives with wife    Drinks 2 cups of coffee a day   Social Determinants of Health   Financial Resource Strain: Low Risk  (01/14/2022)   Overall Financial Resource Strain (CARDIA)    Difficulty of Paying Living Expenses: Not hard at all  Food Insecurity: No Food Insecurity (01/14/2022)   Hunger Vital Sign    Worried About Running Out of Food in the Last Year: Never true    Ran Out of Food in the Last Year: Never true  Transportation Needs: No Transportation Needs (01/14/2022)   PRAPARE - Hydrologist (Medical): No    Lack of Transportation (Non-Medical): No  Physical Activity: Sufficiently Active (01/14/2022)   Exercise Vital Sign    Days of Exercise per Week: 5 days    Minutes of Exercise per Session: 30 min  Stress: No Stress Concern Present (01/14/2022)   [place]    Feeling  of Stress : Only a little  Social Connections: Moderately Isolated (01/14/2022)   Social Connection and Isolation Panel [NHANES]    Frequency of Communication with Friends and Family: More than three times a week    Frequency of Social Gatherings with Friends and Family: More than three times a week    Attends Religious Services: Never    Marine scientist or Organizations: No    Attends Archivist Meetings: Never    Marital Status: Married  Human resources officer Violence: Not At Risk (01/14/2022)   Humiliation, Afraid, Rape, and Kick questionnaire    Fear of Current or Ex-Partner: No    Emotionally Abused: No    Physically Abused: No    Sexually Abused: No     PHYSICAL EXAM  GENERAL EXAM/CONSTITUTIONAL: Vitals:  Vitals:   09/13/22 1512  BP: 125/75  Pulse: 80  Weight: 162 lb 3.2 oz (73.6 kg)  Height: 6' (1.829 m)   Body mass index is 22 kg/m. Wt Readings from Last 10 Encounters:  09/13/22 162 lb 3.2 oz (73.6 kg)  08/24/22 165 lb (74.8 kg)  08/03/22 163 lb 9.6 oz (74.2 kg)  05/02/22 160 lb (72.6 kg)  03/03/22 163 lb (73.9 kg)  01/14/22 165 lb 3.2 oz (74.9 kg)  10/29/21 166 lb 3.2 oz (75.4 kg)  06/14/21 169 lb 3.2 oz (76.7 kg)  05/07/21 173 lb (78.5 kg)  01/26/21 174 lb (78.9 kg)   Patient is in no distress; well developed, nourished and groomed; neck is supple  CARDIOVASCULAR: Examination of carotid arteries is normal; no carotid bruits Regular rate and rhythm, no murmurs Examination of peripheral vascular system by observation and palpation is normal  EYES: Ophthalmoscopic exam of optic discs and posterior segments is normal; no papilledema or hemorrhages No results found.  MUSCULOSKELETAL: Gait, strength, tone, movements noted in Neurologic exam below  NEUROLOGIC: MENTAL STATUS:     09/05/2018    9:40 AM  MMSE - Mini Mental State Exam  Orientation to time 5  Orientation to Place 5  Registration 3  Attention/ Calculation 5  Recall 3  Language-  name 2 objects 2  Language- repeat 1  Language- follow 3 step command 3  Language- read & follow direction 1  Write a sentence 1  Copy design 1  Total score 30   awake, alert, oriented to person, place and time recent and remote memory intact normal attention and concentration language fluent, comprehension intact, naming intact,  fund of knowledge appropriate  CRANIAL NERVE:  2nd - no papilledema on fundoscopic exam 2nd, 3rd, 4th, 6th - pupils equal and reactive to light, visual fields full to confrontation, extraocular muscles intact, no nystagmus 5th - facial sensation symmetric 7th - facial strength symmetric 8th - hearing intact 9th - palate elevates symmetrically, uvula midline 11th - shoulder shrug symmetric 12th - tongue protrusion midline  MOTOR:  normal bulk and tone, full strength in the BUE, BLE MINIMAL POSTURAL TREMOR IN RIGHT UPPER EXT  SENSORY:  normal and symmetric to light touch, temperature; DECR VIBRATION  COORDINATION:  finger-nose-finger, fine finger movements normal  REFLEXES:  deep tendon reflexes TRACE and symmetric  GAIT/STATION:  narrow based gait      DIAGNOSTIC DATA (LABS, IMAGING, TESTING) - I reviewed patient records, labs, notes, testing and imaging myself where available.  Lab Results  Component Value Date   WBC 4.2 05/02/2022   HGB 12.7 (L) 05/02/2022   HCT 37.1 (L) 05/02/2022   MCV 94.8 05/02/2022   PLT 168.0 05/02/2022      Component Value Date/Time   NA 141 10/29/2021 1152   NA 142 08/30/2017 0000   K 3.9 10/29/2021 1152   CL 104 10/29/2021 1152   CO2 32 10/29/2021 1152   GLUCOSE 84 10/29/2021 1152   BUN 23 10/29/2021 1152   BUN 19 08/30/2017 0000   CREATININE 1.14 10/29/2021 1152   CALCIUM 9.6 10/29/2021 1152   PROT 6.6 10/29/2021 1152   ALBUMIN 4.5 10/29/2021 1152   AST 17 10/29/2021 1152   ALT 18 10/29/2021 1152   ALKPHOS 40 10/29/2021 1152   BILITOT 0.7 10/29/2021 1152   Lab Results  Component Value  Date   CHOL 110 10/29/2021   HDL 43.00 10/29/2021   LDLCALC 50 10/29/2021   TRIG 84.0 10/29/2021   CHOLHDL 3 10/29/2021   Lab Results  Component Value Date   HGBA1C 6.2 (A) 08/03/2022   Lab Results  Component Value Date   [id number] 05/02/2022   Lab Results  Component Value Date   TSH 1.16 10/07/2019     01/04/18 CT abd / pelvis [I reviewed images myself and agree with interpretation. There are degenerative changes throughout the lumbar spine.  Moderate by foraminal stenosis at L3-4 and L4-5.  Vacuum disc phenomenon noted. -VRP]   06/19/19 MRI lumbar spine [I reviewed images myself and agree with interpretation. -VRP]  1. Disc degeneration from L2-3 to L5-S1 with straightening of the back. 2. L4-5 biforaminal stenosis with L4 mass-effect on the right. Spinal stenosis is moderate. 3. L5-S1 biforaminal impingement particularly advanced on the left. 4. Transitional S1 vertebra.  09/26/19 EMG/NCS - Axonal sensorimotor polyneuropathy.    ASSESSMENT AND PLAN  77 y.o. year old male here with 30+ years of low back pain, radiating to left leg, with intermittent left leg cramps / pain.   Meds tried: gabapentin, cymbalta, amitriptyline  Dx: left lumbar radiculopathy (L5) + diabetic neuropathy + muscle cramps (due to neuropathy and radiculopathy and restless leg syndrome)  1. Diabetic polyneuropathy associated with type 2 diabetes mellitus ([place])  2. Lumbar radiculopathy   3. Leg cramps      PLAN:  MUSCLE CRAMPS / NUMBNESS IN FEET / NEUROPATHY / INSOMNIA (dx: neuropathy, lumbar radiculopathy) - continue gabapentin; recommend to follow up with pain mgmt clinic for comprehensive tx of neuropathy and lumbar radiculopathies - continue physical activities; continue PT exercises  Return for pending if symptoms worsen or fail to improve.  I spent 45 minutes of face-to-face and non-face-to-face time with patient.  This included previsit chart review, lab review, study review,  order entry, electronic health record documentation, patient education.     Penni Bombard, MD [id number], [id number] PM Certified in Neurology, Neurophysiology and Neuroimaging  [facility] [address] [place], [place] [address] [phone]

## 2022-09-15 ENCOUNTER — Other Ambulatory Visit: Payer: Self-pay | Admitting: Family Medicine

## 2022-09-15 DIAGNOSIS — I10 Essential (primary) hypertension: Secondary | ICD-10-CM

## 2022-09-15 DIAGNOSIS — E782 Mixed hyperlipidemia: Secondary | ICD-10-CM

## 2022-09-20 DIAGNOSIS — L298 Other pruritus: Secondary | ICD-10-CM | POA: Diagnosis not present

## 2022-09-20 DIAGNOSIS — L538 Other specified erythematous conditions: Secondary | ICD-10-CM | POA: Diagnosis not present

## 2022-09-20 DIAGNOSIS — Z789 Other specified health status: Secondary | ICD-10-CM | POA: Diagnosis not present

## 2022-09-20 DIAGNOSIS — L820 Inflamed seborrheic keratosis: Secondary | ICD-10-CM | POA: Diagnosis not present

## 2022-09-20 DIAGNOSIS — R208 Other disturbances of skin sensation: Secondary | ICD-10-CM | POA: Diagnosis not present

## 2022-09-20 DIAGNOSIS — L821 Other seborrheic keratosis: Secondary | ICD-10-CM | POA: Diagnosis not present

## 2022-09-20 DIAGNOSIS — L814 Other melanin hyperpigmentation: Secondary | ICD-10-CM | POA: Diagnosis not present

## 2022-09-20 DIAGNOSIS — C44519 Basal cell carcinoma of skin of other part of trunk: Secondary | ICD-10-CM | POA: Diagnosis not present

## 2022-09-20 DIAGNOSIS — D1801 Hemangioma of skin and subcutaneous tissue: Secondary | ICD-10-CM | POA: Diagnosis not present

## 2022-09-22 DIAGNOSIS — M549 Dorsalgia, unspecified: Secondary | ICD-10-CM | POA: Diagnosis not present

## 2022-09-22 DIAGNOSIS — E1142 Type 2 diabetes mellitus with diabetic polyneuropathy: Secondary | ICD-10-CM | POA: Diagnosis not present

## 2022-10-11 DIAGNOSIS — R2689 Other abnormalities of gait and mobility: Secondary | ICD-10-CM | POA: Diagnosis not present

## 2022-10-12 DIAGNOSIS — G894 Chronic pain syndrome: Secondary | ICD-10-CM | POA: Diagnosis not present

## 2022-10-12 DIAGNOSIS — M47816 Spondylosis without myelopathy or radiculopathy, lumbar region: Secondary | ICD-10-CM | POA: Diagnosis not present

## 2022-10-12 DIAGNOSIS — E1142 Type 2 diabetes mellitus with diabetic polyneuropathy: Secondary | ICD-10-CM | POA: Diagnosis not present

## 2022-10-18 DIAGNOSIS — R2689 Other abnormalities of gait and mobility: Secondary | ICD-10-CM | POA: Diagnosis not present

## 2022-10-19 ENCOUNTER — Other Ambulatory Visit: Payer: Self-pay

## 2022-10-19 DIAGNOSIS — E1142 Type 2 diabetes mellitus with diabetic polyneuropathy: Secondary | ICD-10-CM

## 2022-10-19 MED ORDER — GABAPENTIN 600 MG PO TABS: 600.0000 mg | ORAL_TABLET | Freq: Four times a day (QID) | ORAL | 3 refills | Status: DC | PRN

## 2022-10-26 DIAGNOSIS — R2689 Other abnormalities of gait and mobility: Secondary | ICD-10-CM | POA: Diagnosis not present

## 2022-11-01 DIAGNOSIS — R2689 Other abnormalities of gait and mobility: Secondary | ICD-10-CM | POA: Diagnosis not present

## 2022-11-03 ENCOUNTER — Encounter: Payer: Self-pay | Admitting: Family Medicine

## 2022-11-03 ENCOUNTER — Ambulatory Visit (INDEPENDENT_AMBULATORY_CARE_PROVIDER_SITE_OTHER): Payer: PPO | Admitting: Family Medicine

## 2022-11-03 VITALS — BP 114/60 | HR 61 | Temp 98.0°F | Ht 72.0 in | Wt 162.0 lb

## 2022-11-03 DIAGNOSIS — Z0000 Encounter for general adult medical examination without abnormal findings: Secondary | ICD-10-CM | POA: Diagnosis not present

## 2022-11-03 DIAGNOSIS — I10 Essential (primary) hypertension: Secondary | ICD-10-CM | POA: Diagnosis not present

## 2022-11-03 DIAGNOSIS — E1142 Type 2 diabetes mellitus with diabetic polyneuropathy: Secondary | ICD-10-CM | POA: Diagnosis not present

## 2022-11-03 DIAGNOSIS — E538 Deficiency of other specified B group vitamins: Secondary | ICD-10-CM | POA: Diagnosis not present

## 2022-11-03 DIAGNOSIS — F5101 Primary insomnia: Secondary | ICD-10-CM | POA: Diagnosis not present

## 2022-11-03 DIAGNOSIS — E782 Mixed hyperlipidemia: Secondary | ICD-10-CM

## 2022-11-03 DIAGNOSIS — G2581 Restless legs syndrome: Secondary | ICD-10-CM | POA: Diagnosis not present

## 2022-11-03 LAB — LIPID PANEL
Cholesterol: 134 mg/dL (ref 0–200)
HDL: 61.4 mg/dL (ref >39.00)
LDL Cholesterol: 59 mg/dL (ref 0–99)
NonHDL: 72.75
Total CHOL/HDL Ratio: 2
Triglycerides: 67 mg/dL (ref 0.0–149.0)
VLDL: 13.4 mg/dL (ref 0.0–40.0)

## 2022-11-03 LAB — CBC WITH DIFFERENTIAL/PLATELET
Basophils Absolute: 0 10*3/uL (ref 0.0–0.1)
Basophils Relative: 0.8 % (ref 0.0–3.0)
Eosinophils Absolute: 0.4 10*3/uL (ref 0.0–0.7)
Eosinophils Relative: 8.7 % — ABNORMAL HIGH (ref 0.0–5.0)
HCT: 37.3 % — ABNORMAL LOW (ref 39.0–52.0)
Hemoglobin: 13 g/dL (ref 13.0–17.0)
Lymphocytes Relative: 19.6 % (ref 12.0–46.0)
Lymphs Abs: 0.9 10*3/uL (ref 0.7–4.0)
MCHC: 34.8 g/dL (ref 30.0–36.0)
MCV: 93.8 fl (ref 78.0–100.0)
Monocytes Absolute: 0.3 10*3/uL (ref 0.1–1.0)
Monocytes Relative: 7.7 % (ref 3.0–12.0)
Neutro Abs: 2.8 10*3/uL (ref 1.4–7.7)
Neutrophils Relative %: 63.2 % (ref 43.0–77.0)
Platelets: 171 10*3/uL (ref 150.0–400.0)
RBC: 3.97 Mil/uL — ABNORMAL LOW (ref 4.22–5.81)
RDW: 12.8 % (ref 11.5–15.5)
WBC: 4.4 10*3/uL (ref 4.0–10.5)

## 2022-11-03 LAB — COMPREHENSIVE METABOLIC PANEL
ALT: 15 U/L (ref 0–53)
AST: 16 U/L (ref 0–37)
Albumin: 4.6 g/dL (ref 3.5–5.2)
Alkaline Phosphatase: 51 U/L (ref 39–117)
BUN: 30 mg/dL — ABNORMAL HIGH (ref 6–23)
CO2: 32 mEq/L (ref 19–32)
Calcium: 9.9 mg/dL (ref 8.4–10.5)
Chloride: 103 mEq/L (ref 96–112)
Creatinine, Ser: 1.15 mg/dL (ref 0.40–1.50)
GFR: 62.79 mL/min (ref >60.00)
Glucose, Bld: 125 mg/dL — ABNORMAL HIGH (ref 70–99)
Potassium: 4.2 mEq/L (ref 3.5–5.1)
Sodium: 142 mEq/L (ref 135–145)
Total Bilirubin: 0.9 mg/dL (ref 0.2–1.2)
Total Protein: 6.5 g/dL (ref 6.0–8.3)

## 2022-11-03 LAB — MICROALBUMIN / CREATININE URINE RATIO
Creatinine,U: 56.8 mg/dL
Microalb Creat Ratio: 1.2 mg/g (ref 0.0–30.0)
Microalb, Ur: <0.7 mg/dL (ref 0.0–1.9)

## 2022-11-03 LAB — HEMOGLOBIN A1C: Hgb A1c MFr Bld: 6.3 % (ref 4.6–6.5)

## 2022-11-03 LAB — VITAMIN B12: Vitamin B-12: 530 pg/mL (ref 211–911)

## 2022-11-03 NOTE — Patient Instructions (Signed)
Please return in 6 months for recheck.   I will release your lab results to you on your MyChart account with further instructions. You may see the results before I do, but when I review them I will send you a message with my report or have my assistant call you if things need to be discussed. Please reply to my message with any questions. Thank you!   If you have any questions or concerns, please don't hesitate to send me a message via MyChart or call the office at [phone]. Thank you for visiting with us today! It's our pleasure caring for you.  

## 2022-11-03 NOTE — Progress Notes (Signed)
Subjective  Chief Complaint  Patient presents with   Annual Exam    Pt here for Annual Exam and is not currently fasting    Diabetes    HPI: Shannon Hodge is a 77 y.o. male who presents to [facility] today for a Male Wellness Visit. He also has the concerns and/or needs as listed above in the chief complaint. These will be addressed in addition to the Health Maintenance Visit.   Wellness Visit: annual visit with health maintenance review and exam   Health maintenance: Screens are all current.  Healthy lifestyle.  Says he is doing well overall.  Body mass index is 21.97 kg/m. Wt Readings from Last 3 Encounters:  11/03/22 162 lb (73.5 kg)  09/13/22 162 lb 3.2 oz (73.6 kg)  08/24/22 165 lb (74.8 kg)     Chronic disease management visit and/or acute problem visit: Diabetes on metformin XR 2000 daily.  Tolerates well.  No symptoms of hyperglycemia.  Peripheral neuropathy persist.  No foot sores.  Eye exam current. Hypertension well-controlled on lisinopril 20 mg daily.  No chest pain shortness of breath. Hyperlipidemia maintained on simvastatin 40.  Tolerates well.  Fasting for recheck. 12 deficiency on oral supplements. Restless leg, peripheral neuropathy interfering with sleep.  Managed by neurology.  Reviewed records IBD/colitis with recent evaluation by gastroenterology, stabilizing on medicines. Spinal stenosis lumbar, considering peripheral nerve stimulator.  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   Vitamin B12 deficiency 09/21/2017   Allergic rhinitis 02/22/2016   Genital herpes 02/22/2016   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   Health Maintenance  Topic Date Due   Diabetic kidney evaluation - eGFR measurement  10/30/2022   Diabetic kidney evaluation - Urine ACR  10/30/2022   FOOT EXAM  10/30/2022   Medicare Annual Wellness (AWV)  01/15/2023   HEMOGLOBIN A1C  02/01/2023   INFLUENZA VACCINE  03/02/2023   OPHTHALMOLOGY EXAM  06/30/2023   COLONOSCOPY (Pts 45-57yrs Insurance coverage will need to be confirmed)  06/19/2030   Pneumonia Vaccine 79+ Years old  Completed   COVID-19 Vaccine  Completed   Hepatitis C Screening  Completed   Zoster Vaccines- Shingrix  Completed   HPV VACCINES  Aged Out   DTaP/Tdap/Td  Discontinued   Immunization History  Administered Date(s) Administered   COVID-19, mRNA, vaccine(Comirnaty)12 years and older 10/20/2022   Fluad Quad(high Dose 65+) 03/30/2020, 04/29/2022   Influenza, High Dose Seasonal PF 05/09/2016, 05/04/2017, 04/12/2018, 03/30/2020, 04/09/2021, 04/29/2022   Influenza,inj,Quad PF,6+ Mos 04/05/2019   Influenza-Unspecified 05/01/2015   PFIZER Comirnaty(Gray Top)Covid-19 Tri-Sucrose Vaccine 10/28/2020, 04/29/2022   PFIZER(Purple Top)SARS-COV-2 Vaccination 08/22/2019, 09/12/2019, 03/30/2020   Pfizer Covid-19 Vaccine Bivalent Booster 75yrs & up 04/09/2021, 11/23/2021   Pneumococcal Conjugate-13 10/03/2016   Pneumococcal Polysaccharide-23 01/27/2011, 09/21/2017   Tdap 01/27/2011   Zoster Recombinat (Shingrix) 06/25/2018, 09/06/2018   Zoster, Live 01/19/2010   We updated and reviewed the patient's past history in detail and it is documented below.  Allergies: Patient has No Known Allergies. Past Medical History  has a past medical history of Basal cell carcinoma, Cataract, Diabetes, Diabetic neuropathy, DJD  (degenerative joint disease), Hypercholesteremia, Hyperlipidemia, Hypertension, Inflammatory bowel disease (10/26/2020), Neuropathy, Ruptured Achilles tendon due to trauma (10/2016), and Ulcerative proctitis. Past Surgical History Patient  has a past surgical history that includes colonscopy; Colonoscopy with propofol (N/A, 11/01/2016); Esophagogastroduodenoscopy (egd) with propofol (N/A, 11/01/2016); Tonsillectomy; basil cell; and cortisone injection (04/2021). Social History Patient  reports that he has never smoked. He has never used smokeless tobacco. He reports that he does not drink alcohol and does not use drugs. Family History family history includes Brain cancer in his father; Diabetes in his father, maternal grandfather, maternal grandmother, paternal grandfather, and paternal grandmother; Healthy in his daughter and son; Pancreatic cancer in his mother; Stroke in his father. Review of Systems: Constitutional: negative for fever or malaise Ophthalmic: negative for photophobia, double vision or loss of vision Cardiovascular: negative for chest pain, dyspnea on exertion, or new LE swelling Respiratory: negative for SOB or persistent cough Gastrointestinal: negative for abdominal pain, change in bowel habits or melena Genitourinary: negative for dysuria or gross hematuria Musculoskeletal: negative for new gait disturbance or muscular weakness Integumentary: negative for new or persistent rashes Neurological: negative for TIA or stroke symptoms Psychiatric: negative for SI or delusions Allergic/Immunologic: negative for hives  Patient Care Team    Relationship Specialty Notifications Start End  [name], MD PCP - General Family Medicine  09/21/17   Penni Bombard, MD Consulting Physician Neurology  09/21/17   Garlan Fair, MD Consulting Physician Gastroenterology  09/21/17   Irine Seal, MD Attending Physician Urology  02/23/18   Marchia Bond, MD Consulting Physician  Orthopedic Surgery  09/05/18   Debbora Presto, NP Nurse Practitioner Neurology  10/07/19   Otelia Sergeant, [place] Physician Optometry  10/07/19   Edythe Clarity, [facility]  Pharmacist  08/31/21    Comment: [phone]  Thornton Park, MD Consulting Physician Gastroenterology  08/23/22    Objective  Vitals: BP 114/60   Pulse 61   Temp 98 F (36.7 C)   Ht 6' (1.829 m)   Wt 162 lb (73.5 kg)   SpO2 96%   BMI 21.97 kg/m  General:  Well developed, well nourished, no acute distress  Psych:  Alert and orientedx3,normal mood and affect HEENT:  Normocephalic, atraumatic, non-icteric sclera,  oropharynx is clear without mass or exudate, supple neck without adenopathy, or thyromegaly Cardiovascular:  Normal S1, S2, RRR without gallop, rub or murmur,  Respiratory:  Good breath sounds bilaterally, CTAB with normal respiratory effort Gastrointestinal: normal bowel sounds, soft, non-tender, no noted masses. No HSM MSK: Joints are without erythema or swelling.  Skin:  Warm, no rashes Neurologic:    Mental status is normal.  Gross motor and sensory exams are normal. Stable gait. + tremor   Assessment  1. Annual physical exam   2. Controlled type 2 diabetes mellitus with diabetic polyneuropathy, without long-term current use of insulin   3. Essential hypertension   4. Restless leg syndrome   5. Mixed hyperlipidemia   6. Diabetic polyneuropathy associated with type 2 diabetes mellitus   7. Primary insomnia   8. Vitamin B12 deficiency      Plan  Male Wellness Visit: Age appropriate Health Maintenance and Prevention measures were discussed with patient. Included topics are cancer screening recommendations, ways to keep healthy (see AVS) including dietary and exercise recommendations, regular eye and dental care, use  of seat belts, and avoidance of moderate alcohol use and tobacco use.  BMI: discussed patient's BMI and encouraged positive lifestyle modifications to help get to or maintain a target  BMI. HM needs and immunizations were addressed and ordered. See below for orders. See HM and immunization section for updates. Routine labs and screening tests ordered including cmp, cbc and lipids where appropriate. Discussed recommendations regarding Vit D and calcium supplementation (see AVS)  Chronic disease f/u and/or acute problem visit: (deemed necessary to be done in addition to the wellness visit): Hypertension well-controlled on lisinopril 20 mg daily.  Check renal function and electrolytes Diabetic control has been good.  No symptoms of hypoglycemia.  Recheck A1c today.  Urine microalbuminuria screening today.  Continue metformin 2000 daily Diabetic neuropathy without foot sores on high-dose gabapentin Recheck B12 levels Recheck lipids on simvastatin 40.  Has been at goal Back pain per orthopedics  Follow up: 6 months for recheck Commons side effects, risks, benefits, and alternatives for medications and treatment plan prescribed today were discussed, and the patient expressed understanding of the given instructions. Patient is instructed to call or message via MyChart if he/she has any questions or concerns regarding our treatment plan. No barriers to understanding were identified. We discussed Red Flag symptoms and signs in detail. Patient expressed understanding regarding what to do in case of urgent or emergency type symptoms.  Medication list was reconciled, printed and provided to the patient in AVS. Patient instructions and summary information was reviewed with the patient as documented in the AVS. This note was prepared with assistance of Dragon voice recognition software. Occasional wrong-word or sound-a-like substitutions may have occurred due to the inherent limitations of voice recognition software    Orders Placed This Encounter  Procedures   Hemoglobin A1c   CBC with Differential/Platelet   Comprehensive metabolic panel   Lipid panel   Microalbumin / creatinine  urine ratio   Vitamin B12   No orders of the defined types were placed in this encounter.

## 2022-11-11 ENCOUNTER — Encounter: Payer: Self-pay | Admitting: Family Medicine

## 2022-11-15 DIAGNOSIS — R2689 Other abnormalities of gait and mobility: Secondary | ICD-10-CM | POA: Diagnosis not present

## 2022-12-06 ENCOUNTER — Ambulatory Visit (INDEPENDENT_AMBULATORY_CARE_PROVIDER_SITE_OTHER): Payer: PPO | Admitting: Family Medicine

## 2022-12-06 ENCOUNTER — Encounter: Payer: Self-pay | Admitting: Family Medicine

## 2022-12-06 VITALS — BP 118/80 | HR 57 | Temp 98.4°F | Ht 72.0 in | Wt 164.2 lb

## 2022-12-06 DIAGNOSIS — M26621 Arthralgia of right temporomandibular joint: Secondary | ICD-10-CM

## 2022-12-06 NOTE — Progress Notes (Signed)
Subjective  CC:  Chief Complaint  Patient presents with   Jaw Pain   Ear Pain    Jaw pain, ear ache at night. Worse at night     HPI: Shannon Hodge is a 77 y.o. male who presents to the office today to address the problems listed above in the chief complaint. Complains of 1 to 2-week history of right jaw pain.  It started after a large yawn.  Now tender at times with chewing or yawning.  Painful to sleep on at night.  He is concerned about his ear.  No cold symptoms.  No allergy symptoms.  No ear trauma.  He has consulted with his dentist who thinks it was his jaw as well.  He has not taken any medicines Assessment  1. 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  Education given.  Advil for 1 week.  Follow-up.  Reassured, ear looks okay, mildly retracted.  Discussed Valsalva maneuver  Follow up: As scheduled 01/20/2023  No orders of the defined types were placed in this encounter.  No orders of the defined types were placed in this encounter.     I reviewed the patients updated PMH, FH, and SocHx.    Patient Active Problem List   Diagnosis Date Noted   Inflammatory bowel disease 10/26/2020    Priority: High   Essential hypertension 04/01/2020    Priority: High   Spinal stenosis of lumbar region 04/01/2020    Priority: High   Diabetic polyneuropathy associated with type 2 diabetes mellitus (HCC) 01/01/2019    Priority: High   Restless leg syndrome 09/21/2017    Priority: High   Mixed hyperlipidemia 09/21/2017    Priority: High   Controlled type 2 diabetes mellitus with diabetic polyneuropathy, without long-term current use of insulin (HCC) 09/01/2016    Priority: High   Insomnia 02/22/2016    Priority: High   Lumbar radiculopathy 11/04/2014    Priority: High   Hearing loss 09/21/2017    Priority: Medium    Muscle cramp, nocturnal 09/21/2017    Priority: Medium    Foot drop, left 09/21/2017    Priority: Medium    Low  testosterone 02/22/2016    Priority: Medium    Vitamin B12 deficiency 09/21/2017    Priority: Low   Allergic rhinitis 02/22/2016    Priority: Low   Genital herpes 02/22/2016    Priority: Low   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   Current Meds  Medication Sig   Alpha-Lipoic Acid 600 MG TABS Take by mouth.   Ascorbic Acid (VITAMIN C PO) Take by mouth.   Blood Glucose Monitoring Suppl (ONE TOUCH ULTRA 2) w/Device KIT USE AS DIRECTED   celecoxib (CELEBREX) 100 MG capsule Take 100 mg by mouth 2 (two) times daily.   cholecalciferol (VITAMIN D) 1000 units tablet Take 1,000 Units by mouth daily.   Coenzyme Q10 (COQ10) 100 MG CAPS Take 1 capsule by mouth daily.   Cyanocobalamin (VITAMIN B-12 PO) Take by mouth daily.   cyclobenzaprine (FLEXERIL) 5 MG tablet Take 5 mg by mouth at bedtime as needed.   fenofibrate micronized (LOFIBRA) 134 MG capsule TAKE 1 CAPSULE BY MOUTH EVERY DAY BEFORE BREAKFAST   Ferrous Sulfate (IRON PO) Take by mouth.   gabapentin (NEURONTIN) 600 MG tablet Take 1 tablet (600 mg total) by mouth 4 (four) times daily as needed.   glucose blood (FREESTYLE LITE) test strip Use as instructed   Lancets (ONETOUCH ULTRASOFT) lancets  Use as instructed   lisinopril (ZESTRIL) 20 MG tablet TAKE 1 TABLET BY MOUTH EVERY DAY   Magnesium Oxide 500 MG TABS Take by mouth.   mesalamine (LIALDA) 1.2 g EC tablet TAKE 4 TABLETS BY MOUTH DAILY WITH BREAKFAST.   metFORMIN (GLUCOPHAGE-XR) 500 MG 24 hr tablet TAKE 4 TABLETS (2,000 MG TOTAL) BY MOUTH DAILY   simvastatin (ZOCOR) 40 MG tablet TAKE 1 TABLET BY MOUTH EVERYDAY AT BEDTIME   tiZANidine (ZANAFLEX) 2 MG tablet TAKE 1 TABLET BY MOUTH EVERY 8 HOURS AS NEEDED FOR UP TO 30 DAYS.   valACYclovir (VALTREX) 500 MG tablet Take 1 tablet (500 mg total) by mouth daily as needed (outbreak).    Allergies: Patient has No Known Allergies. Family History: Patient family history includes Brain cancer in his father; Diabetes in his father, maternal grandfather, maternal  grandmother, paternal grandfather, and paternal grandmother; Healthy in his daughter and son; Pancreatic cancer in his mother; Stroke in his father. Social History:  Patient  reports that he has never smoked. He has never used smokeless tobacco. He reports that he does not drink alcohol and does not use drugs.  Review of Systems: Constitutional: Negative for fever malaise or anorexia Cardiovascular: negative for chest pain Respiratory: negative for SOB or persistent cough Gastrointestinal: negative for abdominal pain  Objective  Vitals: BP 118/80   Pulse (!) 57   Temp 98.4 F (36.9 C)   Ht 6' (1.829 m)   Wt 164 lb 3.2 oz (74.5 kg)   SpO2 99%   BMI 22.27 kg/m  General: no acute distress , A&Ox3 HEENT: Right TMJ is tender, fairly good range of motion.  No temporal artery tenderness, right TM is retracted  Commons side effects, risks, benefits, and alternatives for medications and treatment plan prescribed today were discussed, and the patient expressed understanding of the given instructions. Patient is instructed to call or message via MyChart if he/she has any questions or concerns regarding our treatment plan. No barriers to understanding were identified. We discussed Red Flag symptoms and signs in detail. Patient expressed understanding regarding what to do in case of urgent or emergency type symptoms.  Medication list was reconciled, printed and provided to the patient in AVS. Patient instructions and summary information was reviewed with the patient as documented in the AVS. This note was prepared with assistance of Dragon voice recognition software. Occasional wrong-word or sound-a-like substitutions may have occurred due to the inherent limitations of voice recognition software

## 2022-12-06 NOTE — Patient Instructions (Signed)
Please follow up if symptoms do not improve or as needed.    Temporomandibular Joint Syndrome  Temporomandibular joint syndrome (TMJ syndrome) is a condition that causes pain in the temporomandibular joints. These joints are located near your ears and allow your jaw to open and close. For people with TMJ syndrome, chewing, biting, or other movements of the jaw can be difficult or painful. TMJ syndrome is often mild and goes away within a few weeks. However, sometimes the condition becomes a long-term (chronic) problem. What are the causes? This condition may be caused by: Grinding your teeth or clenching your jaw. Some people do this when they are stressed. Arthritis. An injury to the jaw. A head or neck injury. Teeth or dentures that are not aligned well. In some cases, the cause of TMJ syndrome may not be known. What are the signs or symptoms? The most common symptom of this condition is aching pain on the side of the head in the area of the TMJ. Other symptoms may include: Pain when moving your jaw, such as when chewing or biting. Not being able to open your jaw all the way. Making a clicking sound when you open your mouth. Headache. Earache. Neck or shoulder pain. How is this diagnosed? This condition may be diagnosed based on: Your symptoms and medical history. A physical exam. Your health care provider may check the range of motion of your jaw. Imaging tests, such as X-rays or an MRI. You may also need to see your dentist, who will check if your teeth and jaw are lined up correctly. How is this treated? TMJ syndrome often goes away on its own. If treatment is needed, it may include: Eating soft foods and applying ice or heat. Medicines to relieve pain or inflammation. Medicines or massage to relax the muscles. A splint, bite plate, or mouthpiece to prevent teeth grinding or jaw clenching. Relaxation techniques or counseling to help reduce stress. A therapy for pain in which  an electrical current is applied to the nerves through the skin (transcutaneous electrical nerve stimulation). Acupuncture. This may help to relieve pain. Jaw surgery. This is rarely needed. Follow these instructions at home:  Eating and drinking Eat a soft diet if you are having trouble chewing. Avoid foods that require a lot of chewing. Do not chew gum. General instructions Take over-the-counter and prescription medicines only as told by your health care provider. If directed, put ice on the painful area. To do this: Put ice in a plastic bag. Place a towel between your skin and the bag. Leave the ice on for 20 minutes, 2-3 times a day. Remove the ice if your skin turns bright red. This is very important. If you cannot feel pain, heat, or cold, you have a greater risk of damage to the area. Apply a warm, wet cloth (warm compress) to the painful area as told. Massage your jaw area and do any jaw stretching exercises as told by your health care provider. If you were given a splint, bite plate, or mouthpiece, wear it as told by your health care provider. Keep all follow-up visits. This is important. Where to find more information [employer]: [employer] Contact a health care provider if: You have trouble eating. You have new or worsening symptoms. Get help right away if: Your jaw locks. Summary Temporomandibular joint syndrome (TMJ syndrome) is a condition that causes pain in the temporomandibular joints. These joints are located near your ears and allow your  jaw to open and close. TMJ syndrome is often mild and goes away within a few weeks. However, sometimes the condition becomes a long-term (chronic) problem. Symptoms include an aching pain on the side of the head in the area of the TMJ, pain when chewing or biting, and being unable to open your jaw all the way. You may also make a clicking sound when you open your mouth. TMJ syndrome  often goes away on its own. If treatment is needed, it may include medicines to relieve pain, reduce inflammation, or relax the muscles. A splint, bite plate, or mouthpiece may also be used to prevent teeth grinding or jaw clenching. This information is not intended to replace advice given to you by your health care provider. Make sure you discuss any questions you have with your health care provider. Document Revised: 02/28/2021 Document Reviewed: 02/28/2021 Elsevier Patient Education  2023 [employer].

## 2023-01-10 ENCOUNTER — Ambulatory Visit: Payer: PPO

## 2023-01-10 VITALS — Wt 164.0 lb

## 2023-01-10 DIAGNOSIS — Z0000 Encounter for general adult medical examination without abnormal findings: Secondary | ICD-10-CM | POA: Diagnosis not present

## 2023-01-10 NOTE — Progress Notes (Signed)
I connected with  Shannon Hodge on 01/10/23 by a audio enabled telemedicine application and verified that I am speaking with the correct person using two identifiers.  Patient Location: Home  Provider Location: Office/Clinic  I discussed the limitations of evaluation and management by telemedicine. The patient expressed understanding and agreed to proceed.  Patient Medicare AWV questionnaire was completed by the patient on 01/06/23; I have confirmed that all information answered by patient is correct and no changes since this date.     Subjective:   Shannon Hodge is a 77 y.o. male who presents for Medicare Annual/Subsequent preventive examination.  Review of Systems     Cardiac Risk Factors include: advanced age (>57men, >94 women);diabetes mellitus;dyslipidemia;hypertension;male gender     Objective:    Today's Vitals   01/10/23 1333  Weight: 164 lb (74.4 kg)   Body mass index is 22.24 kg/m.     01/10/2023    1:40 PM 01/14/2022    9:55 AM 01/08/2021    9:44 AM 10/07/2019    2:46 PM 09/05/2018    9:39 AM 11/01/2016    9:40 AM 10/27/2016    2:46 PM  Advanced Directives  Does Patient Have a Medical Advance Directive? Yes Yes Yes Yes Yes Yes Yes  Type of Estate agent of [place];Living will Healthcare Power of Attorney Living will Living will;Healthcare Power of [employer] Power of [place];Living will Healthcare Power of [place];Living will Healthcare Power of [place];Living will  Does patient want to make changes to medical advance directive?    No - Patient declined   No - Patient declined  Copy of Healthcare Power of Attorney in Chart? No - copy requested No - copy requested  No - copy requested No - copy requested No - copy requested     Current Medications (verified) Outpatient Encounter Medications as of 01/10/2023  Medication Sig   Alpha-Lipoic Acid 600 MG TABS Take by mouth.   Ascorbic Acid (VITAMIN C PO) Take by mouth.   Blood Glucose Monitoring  Suppl (ONE TOUCH ULTRA 2) w/Device KIT USE AS DIRECTED   celecoxib (CELEBREX) 100 MG capsule Take 100 mg by mouth 2 (two) times daily.   cholecalciferol (VITAMIN D) 1000 units tablet Take 1,000 Units by mouth daily.   Coenzyme Q10 (COQ10) 100 MG CAPS Take 1 capsule by mouth daily.   Cyanocobalamin (VITAMIN B-12 PO) Take by mouth daily.   cyclobenzaprine (FLEXERIL) 5 MG tablet Take 5 mg by mouth at bedtime as needed.   fenofibrate micronized (LOFIBRA) 134 MG capsule TAKE 1 CAPSULE BY MOUTH EVERY DAY BEFORE BREAKFAST   Ferrous Sulfate (IRON PO) Take by mouth.   gabapentin (NEURONTIN) 600 MG tablet Take 1 tablet (600 mg total) by mouth 4 (four) times daily as needed.   glucose blood (FREESTYLE LITE) test strip Use as instructed   Lancets (ONETOUCH ULTRASOFT) lancets Use as instructed   lisinopril (ZESTRIL) 20 MG tablet TAKE 1 TABLET BY MOUTH EVERY DAY   Magnesium Oxide 500 MG TABS Take by mouth.   mesalamine (LIALDA) 1.2 g EC tablet TAKE 4 TABLETS BY MOUTH DAILY WITH BREAKFAST.   metFORMIN (GLUCOPHAGE-XR) 500 MG 24 hr tablet TAKE 4 TABLETS (2,000 MG TOTAL) BY MOUTH DAILY   simvastatin (ZOCOR) 40 MG tablet TAKE 1 TABLET BY MOUTH EVERYDAY AT BEDTIME   tiZANidine (ZANAFLEX) 2 MG tablet TAKE 1 TABLET BY MOUTH EVERY 8 HOURS AS NEEDED FOR UP TO 30 DAYS.   valACYclovir (VALTREX) 500 MG tablet Take 1 tablet (500  mg total) by mouth daily as needed (outbreak).   No facility-administered encounter medications on file as of 01/10/2023.    Allergies (verified) Patient has no known allergies.   History: Past Medical History:  Diagnosis Date   Basal cell carcinoma    upper back   Cataract    right eye   Diabetes (HCC)    type 2   Diabetic neuropathy (HCC)    both legs   DJD (degenerative joint disease)    L 5   Hypercholesteremia    Hyperlipidemia    Hypertension    Inflammatory bowel disease 10/26/2020   Unspecified. Seeing [facility]   Neuropathy    Ruptured Achilles tendon due to trauma  10/2016   Ulcerative proctitis [facility])    Past Surgical History:  Procedure Laterality Date   basil cell     basil cell/mole removed from back   COLONOSCOPY WITH PROPOFOL N/A 11/01/2016   Procedure: COLONOSCOPY WITH PROPOFOL;  Surgeon: Charolett Bumpers, MD;  Location: [facility];  Service: Endoscopy;  Laterality: N/A;   colonscopy     x 2   cortisone injection  04/2021   lower back   ESOPHAGOGASTRODUODENOSCOPY (EGD) WITH PROPOFOL N/A 11/01/2016   Procedure: ESOPHAGOGASTRODUODENOSCOPY (EGD) WITH PROPOFOL;  Surgeon: Charolett Bumpers, MD;  Location: [facility];  Service: Endoscopy;  Laterality: N/A;   TONSILLECTOMY     Family History  Problem Relation Age of Onset   Pancreatic cancer Mother    Stroke Father    Brain cancer Father    Diabetes Father    Healthy Daughter    Healthy Son    Diabetes Maternal Grandmother    Diabetes Maternal Grandfather    Diabetes Paternal Grandmother    Diabetes Paternal Grandfather    Colon cancer Neg Hx    Colon polyps Neg Hx    Esophageal cancer Neg Hx    Stomach cancer Neg Hx    Social History   Socioeconomic History   Marital status: Married    Spouse name: Diane    Number of children: 3   Years of education: post grad   Highest education level: Not on file  Occupational History   Occupation: retired Charity fundraiser  Tobacco Use   Smoking status: Never   Smokeless tobacco: Never  Vaping Use   Vaping Use: Never used  Substance and Sexual Activity   Alcohol use: No   Drug use: No   Sexual activity: Not Currently  Other Topics Concern   Not on file  Social History Narrative   Lives with wife    Drinks 2 cups of coffee a day   Social Determinants of Health   Financial Resource Strain: Low Risk  (01/06/2023)   Overall Financial Resource Strain (CARDIA)    Difficulty of Paying Living Expenses: Not hard at all  Food Insecurity: No Food Insecurity (01/06/2023)   Hunger Vital Sign    Worried About Running Out of Food in the Last Year:  Never true    Ran Out of Food in the Last Year: Never true  Transportation Needs: No Transportation Needs (01/06/2023)   PRAPARE - Administrator, Civil Service (Medical): No    Lack of Transportation (Non-Medical): No  Physical Activity: Insufficiently Active (01/06/2023)   Exercise Vital Sign    Days of Exercise per Week: 3 days    Minutes of Exercise per Session: 30 min  Stress: No Stress Concern Present (01/06/2023)   [employer] of Occupational Health - Occupational  Stress Questionnaire    Feeling of Stress : Only a little  Social Connections: Moderately Isolated (01/06/2023)   Social Connection and Isolation Panel [NHANES]    Frequency of Communication with Friends and Family: Three times a week    Frequency of Social Gatherings with Friends and Family: Three times a week    Attends Religious Services: Never    Active Member of Clubs or Organizations: No    Attends Banker Meetings: Never    Marital Status: Married    Tobacco Counseling Counseling given: Not Answered   Clinical Intake:  Pre-visit preparation completed: Yes  Pain : No/denies pain     BMI - recorded: 22.24 Nutritional Status: BMI of 19-24  Normal Nutritional Risks: None Diabetes: Yes CBG done?: Yes (95 per pt) CBG resulted in Enter/ Edit results?: No Did pt. bring in CBG monitor from home?: No  How often do you need to have someone help you when you read instructions, pamphlets, or other written materials from your doctor or pharmacy?: 1 - Never  Diabetic?Nutrition Risk Assessment:  Has the patient had any N/V/D within the last 2 months?  No  Does the patient have any non-healing wounds?  No  Has the patient had any unintentional weight loss or weight gain?  No   Diabetes:  Is the patient diabetic?  Yes  If diabetic, was a CBG obtained today?  Yes  Did the patient bring in their glucometer from home?  No  How often do you monitor your CBG's? Three times a week .    Financial Strains and Diabetes Management:  Are you having any financial strains with the device, your supplies or your medication? No .  Does the patient want to be seen by Chronic Care Management for management of their diabetes?   no Would the patient like to be referred to a Nutritionist or for Diabetic Management?  No   Diabetic Exams:   Diabetic Eye Exam: Completed 06/29/22 Diabetic Foot Exam: Overdue, Pt has been advised about the importance in completing this exam. Pt is scheduled for diabetic foot exam on next appt .   Interpreter Needed?: No  Information entered by :: Lanier Ensign, LPN   Activities of Daily Living    01/06/2023    8:50 AM 01/14/2022    9:57 AM  In your present state of health, do you have any difficulty performing the following activities:  Hearing? 1 1  Comment hearing aids wears hearing aid  Vision? 0 0  Difficulty concentrating or making decisions? 0 0  Walking or climbing stairs? 0   Dressing or bathing? 0 0  Doing errands, shopping? 0 0  Preparing Food and eating ? N N  Using the Toilet? N N  In the past six months, have you accidently leaked urine? N N  Do you have problems with loss of bowel control? N N  Managing your Medications? N N  Managing your Finances? N N  Housekeeping or managing your Housekeeping? N N    Patient Care Team: Willow Ora, MD as PCP - General (Family Medicine) Marjory Lies, Glenford Bayley, MD as Consulting Physician (Neurology) Charolett Bumpers, MD as Consulting Physician (Gastroenterology) Bjorn Pippin, MD as Attending Physician (Urology) Teryl Lucy, MD as Consulting Physician (Orthopedic Surgery) Shawnie Dapper, NP as Nurse Practitioner (Neurology) Jill Side, OD as Consulting Physician (Optometry) Erroll Luna, [facility] (Pharmacist) Tressia Danas, MD as Consulting Physician (Gastroenterology)  Indicate any recent Medical Services you may have received  from other than [facility] providers in the past year  (date may be approximate).     Assessment:   This is a routine wellness examination for [place].  Hearing/Vision screen Hearing Screening - Comments:: Pt wears hearing aids  Vision Screening - Comments:: Pt follows up with [facility] for annual eye exams   Dietary issues and exercise activities discussed: Current Exercise Habits: Home exercise routine, Type of exercise: walking;Other - see comments, Time (Minutes): 30, Frequency (Times/Week): 3, Weekly Exercise (Minutes/Week): 90   Goals Addressed             This Visit's Progress    Patient Stated       To be able to exercise more related to back pain        Depression Screen    01/10/2023    1:38 PM 12/06/2022   10:50 AM 11/03/2022    9:01 AM 08/03/2022   10:06 AM 01/14/2022    9:50 AM 01/08/2021    9:41 AM 10/26/2020    8:06 AM  PHQ 2/9 Scores  PHQ - 2 Score 0 0 1 0 1 0 0  PHQ- 9 Score     3      Fall Risk    01/06/2023    8:50 AM 12/06/2022   10:50 AM 11/03/2022    9:01 AM 08/03/2022   10:06 AM 01/14/2022    9:56 AM  Fall Risk   Falls in the past year? 1 0 1 0 0  Number falls in past yr: 0 0 0 0 0  Injury with Fall? 0 0 0 0 0  Risk for fall due to : Impaired vision No Fall Risks No Fall Risks No Fall Risks Impaired vision;Impaired balance/gait  Follow up Falls prevention discussed Falls evaluation completed Falls evaluation completed Falls evaluation completed Falls prevention discussed    FALL RISK PREVENTION PERTAINING TO THE HOME:  Any stairs in or around the home? Yes  If so, are there any without handrails? No  Home free of loose throw rugs in walkways, pet beds, electrical cords, etc? Yes  Adequate lighting in your home to reduce risk of falls? Yes   ASSISTIVE DEVICES UTILIZED TO PREVENT FALLS:  Life alert? No  Use of a cane, walker or w/c? No  Grab bars in the bathroom? Yes  Shower chair or bench in shower? Yes  Elevated toilet seat or a handicapped toilet? No   TIMED UP AND GO:  Was the test  performed? No .      Cognitive Function:    09/05/2018    9:40 AM  MMSE - Mini Mental State Exam  Orientation to time 5  Orientation to Place 5  Registration 3  Attention/ Calculation 5  Recall 3  Language- name 2 objects 2  Language- repeat 1  Language- follow 3 step command 3  Language- read & follow direction 1  Write a sentence 1  Copy design 1  Total score 30        01/10/2023    1:42 PM 01/14/2022    9:59 AM 01/08/2021    9:48 AM 10/07/2019    2:47 PM  6CIT Screen  What Year? 0 points 0 points 0 points 0 points  What month? 0 points 0 points 0 points 0 points  What time? 0 points 0 points 0 points 0 points  Count back from 20 0 points 0 points 0 points 0 points  Months in reverse 0 points 0 points 0 points 0  points  Repeat phrase 0 points 0 points 0 points 0 points  Total Score 0 points 0 points 0 points 0 points    Immunizations Immunization History  Administered Date(s) Administered   COVID-19, mRNA, vaccine(Comirnaty)12 years and older 10/20/2022   Fluad Quad(high Dose 65+) 03/30/2020, 04/29/2022   Influenza, High Dose Seasonal PF 05/09/2016, 05/04/2017, 04/12/2018, 03/30/2020, 04/09/2021, 04/29/2022   Influenza,inj,Quad PF,6+ Mos 04/05/2019   Influenza-Unspecified 05/01/2015   PFIZER Comirnaty(Gray Top)Covid-19 Tri-Sucrose Vaccine 10/28/2020, 04/29/2022   PFIZER(Purple Top)SARS-COV-2 Vaccination 08/22/2019, 09/12/2019, 03/30/2020   Pfizer Covid-19 Vaccine Bivalent Booster 36yrs & up 04/09/2021, 11/23/2021   Pneumococcal Conjugate-13 10/03/2016   Pneumococcal Polysaccharide-23 01/27/2011, 09/21/2017   Respiratory Syncytial Virus Vaccine,Recomb Aduvanted(Arexvy) 08/28/2022   Tdap 01/27/2011   Zoster Recombinat (Shingrix) 06/25/2018, 09/06/2018   Zoster, Live 01/19/2010    TDAP status: Up to date  Flu Vaccine status: Up to date  Pneumococcal vaccine status: Up to date  Covid-19 vaccine status: Completed vaccines  Qualifies for Shingles Vaccine? Yes    Zostavax completed Yes   Shingrix Completed?: Yes  Screening Tests Health Maintenance  Topic Date Due   FOOT EXAM  10/30/2022   INFLUENZA VACCINE  03/02/2023   HEMOGLOBIN A1C  05/05/2023   OPHTHALMOLOGY EXAM  06/30/2023   Diabetic kidney evaluation - eGFR measurement  11/03/2023   Diabetic kidney evaluation - Urine ACR  11/03/2023   Medicare Annual Wellness (AWV)  01/10/2024   Colonoscopy  06/19/2030   Pneumonia Vaccine 65+ Years old  Completed   COVID-19 Vaccine  Completed   Hepatitis C Screening  Completed   Zoster Vaccines- Shingrix  Completed   HPV VACCINES  Aged Out   DTaP/Tdap/Td  Discontinued    Health Maintenance  Health Maintenance Due  Topic Date Due   FOOT EXAM  10/30/2022    Colorectal cancer screening: Type of screening: Colonoscopy. Completed 06/19/20. Repeat every 10 years  Additional Screening:  Hepatitis C Screening:  Completed 02/23/18  Vision Screening: Recommended annual ophthalmology exams for early detection of glaucoma and other disorders of the eye. Is the patient up to date with their annual eye exam?  Yes  Who is the provider or what is the name of the office in which the patient attends annual eye exams? [facility]  If pt is not established with a provider, would they like to be referred to a provider to establish care? No .   Dental Screening: Recommended annual dental exams for proper oral hygiene  Community Resource Referral / Chronic Care Management: CRR required this visit?  No   CCM required this visit?  No      Plan:     I have personally reviewed and noted the following in the patient's chart:   Medical and social history Use of alcohol, tobacco or illicit drugs  Current medications and supplements including opioid prescriptions. Patient is not currently taking opioid prescriptions. Functional ability and status Nutritional status Physical activity Advanced directives List of other physicians Hospitalizations,  surgeries, and ER visits in previous 12 months Vitals Screenings to include cognitive, depression, and falls Referrals and appointments  In addition, I have reviewed and discussed with patient certain preventive protocols, quality metrics, and best practice recommendations. A written personalized care plan for preventive services as well as general preventive health recommendations were provided to patient.     Marzella Schlein, LPN   11/07/8117   Nurse Notes: none

## 2023-01-10 NOTE — Patient Instructions (Signed)
[name] , Thank you for taking time to come for your Medicare Wellness Visit. I appreciate your ongoing commitment to your health goals. Please review the following plan we discussed and let me know if I can assist you in the future.   These are the goals we discussed:  Goals      Monitor and Manage My Blood Sugar-Diabetes Type 2     Timeframe:  Long-Range Goal Priority:  High Start Date: 02/22/21                            Expected End Date:  08/25/21                     Follow Up Date 05/31/21    - check blood sugar at prescribed times - check blood sugar before and after exercise - take the blood sugar log to all doctor visits    Why is this important?   Checking your blood sugar at home helps to keep it from getting very high or very low.  Writing the results in a diary or log helps the doctor know how to care for you.  Your blood sugar log should have the time, date and the results.  Also, write down the amount of insulin or other medicine that you take.  Other information, like what you ate, exercise done and how you were feeling, will also be helpful.     Notes:      Patient Stated     Maintain current health status staying active.      Patient Stated     Regulate A1C      PATIENT STATED     GET CONSISTENT SLEEP     Patient Stated     To be able to exercise more related to back pain         This is a list of the screening recommended for you and due dates:  Health Maintenance  Topic Date Due   Complete foot exam   10/30/2022   Flu Shot  03/02/2023   Hemoglobin A1C  05/05/2023   Eye exam for diabetics  06/30/2023   Yearly kidney function blood test for diabetes  11/03/2023   Yearly kidney health urinalysis for diabetes  11/03/2023   Medicare Annual Wellness Visit  01/10/2024   Colon Cancer Screening  06/19/2030   Pneumonia Vaccine  Completed   COVID-19 Vaccine  Completed   Hepatitis C Screening  Completed   Zoster (Shingles) Vaccine  Completed   HPV  Vaccine  Aged Out   DTaP/Tdap/Td vaccine  Discontinued    Advanced directives: Please bring a copy of your health care power of attorney and living will to the office at your convenience.  Conditions/risks identified: get more exercise in related to back pain   Next appointment: Follow up in one year for your annual wellness visit.   Preventive Care 32 Years and Older, Male  Preventive care refers to lifestyle choices and visits with your health care provider that can promote health and wellness. What does preventive care include? A yearly physical exam. This is also called an annual well check. Dental exams once or twice a year. Routine eye exams. Ask your health care provider how often you should have your eyes checked. Personal lifestyle choices, including: Daily care of your teeth and gums. Regular physical activity. Eating a healthy diet. Avoiding tobacco and drug use. Limiting alcohol use. Practicing  safe sex. Taking low doses of aspirin every day. Taking vitamin and mineral supplements as recommended by your health care provider. What happens during an annual well check? The services and screenings done by your health care provider during your annual well check will depend on your age, overall health, lifestyle risk factors, and family history of disease. Counseling  Your health care provider may ask you questions about your: Alcohol use. Tobacco use. Drug use. Emotional well-being. Home and relationship well-being. Sexual activity. Eating habits. History of falls. Memory and ability to understand (cognition). Work and work Astronomer. Screening  You may have the following tests or measurements: Height, weight, and BMI. Blood pressure. Lipid and cholesterol levels. These may be checked every 5 years, or more frequently if you are over 27 years old. Skin check. Lung cancer screening. You may have this screening every year starting at age 31 if you have a  30-pack-year history of smoking and currently smoke or have quit within the past 15 years. Fecal occult blood test (FOBT) of the stool. You may have this test every year starting at age 74. Flexible sigmoidoscopy or colonoscopy. You may have a sigmoidoscopy every 5 years or a colonoscopy every 10 years starting at age 50. Prostate cancer screening. Recommendations will vary depending on your family history and other risks. Hepatitis C blood test. Hepatitis B blood test. Sexually transmitted disease (STD) testing. Diabetes screening. This is done by checking your blood sugar (glucose) after you have not eaten for a while (fasting). You may have this done every 1-3 years. Abdominal aortic aneurysm (AAA) screening. You may need this if you are a current or former smoker. Osteoporosis. You may be screened starting at age 34 if you are at high risk. Talk with your health care provider about your test results, treatment options, and if necessary, the need for more tests. Vaccines  Your health care provider may recommend certain vaccines, such as: Influenza vaccine. This is recommended every year. Tetanus, diphtheria, and acellular pertussis (Tdap, Td) vaccine. You may need a Td booster every 10 years. Zoster vaccine. You may need this after age 69. Pneumococcal 13-valent conjugate (PCV13) vaccine. One dose is recommended after age 76. Pneumococcal polysaccharide (PPSV23) vaccine. One dose is recommended after age 47. Talk to your health care provider about which screenings and vaccines you need and how often you need them. This information is not intended to replace advice given to you by your health care provider. Make sure you discuss any questions you have with your health care provider. Document Released: 08/14/2015 Document Revised: 04/06/2016 Document Reviewed: 05/19/2015 Elsevier Interactive Patient Education  2017 [employer].  Fall Prevention in the Home Falls can cause injuries. They  can happen to people of all ages. There are many things you can do to make your home safe and to help prevent falls. What can I do on the outside of my home? Regularly fix the edges of walkways and driveways and fix any cracks. Remove anything that might make you trip as you walk through a door, such as a raised step or threshold. Trim any bushes or trees on the path to your home. Use bright outdoor lighting. Clear any walking paths of anything that might make someone trip, such as rocks or tools. Regularly check to see if handrails are loose or broken. Make sure that both sides of any steps have handrails. Any raised decks and porches should have guardrails on the edges. Have any leaves, snow, or ice cleared  regularly. Use sand or salt on walking paths during winter. Clean up any spills in your garage right away. This includes oil or grease spills. What can I do in the bathroom? Use night lights. Install grab bars by the toilet and in the tub and shower. Do not use towel bars as grab bars. Use non-skid mats or decals in the tub or shower. If you need to sit down in the shower, use a plastic, non-slip stool. Keep the floor dry. Clean up any water that spills on the floor as soon as it happens. Remove soap buildup in the tub or shower regularly. Attach bath mats securely with double-sided non-slip rug tape. Do not have throw rugs and other things on the floor that can make you trip. What can I do in the bedroom? Use night lights. Make sure that you have a light by your bed that is easy to reach. Do not use any sheets or blankets that are too big for your bed. They should not hang down onto the floor. Have a firm chair that has side arms. You can use this for support while you get dressed. Do not have throw rugs and other things on the floor that can make you trip. What can I do in the kitchen? Clean up any spills right away. Avoid walking on wet floors. Keep items that you use a lot in  easy-to-reach places. If you need to reach something above you, use a strong step stool that has a grab bar. Keep electrical cords out of the way. Do not use floor polish or wax that makes floors slippery. If you must use wax, use non-skid floor wax. Do not have throw rugs and other things on the floor that can make you trip. What can I do with my stairs? Do not leave any items on the stairs. Make sure that there are handrails on both sides of the stairs and use them. Fix handrails that are broken or loose. Make sure that handrails are as long as the stairways. Check any carpeting to make sure that it is firmly attached to the stairs. Fix any carpet that is loose or worn. Avoid having throw rugs at the top or bottom of the stairs. If you do have throw rugs, attach them to the floor with carpet tape. Make sure that you have a light switch at the top of the stairs and the bottom of the stairs. If you do not have them, ask someone to add them for you. What else can I do to help prevent falls? Wear shoes that: Do not have high heels. Have rubber bottoms. Are comfortable and fit you well. Are closed at the toe. Do not wear sandals. If you use a stepladder: Make sure that it is fully opened. Do not climb a closed stepladder. Make sure that both sides of the stepladder are locked into place. Ask someone to hold it for you, if possible. Clearly mark and make sure that you can see: Any grab bars or handrails. First and last steps. Where the edge of each step is. Use tools that help you move around (mobility aids) if they are needed. These include: Canes. Walkers. Scooters. Crutches. Turn on the lights when you go into a dark area. Replace any light bulbs as soon as they burn out. Set up your furniture so you have a clear path. Avoid moving your furniture around. If any of your floors are uneven, fix them. If there are any pets around you,  be aware of where they are. Review your medicines  with your doctor. Some medicines can make you feel dizzy. This can increase your chance of falling. Ask your doctor what other things that you can do to help prevent falls. This information is not intended to replace advice given to you by your health care provider. Make sure you discuss any questions you have with your health care provider. Document Released: 05/14/2009 Document Revised: 12/24/2015 Document Reviewed: 08/22/2014 Elsevier Interactive Patient Education  2017 [employer].

## 2023-01-11 DIAGNOSIS — E1142 Type 2 diabetes mellitus with diabetic polyneuropathy: Secondary | ICD-10-CM | POA: Diagnosis not present

## 2023-01-11 DIAGNOSIS — G894 Chronic pain syndrome: Secondary | ICD-10-CM | POA: Diagnosis not present

## 2023-01-11 DIAGNOSIS — M47816 Spondylosis without myelopathy or radiculopathy, lumbar region: Secondary | ICD-10-CM | POA: Diagnosis not present

## 2023-02-14 ENCOUNTER — Telehealth: Payer: Self-pay | Admitting: Family Medicine

## 2023-02-14 NOTE — Telephone Encounter (Signed)
Patient states he received a letter from [facility] that was described as a notification of payment denial for his medicare AWV from 01/10/23. States they informed him via letter that due to the coding from the AWV they won't cover it.   Per the letter the procedure code was [phone] and the denial code was [id number].  I advised patient to call and see which code they would accept but he stated he doesn't feel like this is his place to handle. Please Advise.

## 2023-02-19 ENCOUNTER — Encounter: Payer: Self-pay | Admitting: Family Medicine

## 2023-02-21 DIAGNOSIS — R310 Gross hematuria: Secondary | ICD-10-CM | POA: Diagnosis not present

## 2023-02-23 DIAGNOSIS — N3289 Other specified disorders of bladder: Secondary | ICD-10-CM | POA: Diagnosis not present

## 2023-02-23 DIAGNOSIS — R310 Gross hematuria: Secondary | ICD-10-CM | POA: Diagnosis not present

## 2023-02-23 DIAGNOSIS — K7689 Other specified diseases of liver: Secondary | ICD-10-CM | POA: Diagnosis not present

## 2023-03-02 HISTORY — PX: OTHER SURGICAL HISTORY: SHX169

## 2023-03-03 DIAGNOSIS — G629 Polyneuropathy, unspecified: Secondary | ICD-10-CM | POA: Diagnosis not present

## 2023-03-03 DIAGNOSIS — G5603 Carpal tunnel syndrome, bilateral upper limbs: Secondary | ICD-10-CM | POA: Diagnosis not present

## 2023-03-03 DIAGNOSIS — G894 Chronic pain syndrome: Secondary | ICD-10-CM | POA: Diagnosis not present

## 2023-03-13 ENCOUNTER — Encounter: Payer: Self-pay | Admitting: Family Medicine

## 2023-04-14 ENCOUNTER — Encounter: Payer: PPO | Admitting: Pharmacist

## 2023-04-17 ENCOUNTER — Encounter: Payer: Self-pay | Admitting: Family Medicine

## 2023-05-30 ENCOUNTER — Encounter: Payer: Self-pay | Admitting: Family Medicine

## 2023-05-31 ENCOUNTER — Encounter: Payer: Self-pay | Admitting: Family Medicine

## 2023-05-31 ENCOUNTER — Ambulatory Visit (INDEPENDENT_AMBULATORY_CARE_PROVIDER_SITE_OTHER): Payer: PPO | Admitting: Family Medicine

## 2023-05-31 VITALS — BP 128/62 | HR 61 | Temp 97.9°F | Ht 72.0 in | Wt 166.0 lb

## 2023-05-31 DIAGNOSIS — K529 Noninfective gastroenteritis and colitis, unspecified: Secondary | ICD-10-CM | POA: Diagnosis not present

## 2023-05-31 DIAGNOSIS — I10 Essential (primary) hypertension: Secondary | ICD-10-CM | POA: Diagnosis not present

## 2023-05-31 DIAGNOSIS — E1142 Type 2 diabetes mellitus with diabetic polyneuropathy: Secondary | ICD-10-CM

## 2023-05-31 DIAGNOSIS — M5416 Radiculopathy, lumbar region: Secondary | ICD-10-CM | POA: Diagnosis not present

## 2023-05-31 LAB — POCT GLYCOSYLATED HEMOGLOBIN (HGB A1C): Hemoglobin A1C: 6.2 % — AB (ref 4.0–5.6)

## 2023-05-31 MED ORDER — METFORMIN HCL ER 500 MG PO TB24: 1500.0000 mg | ORAL_TABLET | Freq: Every day | ORAL | Status: DC

## 2023-05-31 MED ORDER — MESALAMINE 1.2 G PO TBEC: DELAYED_RELEASE_TABLET | ORAL | Status: DC

## 2023-05-31 NOTE — Progress Notes (Signed)
Subjective  CC:  Chief Complaint  Patient presents with   Diabetes    HPI: Shannon Hodge is a 77 y.o. male who presents to the office today for follow up of diabetes and problems listed above in the chief complaint.  Diabetes follow up: His diabetic control is reported as  good . Fastings run < 120. Feels well. On met xr 1500 daily. No concerns. Tolerates.  He denies exertional CP or SOB or symptomatic hypoglycemia. He denies foot sores. HTN on lisinopril and doing well. No cp or sob Back pain: completed nerve stimulation therapy: about 20% better. Continues to exercise.  IBD is improving; has weaned down to 1 or two tabs of mesalamine daily. Needs to establish with new GI dob (dr. Orvan Falconer left practice).   Wt Readings from Last 3 Encounters:  05/31/23 166 lb (75.3 kg)  01/10/23 164 lb (74.4 kg)  12/06/22 164 lb 3.2 oz (74.5 kg)    BP Readings from Last 3 Encounters:  05/31/23 128/62  12/06/22 118/80  11/03/22 114/60    Assessment  1. Controlled type 2 diabetes mellitus with diabetic polyneuropathy, without long-term current use of insulin (HCC)   2. Diabetic polyneuropathy associated with type 2 diabetes mellitus (HCC)   3. Lumbar radiculopathy   4. Essential hypertension   5. Inflammatory bowel disease      Plan  Diabetes is currently very well controlled. Continue diet, exercise and met xr 1500 daily.  Neuropathy: on gabapentin w/ fair control HTN is controlled on ace IBD is improved. Will get f/u with GI  Follow up: 6 mo for cpe Orders Placed This Encounter  Procedures   POCT HgB A1C   Meds ordered this encounter  Medications   metFORMIN (GLUCOPHAGE-XR) 500 MG 24 hr tablet    Sig: Take 3 tablets (1,500 mg total) by mouth daily with breakfast.   mesalamine (LIALDA) 1.2 g EC tablet    Sig: TAKE 2 TABLETS BY MOUTH DAILY WITH BREAKFAST.      Immunization History  Administered Date(s) Administered   Fluad Quad(high Dose 65+) 03/30/2020, 04/29/2022   Influenza,  High Dose Seasonal PF 05/09/2016, 05/04/2017, 04/12/2018, 03/30/2020, 04/09/2021, 04/29/2022, 04/14/2023   Influenza,inj,Quad PF,6+ Mos 04/05/2019   Influenza-Unspecified 05/01/2015   PFIZER Comirnaty(Gray Top)Covid-19 Tri-Sucrose Vaccine 10/28/2020, 04/29/2022   PFIZER(Purple Top)SARS-COV-2 Vaccination 08/22/2019, 09/12/2019, 03/30/2020   Pfizer Covid-19 Vaccine Bivalent Booster 67yrs & up 04/09/2021, 11/23/2021, 10/20/2022   Pfizer(Comirnaty)Fall Seasonal Vaccine 12 years and older 04/14/2023   Pneumococcal Conjugate-13 10/03/2016   Pneumococcal Polysaccharide-23 01/27/2011, 09/21/2017   Respiratory Syncytial Virus Vaccine,Recomb Aduvanted(Arexvy) 08/28/2022   Tdap 01/27/2011   Zoster Recombinant(Shingrix) 06/25/2018, 09/06/2018   Zoster, Live 01/19/2010    Diabetes Related Lab Review: Lab Results  Component Value Date   HGBA1C 6.2 (A) 05/31/2023   HGBA1C 6.3 11/03/2022   HGBA1C 6.2 (A) 08/03/2022    Lab Results  Component Value Date   MICROALBUR <0.7 11/03/2022   Lab Results  Component Value Date   CREATININE 1.15 11/03/2022   BUN 30 (H) 11/03/2022   NA 142 11/03/2022   K 4.2 11/03/2022   CL 103 11/03/2022   CO2 32 11/03/2022   Lab Results  Component Value Date   CHOL 134 11/03/2022   CHOL 110 10/29/2021   CHOL 152 10/26/2020   Lab Results  Component Value Date   HDL 61.40 11/03/2022   HDL 43.00 10/29/2021   HDL 51.30 10/26/2020   Lab Results  Component Value Date   LDLCALC 59 11/03/2022  LDLCALC 50 10/29/2021   LDLCALC 76 10/26/2020   Lab Results  Component Value Date   TRIG 67.0 11/03/2022   TRIG 84.0 10/29/2021   TRIG 122.0 10/26/2020   Lab Results  Component Value Date   CHOLHDL 2 11/03/2022   CHOLHDL 3 10/29/2021   CHOLHDL 3 10/26/2020   No results found for: "LDLDIRECT" The 10-year ASCVD risk score (Arnett DK, et al., 2019) is: 37.9%   Values used to calculate the score:     Age: 63 years     Sex: Male     Is Non-Hispanic African  American: No     Diabetic: Yes     Tobacco smoker: No     Systolic Blood Pressure: 128 mmHg     Is BP treated: Yes     HDL Cholesterol: 61.4 mg/dL     Total Cholesterol: 134 mg/dL I have reviewed the PMH, Fam and Soc history. Patient Active Problem List   Diagnosis Date Noted Date Diagnosed   Inflammatory bowel disease 10/26/2020     Priority: High    Unspecified. Seeing [facility]    Essential hypertension 04/01/2020     Priority: High   Spinal stenosis of lumbar region 04/01/2020     Priority: High    Dr. Dion Saucier    Diabetic polyneuropathy associated with type 2 diabetes mellitus (HCC) 01/01/2019     Priority: High   Restless leg syndrome 09/21/2017     Priority: High   Mixed hyperlipidemia 09/21/2017     Priority: High   Controlled type 2 diabetes mellitus with diabetic polyneuropathy, without long-term current use of insulin (HCC) 09/01/2016     Priority: High   Insomnia 02/22/2016     Priority: High   Lumbar radiculopathy 11/04/2014     Priority: High   Hearing loss 09/21/2017     Priority: Medium     Wears hearing aides    Muscle cramp, nocturnal 09/21/2017     Priority: Medium    Foot drop, 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 He has never used smokeless tobacco. He reports that he does not drink alcohol and does not use drugs.  Review of Systems: Ophthalmic: negative for eye pain, loss of vision or double vision Cardiovascular: negative for chest pain Respiratory: negative for SOB or persistent cough Gastrointestinal: negative for abdominal pain Genitourinary: negative for dysuria or gross hematuria MSK: negative for  foot lesions Neurologic: negative for weakness or gait disturbance  Objective  Vitals: BP 128/62   Pulse 61   Temp 97.9 F (36.6 C)   Ht 6' (1.829 m)   Wt 166 lb (75.3 kg)   SpO2 97%   BMI 22.51 kg/m  General: well appearing, no acute distress  Psych:  Alert and oriented, normal mood and affect HEENT:  Normocephalic, atraumatic, moist mucous membranes, supple neck  Cardiovascular:  Nl S1 and S2, RRR without murmur, gallop or rub. no edema Respiratory:  Good breath sounds bilaterally, CTAB with normal effort, no rales Neurologic:   Mental status is normal.  Foot  exam: no erythema, pallor, or cyanosis visible nl proprioception and sensation to monofilament testing bilaterally, +2 distal pulses bilaterally    Diabetic education: ongoing education regarding chronic disease management for diabetes was given today. We continue to reinforce the ABC's of diabetic management: A1c (<7 or 8 dependent upon patient), tight blood pressure control, and cholesterol management with goal LDL < 100 minimally. We discuss diet strategies, exercise recommendations, medication options and possible side effects. At each visit, we review recommended immunizations and preventive care recommendations for diabetics and stress that good diabetic control can prevent other problems. See below for this patient's data.   Commons side effects, risks, benefits, and alternatives for medications and treatment plan prescribed today were discussed, and the patient expressed understanding of the given instructions. Patient is instructed to call or message via MyChart if he/she has any questions or concerns regarding our treatment plan. No barriers to understanding were identified. We discussed Red Flag symptoms and signs in detail. Patient expressed understanding regarding what to do in case of urgent or emergency type symptoms.  Medication list was reconciled, printed and provided to the patient in AVS. Patient instructions and  summary information was reviewed with the patient as documented in the AVS. This note was prepared with assistance of Dragon voice recognition software. Occasional wrong-word or sound-a-like substitutions may have occurred due to the inherent limitations of voice recognition software

## 2023-07-05 DIAGNOSIS — H40023 Open angle with borderline findings, high risk, bilateral: Secondary | ICD-10-CM | POA: Diagnosis not present

## 2023-07-05 DIAGNOSIS — E119 Type 2 diabetes mellitus without complications: Secondary | ICD-10-CM | POA: Diagnosis not present

## 2023-07-05 LAB — HM DIABETES EYE EXAM

## 2023-07-13 ENCOUNTER — Encounter: Payer: Self-pay | Admitting: Diagnostic Neuroimaging

## 2023-07-15 ENCOUNTER — Encounter: Payer: Self-pay | Admitting: Family Medicine

## 2023-07-19 ENCOUNTER — Encounter: Payer: Self-pay | Admitting: Family Medicine

## 2023-07-19 ENCOUNTER — Ambulatory Visit (INDEPENDENT_AMBULATORY_CARE_PROVIDER_SITE_OTHER): Payer: PPO | Admitting: Family Medicine

## 2023-07-19 VITALS — BP 138/62 | HR 53 | Temp 98.1°F | Ht 72.0 in | Wt 166.8 lb

## 2023-07-19 DIAGNOSIS — M5416 Radiculopathy, lumbar region: Secondary | ICD-10-CM | POA: Diagnosis not present

## 2023-07-19 DIAGNOSIS — M48062 Spinal stenosis, lumbar region with neurogenic claudication: Secondary | ICD-10-CM | POA: Diagnosis not present

## 2023-07-19 DIAGNOSIS — G4701 Insomnia due to medical condition: Secondary | ICD-10-CM

## 2023-07-19 DIAGNOSIS — E1142 Type 2 diabetes mellitus with diabetic polyneuropathy: Secondary | ICD-10-CM | POA: Diagnosis not present

## 2023-07-19 MED ORDER — GABAPENTIN 600 MG PO TABS: ORAL_TABLET | ORAL | 5 refills | Status: DC

## 2023-07-19 MED ORDER — GABAPENTIN 600 MG PO TABS: ORAL_TABLET | ORAL | Status: DC

## 2023-07-19 NOTE — Patient Instructions (Addendum)
Please follow up as scheduled for your next visit with me: 11/29/2023   If you have any questions or concerns, please don't hesitate to send me a message via MyChart or call the office at [phone]. Thank you for visiting with [place] today! It's our pleasure caring for you.   VISIT SUMMARY:  During today's visit, we discussed your worsening neuropathic pain and chronic back pain. We also addressed a skin concern on your back. We reviewed your current medications and made adjustments to better manage your symptoms.  YOUR PLAN:  -NEUROPATHIC PAIN: Neuropathic pain is a type of chronic pain caused by nerve damage, often described as a burning or sandpaper-like sensation. Your current medication, gabapentin, is not providing sufficient relief, so we have decided to increase the dosage to 600 mg in the morning, 600 mg in the early evening, and 1200 mg at bedtime. Please monitor for side effects such as morning grogginess and nighttime confusion, and we will reassess in two weeks.  -CHRONIC BACK PAIN: Chronic back pain is long-lasting pain in the back that can be exacerbated by physical activity. Your pain is currently manageable with activity modification and avoiding overexertion. Continue with these modifications, and we will consider further interventions if the pain becomes unmanageable.  -GENERAL HEALTH MAINTENANCE: Your weight and appetite are well-managed, and you are maintaining regular annual check-ups. Continue with these annual check-ups to monitor your overall health.  INSTRUCTIONS:  Please follow the new gabapentin dosage as prescribed and monitor for any side effects. We will reassess your condition in two weeks. Additionally, ensure you attend your follow-up appointment with gastroenterology.

## 2023-07-19 NOTE — Progress Notes (Signed)
Subjective  CC:  Chief Complaint  Patient presents with   Insomnia    Pt stated that he has not being able to get adequate sleep in the night which has been going on for the past 1 1/2 and think that some of it is from neuropathy. He is able to get about 3/5 hours of sleep and maybe 6 on a good day     HPI: Shannon Hodge is a 77 y.o. male who presents to the office today to address the problems listed above in the chief complaint. Discussed the use of AI scribe software for clinical note transcription with the patient, who gave verbal consent to proceed.  History of Present Illness   The patient, with a history of neuropathy and back pain, reports worsening sleep over the past six months. He describes the neuropathic pain as an intense pressure and a sandpaper-like feeling on the top of his feet, extending up around the ankles. The pain is gradually intensifying and is particularly bothersome at night, often waking the patient. He also reports back pain, which is manageable unless he overexerts himself during workouts. The patient is currently on gabapentin, taken three times a day, for neuropathy. Worsening sleep due to this pain. Hasn't tried sleep meds in the past.       Assessment  1. Diabetic polyneuropathy associated with type 2 diabetes mellitus (HCC)   2. Lumbar radiculopathy   3. Spinal stenosis of lumbar region with neurogenic claudication   4. Insomnia due to medical condition      Plan  Assessment and Plan    Neuropathic Pain   Chronic neuropathic pain in the feet and ankles, described as a sandpaper-like feeling, worsening over the past six months. Current management with gabapentin 600 mg TID provides insufficient relief. He is hesitant to increase the dose due to potential side effects but is willing to try higher doses for better pain control. Discussed potential benefits of increasing gabapentin dosage, including improved pain management, and risks such as morning  grogginess and nighttime confusion.   - Increase gabapentin to 600 mg in the morning, 600 mg in the early evening, and 1200 mg at bedtime.   - Send a new prescription for gabapentin.   - Monitor for side effects such as morning grogginess and nighttime confusion.   - Reassess in two weeks to evaluate effectiveness and side effects.    Chronic Back Pain   Chronic back pain exacerbated by physical activity. Previous procedure provided approximately 20% improvement. Pain is manageable with activity modification and avoiding overexertion.   - Continue current activity modification.   - Consider further interventions if pain becomes unmanageable.    General Health Maintenance   Reports well-managed weight and appetite. Regular annual check-ups are maintained.   - Continue annual check-ups.    F/u April as scheduled     Meds ordered this encounter  Medications   DISCONTD: gabapentin (NEURONTIN) 600 MG tablet    Sig: Take 1 tablet (600 mg total) by mouth 2 (two) times daily AND 2 tablets (1,200 mg total) at bedtime.   gabapentin (NEURONTIN) 600 MG tablet    Sig: Take 1 tablet (600 mg total) by mouth 2 (two) times daily AND 2 tablets (1,200 mg total) at bedtime.    Dispense:  120 tablet    Refill:  5     I reviewed the patients updated PMH, FH, and SocHx.    Patient Active Problem List   Diagnosis Date Noted  Inflammatory bowel disease 10/26/2020    Priority: High   Essential hypertension 04/01/2020    Priority: High   Spinal stenosis of lumbar region 04/01/2020    Priority: High   Diabetic polyneuropathy associated with type 2 diabetes mellitus (HCC) 01/01/2019    Priority: High   Restless leg syndrome 09/21/2017    Priority: High   Mixed hyperlipidemia 09/21/2017    Priority: High   Controlled type 2 diabetes mellitus with diabetic polyneuropathy, without long-term current use of insulin (HCC) 09/01/2016    Priority: High   Insomnia 02/22/2016    Priority: High   Lumbar  radiculopathy 11/04/2014    Priority: High   Hearing loss 09/21/2017    Priority: Medium    Muscle cramp, nocturnal 09/21/2017    Priority: Medium    Foot drop, left 09/21/2017    Priority: Medium    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   Blood Glucose Monitoring Suppl (ONE TOUCH ULTRA 2) w/Device KIT USE AS DIRECTED   celecoxib (CELEBREX) 100 MG capsule Take 100 mg by mouth 2 (two) times daily.   cholecalciferol (VITAMIN D) 1000 units tablet Take 1,000 Units by mouth daily.   Coenzyme Q10 (COQ10) 100 MG CAPS Take 1 capsule by mouth daily.   Cyanocobalamin (VITAMIN B-12 PO) Take by mouth daily.   fenofibrate micronized (LOFIBRA) 134 MG capsule TAKE 1 CAPSULE BY MOUTH EVERY DAY BEFORE BREAKFAST   Ferrous Sulfate (IRON PO) Take by mouth.   glucose blood (FREESTYLE LITE) test strip Use as instructed   Lancets (ONETOUCH ULTRASOFT) lancets Use as instructed   lisinopril (ZESTRIL) 20 MG tablet TAKE 1 TABLET BY MOUTH EVERY DAY   Magnesium Oxide 500 MG TABS Take by mouth.   mesalamine (LIALDA) 1.2 g EC tablet TAKE 2 TABLETS BY MOUTH DAILY WITH BREAKFAST.   metFORMIN (GLUCOPHAGE-XR) 500 MG 24 hr tablet Take 3 tablets (1,500 mg total) by mouth daily with breakfast.   simvastatin (ZOCOR) 40 MG tablet TAKE 1 TABLET BY MOUTH EVERYDAY AT BEDTIME   valACYclovir (VALTREX) 500 MG tablet Take 1 tablet (500 mg total) by mouth daily as needed (outbreak).   [DISCONTINUED] gabapentin (NEURONTIN) 600 MG tablet Take 1 tablet (600 mg total) by mouth 4 (four) times daily as needed.    Allergies: Patient has no known allergies. Family History: Patient family history includes Brain cancer in his father; Diabetes in his father, maternal grandfather,  maternal grandmother, paternal grandfather, and paternal grandmother; Healthy in his daughter and son; Pancreatic cancer in his mother; Stroke in his father. Social History:  Patient  reports that he has never smoked. He has never used smokeless tobacco. He reports that he does not drink alcohol and does not use drugs.  Review of Systems: Constitutional: Negative for fever malaise or anorexia Cardiovascular: negative for chest pain Respiratory: negative for SOB or persistent cough Gastrointestinal: negative for abdominal pain  Objective  Vitals: BP 138/62   Pulse (!) 53   Temp 98.1 F (36.7 C)   Ht 6' (1.829 m)   Wt 166 lb 12.8 oz (75.7 kg)   SpO2 91%   BMI 22.62 kg/m  General: no acute distress , A&Ox3  Commons side effects, risks, benefits, and alternatives for medications and treatment plan prescribed today were discussed, and the patient expressed understanding of the given instructions. Patient is instructed to call or message via MyChart if he/she has any questions or concerns regarding our treatment plan. No barriers to understanding were identified. We discussed Red Flag symptoms and signs in detail. Patient expressed understanding regarding what to do in case of urgent or emergency type symptoms.  Medication list was reconciled, printed and provided to the patient in AVS. Patient instructions and summary information was reviewed with the patient as documented in the AVS. This note was prepared with assistance of Dragon voice recognition software. Occasional wrong-word or sound-a-like substitutions may have occurred due to the inherent limitations of voice recognition software

## 2023-08-14 ENCOUNTER — Ambulatory Visit: Payer: PPO | Admitting: Internal Medicine

## 2023-08-14 ENCOUNTER — Encounter: Payer: Self-pay | Admitting: Family Medicine

## 2023-08-14 ENCOUNTER — Encounter: Payer: Self-pay | Admitting: Internal Medicine

## 2023-08-14 VITALS — BP 102/58 | HR 68 | Ht 72.0 in | Wt 167.0 lb

## 2023-08-14 DIAGNOSIS — K5190 Ulcerative colitis, unspecified, without complications: Secondary | ICD-10-CM | POA: Diagnosis not present

## 2023-08-14 DIAGNOSIS — K51919 Ulcerative colitis, unspecified with unspecified complications: Secondary | ICD-10-CM

## 2023-08-14 DIAGNOSIS — K529 Noninfective gastroenteritis and colitis, unspecified: Secondary | ICD-10-CM | POA: Diagnosis not present

## 2023-08-14 MED ORDER — MESALAMINE 1.2 G PO TBEC: DELAYED_RELEASE_TABLET | ORAL | 3 refills | Status: DC

## 2023-08-14 NOTE — Patient Instructions (Signed)
 We have sent the following medications to your pharmacy for you to pick up at your convenience:  Lialda   Please follow up in one year  _______________________________________________________  If your blood pressure at your visit was 140/90 or greater, please contact your primary care physician to follow up on this.  _______________________________________________________  If you are age 75 or older, your body mass index should be between 23-30. Your Body mass index is 22.65 kg/m. If this is out of the aforementioned range listed, please consider follow up with your Primary Care Provider.  If you are age 37 or younger, your body mass index should be between 19-25. Your Body mass index is 22.65 kg/m. If this is out of the aformentioned range listed, please consider follow up with your Primary Care Provider.   ________________________________________________________  The [facility] providers would like to encourage you to use MYCHART to communicate with providers for non-urgent requests or questions.  Due to long hold times on the telephone, sending your provider a message by [facility] may be a faster and more efficient way to get a response.  Please allow 48 business hours for a response.  Please remember that this is for non-urgent requests.  _______________________________________________________

## 2023-08-14 NOTE — Progress Notes (Signed)
 HISTORY OF PRESENT ILLNESS:  Shannon Hodge is a 78 y.o. male with chronic idiopathic IBD, diabetes mellitus, and chronic back pain.  He presents today to establish GI care with myself.  He was initially diagnosed with chronic colitis in 2013 by Dr. Gladis Louder.  He established care with Dr. Suzen Brass in 2021.  He was last seen in this office August 24, 2022.  See that dictation.  Blood work at that time revealed normal CRP and normal sedimentation rate.  His last comprehensive metabolic panel and CBC in April 2024 were unremarkable.  Hemoglobin 13.0.  Endoscopic history:  - Colonoscopy with Dr. Louder in 2013 showed mildly active chronic colitis in cecal biopsies and rectal biopsies.  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 Treated with mesalamine . Told to stop using ibuprofen.  - EGD and colonoscopy with Dr. Gladis Louder 11/01/16 for heme positive stools and a hemoglobin of 12.8. . EGD showed distal duodenal bulb benign stricture preventing intubation of the second portion duodenum. Colonoscopy was normal. Surveillance colonoscopy recommended in 10 years.  - Colonoscopy 06/19/20 with Dr. Brass showed  congested, erythematous and ulcerated mucosa in the rectum and in the recto-sigmoid colon.   Patient tells me that he has been doing well.  He feels manage on his history and has various questions.  Time of his last visit he had decrease his mesalamine  to 2.4 g daily.  Currently on 1.2 g daily.  No change in his clinical status.  He reports 1 bowel movement daily which tends to be on the loose side.  He is on metformin .  No bleeding.  No abdominal pain.  No other complaints.  REVIEW OF SYSTEMS:  All non-GI ROS negative unless otherwise stated in the HPI except for back pain and neuropathy  Past Medical History:  Diagnosis Date   Basal cell carcinoma    upper back   Cataract    right eye   Diabetes (HCC)    type 2   Diabetic neuropathy (HCC)     both legs   DJD (degenerative joint disease)    L 5   Hypercholesteremia    Hyperlipidemia    Hypertension    Inflammatory bowel disease 10/26/2020   Unspecified. Seeing [facility]   Neuropathy    Ruptured Achilles tendon due to trauma 10/2016   Ulcerative proctitis [facility])     Past Surgical History:  Procedure Laterality Date   basil cell     basil cell/mole removed from back   COLONOSCOPY WITH PROPOFOL  N/A 11/01/2016   Procedure: COLONOSCOPY WITH PROPOFOL ;  Surgeon: Gladis MARLA Louder, MD;  Location: [facility];  Service: Endoscopy;  Laterality: N/A;   colonscopy     x 2   cortisone injection  04/2021   lower back   ESOPHAGOGASTRODUODENOSCOPY (EGD) WITH PROPOFOL  N/A 11/01/2016   Procedure: ESOPHAGOGASTRODUODENOSCOPY (EGD) WITH PROPOFOL ;  Surgeon: Gladis MARLA Louder, MD;  Location: [facility];  Service: Endoscopy;  Laterality: N/A;   Peripheral Nerve Stimulation N/A 03/2023   TONSILLECTOMY      Social History Demba Nigh  reports that he has never smoked. He has never used smokeless tobacco. He reports that he does not drink alcohol and does not use drugs.  family history includes Brain cancer in his father; Diabetes in his father, maternal grandfather, maternal grandmother, paternal grandfather, and paternal grandmother; Healthy in his daughter and son; Pancreatic cancer in his mother; Stroke in his father.  No Known Allergies     PHYSICAL EXAMINATION:  Vital signs: BP (!) 102/58   Pulse 68   Ht 6' (1.829 m)   Wt 167 lb (75.8 kg)   BMI 22.65 kg/m   Constitutional: generally well-appearing, no acute distress Psychiatric: alert and oriented x3, cooperative Eyes: extraocular movements intact, anicteric, conjunctiva pink Mouth: oral pharynx moist, no lesions Neck: supple no lymphadenopathy Cardiovascular: heart regular rate and rhythm, no murmur Lungs: clear to auscultation bilaterally Abdomen: soft, nontender, nondistended, no obvious ascites, no peritoneal signs,  normal bowel sounds, no organomegaly Rectal: Omitted Extremities: no clubbing, cyanosis, or lower extremity edema bilaterally Skin: no lesions on visible extremities Neuro: No focal deficits.  Cranial nerves intact  ASSESSMENT:  1.  Chronic colitis.  Most consistent with ulcerative colitis.  Mild patchy disease on his last exam involving the rectum and cecum.  Normal ileum.  Clinically quiescent on low-dose mesalamine .  Inflammatory markers last year normal.  Unremarkable lab work.  No change in clinical status   PLAN:  1.  Continue mesalamine  daily.  He is currently on 1.2 g daily.  Prescription refilled.  Medication effects/side effects reviewed.  Discussed maintenance therapy. 2.  Routine GI office follow-up 1 year 3.  Will plan on follow-up surveillance colonoscopy next year (5 years from his previous exam). 4.  Interval follow-up as needed A total time of 30 minutes was spent preparing to see the patient (majority of the time), obtaining interval history, performing medically appropriate physical exam, counseling and educating the patient regarding the above listed issues, and documenting clinical information in the health record

## 2023-08-15 ENCOUNTER — Telehealth: Payer: PPO | Admitting: Physician Assistant

## 2023-08-15 ENCOUNTER — Encounter: Payer: Self-pay | Admitting: Family Medicine

## 2023-08-15 DIAGNOSIS — R300 Dysuria: Secondary | ICD-10-CM

## 2023-08-15 NOTE — Progress Notes (Signed)
 E-Visit for Urinary Problems ? ?Based on what you shared with me, I feel your condition warrants further evaluation and I recommend that you be seen for a face to face office visit.  Male bladder infections are not very common.  We worry about prostate or kidney conditions.  The standard of care is to examine the abdomen and kidneys, and to do a urine and blood test to make sure that something more serious is not going on.  We recommend that you see a provider today.  If your doctor's office is closed [facility] has the following Urgent Cares: ? ?  ?NOTE: You will not be charged for this e-visit. ? ?If you are having a true medical emergency please call 911.   ? ?  ? For an urgent face to face visit, [place] has six urgent care centers for your convenience:  ?  ? [facility] ?Get Driving Directions ?[phone] ?[phone] ?[place], [place] [address] ?  ? [facility]) ?Get Driving Directions ?[phone] ?[address] ?[place], [place] [address] ? ?[facility]) ?Get Driving Directions ?[phone] ?[address] ?[place],  [place]  [address] ? ?[facility] ?Get Driving Directions ?[phone] ?[address], [address] ?[place], [place] [address] ?  ?[facility] [place] ?Get Driving Directions  ?[phone] ?[address] ?[address] ?[place], [place] [address] ?  ? Urgent Care at [facility] ?Get Driving Directions ?[phone] ?[address] ?[place], [place] [address] ? ?Your MyChart E-visit questionnaire answers were reviewed by a board certified advanced clinical practitioner to complete your personal care plan based on your specific symptoms.  Thank you for using e-Visits. ?

## 2023-08-16 ENCOUNTER — Ambulatory Visit: Payer: Self-pay | Admitting: Family Medicine

## 2023-08-16 NOTE — Telephone Encounter (Signed)
 FYI, pt scheduled tomorrow for an appt 1/16 with Dr. Arlene Ben.

## 2023-08-16 NOTE — Telephone Encounter (Signed)
 Copied from CRM [id number]. Topic: Clinical - Red Word Triage >> Aug 16, 2023 11:25 AM Aline Ireland wrote: Red Word that prompted transfer to Nurse Triage: Pt stated that 3 days ago he started having slight/moderate pain, cloudy urine, increased urgency  Chief Complaint:  urine pain/ burning Symptoms:moderate pain/burning sensation with urination, cloudy urine, increased urgency and frequency Frequency: x 3 days Pertinent Negatives: Patient denies blood in urine Disposition: [] ED /[] Urgent Care (no appt availability in office) / [x] Appointment(In office/virtual)/ []  [place] [facility]/ [] Home Care/ [] Refused Recommended Disposition /[] [facility]/ []  Follow-up with PCP Additional Notes: no fever  Reason for Disposition  All other males with painful urination  Answer Assessment - Initial Assessment Questions 1. SEVERITY: "How bad is the pain?"  (e.g., Scale 1-10; mild, moderate, or severe)   - MILD (1-3): Complains slightly about urination hurting.   - MODERATE (4-7): Interferes with normal activities.     - SEVERE (8-10): Excruciating, unwilling or unable to urinate because of the pain.      More burning sensation than pain 2. FREQUENCY: "How many times have you had painful urination today?"      increased 3. PATTERN: "Is pain present every time you urinate or just sometimes?"      constant 4. ONSET: "When did the painful urination start?"      3 days ago 5. FEVER: "Do you have a fever?" If Yes, ask: "What is your temperature, how was it measured, and when did it start?"     no 6. PAST UTI: "Have you had a urine infection before?" If Yes, ask: "When was the last time?" and "What happened that time?"      E about 6 years ago 7. CAUSE: "What do you think is causing the painful urination?"      unknown 8. OTHER SYMPTOMS: "Do you have any other symptoms?" (e.g., flank pain, penis discharge, scrotal pain, blood in urine)      slight/moderate pain, cloudy urine, increased  urgency  Protocols used: Urination Pain - Male-A-AH

## 2023-08-17 ENCOUNTER — Encounter: Payer: Self-pay | Admitting: Family Medicine

## 2023-08-17 ENCOUNTER — Ambulatory Visit (INDEPENDENT_AMBULATORY_CARE_PROVIDER_SITE_OTHER): Payer: PPO | Admitting: Family Medicine

## 2023-08-17 VITALS — BP 110/70 | HR 70 | Temp 97.2°F | Ht 72.0 in | Wt 167.4 lb

## 2023-08-17 DIAGNOSIS — N3001 Acute cystitis with hematuria: Secondary | ICD-10-CM | POA: Diagnosis not present

## 2023-08-17 DIAGNOSIS — R8290 Unspecified abnormal findings in urine: Secondary | ICD-10-CM | POA: Diagnosis not present

## 2023-08-17 LAB — POC URINALSYSI DIPSTICK (AUTOMATED)
Bilirubin, UA: NEGATIVE
Blood, UA: POSITIVE
Glucose, UA: NEGATIVE
Ketones, UA: NEGATIVE
Nitrite, UA: NEGATIVE
Protein, UA: POSITIVE — AB
Spec Grav, UA: 1.015 (ref 1.010–1.025)
Urobilinogen, UA: 0.2 U/dL
pH, UA: 6 (ref 5.0–8.0)

## 2023-08-17 MED ORDER — CEPHALEXIN 500 MG PO CAPS: 500.0000 mg | ORAL_CAPSULE | Freq: Three times a day (TID) | ORAL | 0 refills | Status: AC

## 2023-08-17 NOTE — Patient Instructions (Signed)
UA with possible UTI. Will get culture. Empiric treatment with: cephalexin/keflex three times a day for 7 days Patient to follow up if new  (particularly fever) or worsening symptoms or failure to improve.

## 2023-08-17 NOTE — Progress Notes (Signed)
Phone [phone] In person visit   Subjective:   Shannon Hodge is a 78 y.o. year old very pleasant male patient who presents for/with See problem oriented charting Chief Complaint  Patient presents with   Urinary Urgency    Pt c/o urinary urgency and cloudiness that started a few days ago.   Past Medical History-  Patient Active Problem List   Diagnosis Date Noted   Ulcerative colitis (HCC) 10/26/2020   Essential hypertension 04/01/2020   Spinal stenosis of lumbar region 04/01/2020   Diabetic polyneuropathy associated with type 2 diabetes mellitus (HCC) 01/01/2019   Restless leg syndrome 09/21/2017   Mixed hyperlipidemia 09/21/2017   Hearing loss 09/21/2017   Muscle cramp, nocturnal 09/21/2017   Vitamin B12 deficiency 09/21/2017   Foot drop, left 09/21/2017   Controlled type 2 diabetes mellitus with diabetic polyneuropathy, without long-term current use of insulin (HCC) 09/01/2016   Allergic rhinitis 02/22/2016   Genital herpes 02/22/2016   Insomnia 02/22/2016   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     Ascorbic Acid (VITAMIN C PO) Take by mouth.     Blood Glucose Monitoring Suppl (ONE TOUCH ULTRA 2) w/Device KIT USE AS DIRECTED 1 kit 0   celecoxib (CELEBREX) 100 MG capsule Take 100 mg by mouth 2 (two) times daily.     cephALEXin (KEFLEX) 500 MG capsule Take 1 capsule (500 mg total) by mouth 3 (three) times daily for 7 days. 21 capsule 0   cholecalciferol (VITAMIN D) 1000 units tablet Take 1,000 Units by mouth daily.     Coenzyme Q10 (COQ10) 100 MG CAPS Take 1 capsule by mouth daily.     Cyanocobalamin (VITAMIN B-12 PO) Take by mouth daily.     fenofibrate micronized (LOFIBRA) 134 MG capsule TAKE 1 CAPSULE BY MOUTH EVERY DAY BEFORE BREAKFAST 90 capsule 3   Ferrous Sulfate (IRON  PO) Take by mouth.     gabapentin (NEURONTIN) 600 MG tablet Take 1 tablet (600 mg total) by mouth 2 (two) times daily AND 2 tablets (1,200 mg total) at bedtime. 120 tablet 5   glucose blood (FREESTYLE LITE) test strip Use as instructed 100 each 12   Lancets (ONETOUCH ULTRASOFT) lancets Use as instructed 100 each 12   lisinopril (ZESTRIL) 20 MG tablet TAKE 1 TABLET BY MOUTH EVERY DAY 90 tablet 3   Magnesium Oxide 500 MG TABS Take by mouth.     mesalamine (LIALDA) 1.2 g EC tablet TAKE 2 TABLETS BY MOUTH DAILY WITH BREAKFAST. 180 tablet 3   metFORMIN (GLUCOPHAGE-XR) 500 MG 24 hr tablet Take 3 tablets (1,500 mg total) by mouth daily with breakfast.     simvastatin (ZOCOR) 40 MG tablet TAKE 1 TABLET BY MOUTH EVERYDAY AT BEDTIME 90 tablet 3   tiZANidine (ZANAFLEX) 2 MG tablet      valACYclovir (VALTREX) 500 MG tablet Take 1 tablet (500 mg total) by mouth daily as needed (outbreak). 30 tablet 2   cyclobenzaprine (FLEXERIL) 5 MG tablet Take 5 mg by mouth at bedtime as needed. (Patient not taking: Reported on 08/14/2023)     No current facility-administered medications for this visit.     Objective:  BP 110/70   Pulse 70   Temp (!) 97.2 F (36.2 C)   Ht 6' (1.829 m)   Wt 167 lb 6.4 oz (75.9 kg)  SpO2 98%   BMI 22.70 kg/m  Gen: NAD, resting comfortably CV: RRR no murmurs rubs or gallops Lungs: CTAB no crackles, wheeze, rhonchi Abdomen: soft/nontender/nondistended/normal bowel sounds. No rebound or guarding.  No CVA tenderness Ext: no edema Skin: warm, dry Rectal: normal tone, smooth prostate, no masses or tenderness  Results for orders placed or performed in visit on 08/17/23 (from the past 24 hours)  POCT Urinalysis Dipstick (Automated)     Status: Abnormal   Collection Time: 08/17/23  1:16 PM  Result Value Ref Range   Color, UA yellow    Clarity, UA clear    Glucose, UA Negative Negative   Bilirubin, UA neg    Ketones, UA neg    Spec Grav, UA 1.015 1.010 - 1.025   Blood, UA  positive    pH, UA 6.0 5.0 - 8.0   Protein, UA Positive (A) Negative   Urobilinogen, UA 0.2 0.2 or 1.0 E.U./dL   Nitrite, UA neg    Leukocytes, UA Large (3+) (A) Negative       Assessment and Plan   #Concern for UTI S: Patients symptoms started  4-5 days ago.   From e visit 08/15/23 " This episode started a few days ago. I passed coagulated blood a few months ago, visited Dr. Annabell Howells, with [facility]. Blood test ok, CT scan ok, waiting for cytoscopy in end of March.    Blood in urine about six years ago after dive playing pickleball.Blood test, Ct scan, cytoscopy all ok. Dr. Annabell Howells was attending physician.  " Complains of dysuria: slight/moderate pain ; polyuria: YES; nocturia: no; urgency: YES.  Has also noted cloudy discoloration. Reports last urinary tract infection 7-8 years ago Symptoms are slightly improving. Cranberry juice seems to help.   ROS- no fever, chills, nausea, vomiting, flank pain- other than chronic back pain. No visible blood in urine or spotting. No penile discharge. Not sexually active with wife due to age.   A/P: UA with possible UTI. Will get culture. Empiric treatment with: cephalexin/keflex three times a day for 7 days Patient to follow up if new  (particularly fever) or worsening symptoms or failure to improve.    Recommended follow up: No follow-ups on file. Future Appointments  Date Time Provider Department Center  11/29/2023  8:30 AM Willow Ora, MD [facility]  01/16/2024  2:00 PM [facility] ANNUAL WELLNESS VISIT 1 [facility]    Lab/Order associations:   ICD-10-CM   1. Acute cystitis with hematuria  N30.01     2. Cloudy urine  R82.90 POCT Urinalysis Dipstick (Automated)    Urine Culture      Meds ordered this encounter  Medications   cephALEXin (KEFLEX) 500 MG capsule    Sig: Take 1 capsule (500 mg total) by mouth 3 (three) times daily for 7 days.    Dispense:  21 capsule    Refill:  0    Return precautions advised.  Tana Conch, MD

## 2023-08-19 LAB — URINE CULTURE
MICRO NUMBER:: 15964611
SPECIMEN QUALITY:: ADEQUATE

## 2023-08-21 ENCOUNTER — Encounter: Payer: Self-pay | Admitting: Family Medicine

## 2023-08-27 ENCOUNTER — Other Ambulatory Visit: Payer: Self-pay | Admitting: Family Medicine

## 2023-08-27 DIAGNOSIS — E1142 Type 2 diabetes mellitus with diabetic polyneuropathy: Secondary | ICD-10-CM

## 2023-09-13 DIAGNOSIS — G894 Chronic pain syndrome: Secondary | ICD-10-CM | POA: Diagnosis not present

## 2023-09-13 DIAGNOSIS — G629 Polyneuropathy, unspecified: Secondary | ICD-10-CM | POA: Diagnosis not present

## 2023-09-13 DIAGNOSIS — E1142 Type 2 diabetes mellitus with diabetic polyneuropathy: Secondary | ICD-10-CM | POA: Diagnosis not present

## 2023-09-13 DIAGNOSIS — M48061 Spinal stenosis, lumbar region without neurogenic claudication: Secondary | ICD-10-CM | POA: Diagnosis not present

## 2023-09-15 ENCOUNTER — Other Ambulatory Visit: Payer: Self-pay | Admitting: Family Medicine

## 2023-09-15 DIAGNOSIS — I10 Essential (primary) hypertension: Secondary | ICD-10-CM

## 2023-09-15 DIAGNOSIS — E782 Mixed hyperlipidemia: Secondary | ICD-10-CM

## 2023-09-21 DIAGNOSIS — C44319 Basal cell carcinoma of skin of other parts of face: Secondary | ICD-10-CM | POA: Diagnosis not present

## 2023-09-21 DIAGNOSIS — Z85828 Personal history of other malignant neoplasm of skin: Secondary | ICD-10-CM | POA: Diagnosis not present

## 2023-09-21 DIAGNOSIS — D485 Neoplasm of uncertain behavior of skin: Secondary | ICD-10-CM | POA: Diagnosis not present

## 2023-09-21 DIAGNOSIS — L821 Other seborrheic keratosis: Secondary | ICD-10-CM | POA: Diagnosis not present

## 2023-09-21 DIAGNOSIS — Z08 Encounter for follow-up examination after completed treatment for malignant neoplasm: Secondary | ICD-10-CM | POA: Diagnosis not present

## 2023-09-21 DIAGNOSIS — L814 Other melanin hyperpigmentation: Secondary | ICD-10-CM | POA: Diagnosis not present

## 2023-09-21 DIAGNOSIS — D225 Melanocytic nevi of trunk: Secondary | ICD-10-CM | POA: Diagnosis not present

## 2023-10-26 DIAGNOSIS — C44319 Basal cell carcinoma of skin of other parts of face: Secondary | ICD-10-CM | POA: Diagnosis not present

## 2023-11-29 ENCOUNTER — Encounter: Payer: Self-pay | Admitting: Family Medicine

## 2023-11-29 ENCOUNTER — Ambulatory Visit: Payer: PPO | Admitting: Family Medicine

## 2023-11-29 VITALS — BP 126/63 | HR 77 | Temp 97.8°F | Ht 72.0 in | Wt 159.2 lb

## 2023-11-29 DIAGNOSIS — G4701 Insomnia due to medical condition: Secondary | ICD-10-CM | POA: Diagnosis not present

## 2023-11-29 DIAGNOSIS — E782 Mixed hyperlipidemia: Secondary | ICD-10-CM | POA: Diagnosis not present

## 2023-11-29 DIAGNOSIS — I10 Essential (primary) hypertension: Secondary | ICD-10-CM

## 2023-11-29 DIAGNOSIS — E538 Deficiency of other specified B group vitamins: Secondary | ICD-10-CM | POA: Diagnosis not present

## 2023-11-29 DIAGNOSIS — E1142 Type 2 diabetes mellitus with diabetic polyneuropathy: Secondary | ICD-10-CM

## 2023-11-29 DIAGNOSIS — M5416 Radiculopathy, lumbar region: Secondary | ICD-10-CM | POA: Diagnosis not present

## 2023-11-29 DIAGNOSIS — K5190 Ulcerative colitis, unspecified, without complications: Secondary | ICD-10-CM | POA: Diagnosis not present

## 2023-11-29 DIAGNOSIS — E119 Type 2 diabetes mellitus without complications: Secondary | ICD-10-CM | POA: Diagnosis not present

## 2023-11-29 DIAGNOSIS — M48062 Spinal stenosis, lumbar region with neurogenic claudication: Secondary | ICD-10-CM | POA: Diagnosis not present

## 2023-11-29 DIAGNOSIS — Z7984 Long term (current) use of oral hypoglycemic drugs: Secondary | ICD-10-CM

## 2023-11-29 DIAGNOSIS — Z0001 Encounter for general adult medical examination with abnormal findings: Secondary | ICD-10-CM

## 2023-11-29 DIAGNOSIS — E1169 Type 2 diabetes mellitus with other specified complication: Secondary | ICD-10-CM | POA: Diagnosis not present

## 2023-11-29 LAB — LIPID PANEL
Cholesterol: 145 mg/dL (ref 0–200)
HDL: 65.9 mg/dL (ref >39.00)
LDL Cholesterol: 66 mg/dL (ref 0–99)
NonHDL: 79.45
Total CHOL/HDL Ratio: 2
Triglycerides: 68 mg/dL (ref 0.0–149.0)
VLDL: 13.6 mg/dL (ref 0.0–40.0)

## 2023-11-29 LAB — COMPREHENSIVE METABOLIC PANEL WITH GFR
ALT: 17 U/L (ref 0–53)
AST: 17 U/L (ref 0–37)
Albumin: 4.8 g/dL (ref 3.5–5.2)
Alkaline Phosphatase: 44 U/L (ref 39–117)
BUN: 28 mg/dL — ABNORMAL HIGH (ref 6–23)
CO2: 32 meq/L (ref 19–32)
Calcium: 9.7 mg/dL (ref 8.4–10.5)
Chloride: 101 meq/L (ref 96–112)
Creatinine, Ser: 1.21 mg/dL (ref 0.40–1.50)
GFR: 58.63 mL/min — ABNORMAL LOW (ref >60.00)
Glucose, Bld: 116 mg/dL — ABNORMAL HIGH (ref 70–99)
Potassium: 4.4 meq/L (ref 3.5–5.1)
Sodium: 140 meq/L (ref 135–145)
Total Bilirubin: 0.8 mg/dL (ref 0.2–1.2)
Total Protein: 7.1 g/dL (ref 6.0–8.3)

## 2023-11-29 LAB — VITAMIN B12: Vitamin B-12: 445 pg/mL (ref 211–911)

## 2023-11-29 LAB — CBC WITH DIFFERENTIAL/PLATELET
Basophils Absolute: 0 10*3/uL (ref 0.0–0.1)
Basophils Relative: 0.7 % (ref 0.0–3.0)
Eosinophils Absolute: 0.3 10*3/uL (ref 0.0–0.7)
Eosinophils Relative: 6.4 % — ABNORMAL HIGH (ref 0.0–5.0)
HCT: 38.2 % — ABNORMAL LOW (ref 39.0–52.0)
Hemoglobin: 13.2 g/dL (ref 13.0–17.0)
Lymphocytes Relative: 22.2 % (ref 12.0–46.0)
Lymphs Abs: 1 10*3/uL (ref 0.7–4.0)
MCHC: 34.6 g/dL (ref 30.0–36.0)
MCV: 95 fl (ref 78.0–100.0)
Monocytes Absolute: 0.4 10*3/uL (ref 0.1–1.0)
Monocytes Relative: 8.3 % (ref 3.0–12.0)
Neutro Abs: 2.7 10*3/uL (ref 1.4–7.7)
Neutrophils Relative %: 62.4 % (ref 43.0–77.0)
Platelets: 169 10*3/uL (ref 150.0–400.0)
RBC: 4.02 Mil/uL — ABNORMAL LOW (ref 4.22–5.81)
RDW: 13.2 % (ref 11.5–15.5)
WBC: 4.4 10*3/uL (ref 4.0–10.5)

## 2023-11-29 LAB — HEMOGLOBIN A1C: Hgb A1c MFr Bld: 6.5 % (ref 4.6–6.5)

## 2023-11-29 LAB — MICROALBUMIN / CREATININE URINE RATIO
Creatinine,U: 54.3 mg/dL
Microalb Creat Ratio: UNDETERMINED mg/g (ref 0.0–30.0)
Microalb, Ur: <0.7 mg/dL

## 2023-11-29 LAB — TSH: TSH: 1 u[IU]/mL (ref 0.35–5.50)

## 2023-11-29 NOTE — Progress Notes (Signed)
 Subjective  Chief Complaint  Patient presents with   Annual Exam    Pt here for Annual Exam and is not currrently fasting    Diabetes    HPI: Shannon Hodge is a 78 y.o. male who presents to [facility] [place] today for a Male Wellness Visit. He also has the concerns and/or needs as listed above in the chief complaint. These will be addressed in addition to the Health Maintenance Visit.   Wellness Visit: annual visit with health maintenance review and exam   HM: screens are current. Exercises and eats healthy. Doing well overall. Had mohs' for a basal cell on right face two weeks ago.   Body mass index is 21.59 kg/m. Wt Readings from Last 3 Encounters:  11/29/23 159 lb 3.2 oz (72.2 kg)  08/17/23 167 lb 6.4 oz (75.9 kg)  08/14/23 167 lb (75.8 kg)   Chronic disease management visit and/or acute problem visit: Discussed the use of AI scribe software for clinical note transcription with the patient, who gave verbal consent to proceed.  History of Present Illness Shannon Vallier "Jayme" is a 78 year old male who presents with sleep disturbances and post-Mohs surgery status.  Diabetes follow-up: Continues to eat well.  No symptoms of hyperglycemia.  Has been well-controlled on metformin .  Long history of neuropathy on gabapentin .  We increase his dose at nighttime at last visit and he thinks this has helped marginally.  No adverse side effects.  No foot sores.  Eye exam up-to-date.  No retinopathy.  He is on an ACE inhibitor.  He does take a statin.  Immunizations are current  Hypertension follow-up on lisinopril , well-controlled.  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  Stable.  Hyperlipidemia on statin.  Tolerates.  Fasting for recheck today.  Goal LDL less than 70  Ulcerative colitis managed per GI.  He reports fortunately this is stable currently.   History of B12 deficiency on oral supplements due for recheck.   Assessment  1. Encounter for well adult exam with abnormal findings   2. Controlled type 2 diabetes mellitus with diabetic polyneuropathy, without long-term current use of insulin (HCC)   3. Diabetic polyneuropathy associated with type 2 diabetes mellitus (HCC)   4. Essential hypertension   5. Insomnia due to medical condition   6. Lumbar radiculopathy   7. Combined hyperlipidemia associated with type 2 diabetes mellitus (HCC)   8. Diabetes mellitus treated with oral medication (HCC)   9. Spinal stenosis of lumbar region with neurogenic claudication   10. Ulcerative colitis without complications, unspecified location (HCC)   11. Vitamin B12 deficiency      Plan  Male Wellness Visit: Age appropriate Health Maintenance and Prevention measures were discussed with patient. Included topics are cancer screening recommendations, ways to keep healthy (see AVS) including dietary and exercise recommendations, regular eye and dental care, use of seat belts, and avoidance of moderate alcohol use and tobacco use.  BMI: discussed patient's BMI and encouraged positive lifestyle modifications to help get to or maintain a target BMI. HM needs and immunizations were addressed and ordered. See below for orders. See HM and immunization section for updates. Routine labs and screening tests ordered including cmp, cbc and lipids where appropriate. Discussed recommendations regarding Vit D and calcium supplementation (see AVS)  Chronic disease f/u and/or acute problem visit: (deemed necessary to be done  in addition to the wellness visit): Assessment and Plan Assessment & Plan Back pain/lumbar stenosis/idiopathic peripheral neuropathy per Ortho Chronic back pain affecting sleep. Gabapentin  increased to 2400 mg, slowing symptom progression. Tolerating dose well. - Consider increasing gabapentin  dose if tolerated.  Sleep disorder, secondary  insomnia Chronic sleep disorder with interruptions. Slight improvement with increased gabapentin . Sleep affected by back pain and energy expenditure. - Encourage morning sunlight exposure before 10 AM to help regulate circadian rhythm.  Diabetes: Has been well-controlled on metformin  XR 2000 daily.  Recheck A1c today.  Check renal function.  Continue statin and ACE.  Recheck urine nephropathy screening  Hypertension is controlled on lisinopril .  Monitor potassium and renal function  Hyperlipidemia on statin: Fasting for recheck with LFTs today.  Recheck B12 deficiency levels  Wellness Visit Routine wellness visit. Blood pressure controlled. Fasting blood work planned. Colon cancer screening and immunizations up to date. Recent eye exam normal. - Order fasting blood work including A1c, chemistries, blood levels, blood counts, cholesterol, and diabetes test. - Review blood work results and communicate findings. - Continue routine health maintenance.   Follow up: 6 mo for recheck Commons side effects, risks, benefits, and alternatives for medications and treatment plan prescribed today were discussed, and the patient expressed understanding of the given instructions. Patient is instructed to call or message via MyChart if he/she has any questions or concerns regarding our treatment plan. No barriers to understanding were identified. We discussed Red Flag symptoms and signs in detail. Patient expressed understanding regarding what to do in case of urgent or emergency type symptoms.  Medication list was reconciled, printed and provided to the patient in AVS. Patient instructions and summary information was reviewed with the patient as documented in the AVS. This note was prepared with assistance of Dragon voice recognition software. Occasional wrong-word or sound-a-like substitutions may have occurred due to the inherent limitations of voice recognition software  Orders Placed This Encounter   Procedures   CBC with Differential/Platelet   Comprehensive metabolic panel with GFR   Lipid panel   Hemoglobin A1c   TSH   Microalbumin / creatinine urine ratio   Vitamin B12   No orders of the defined types were placed in this encounter.    Patient Active Problem List   Diagnosis Date Noted   Ulcerative colitis (HCC) 10/26/2020   Essential hypertension 04/01/2020   Spinal stenosis of lumbar region 04/01/2020   Diabetic polyneuropathy associated with type 2 diabetes mellitus (HCC) 01/01/2019   Restless leg syndrome 09/21/2017   Mixed hyperlipidemia 09/21/2017   Controlled type 2 diabetes mellitus with diabetic polyneuropathy, without long-term current use of insulin (HCC) 09/01/2016   Insomnia 02/22/2016   Lumbar radiculopathy 11/04/2014   Hearing loss 09/21/2017   Muscle cramp, nocturnal 09/21/2017   Foot drop, left 09/21/2017   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   Vitamin B12 deficiency 09/21/2017   Allergic rhinitis 02/22/2016   Genital herpes 02/22/2016   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   Health Maintenance  Topic Date Due   Diabetic kidney evaluation - eGFR measurement  11/03/2023   Diabetic kidney evaluation - Urine ACR  11/03/2023   HEMOGLOBIN A1C  11/29/2023   Medicare Annual Wellness (AWV)  01/10/2024   INFLUENZA VACCINE  03/01/2024   COVID-19 Vaccine (10 - 2024-25 season) 05/09/2024   FOOT EXAM  05/30/2024   OPHTHALMOLOGY EXAM  07/04/2024   Colonoscopy  06/19/2030   Pneumonia Vaccine 9+ Years old  Completed   Hepatitis C Screening  Completed   Zoster Vaccines- Shingrix  Completed   HPV VACCINES  Aged Out   Meningococcal B Vaccine  Aged Out   DTaP/Tdap/Td  Discontinued   Immunization History  Administered Date(s) Administered   Fluad Quad(high Dose 65+) 03/30/2020, 04/29/2022   Influenza, High Dose Seasonal PF 05/09/2016, 05/04/2017, 04/12/2018, 03/30/2020, 04/09/2021, 04/29/2022, 04/14/2023   Influenza,inj,Quad PF,6+ Mos 04/05/2019   Influenza-Unspecified 05/01/2015    PFIZER Comirnaty(Gray Top)Covid-19 Tri-Sucrose Vaccine 10/28/2020, 04/29/2022   PFIZER(Purple Top)SARS-COV-2 Vaccination 08/22/2019, 09/12/2019, 03/30/2020   Pfizer Covid-19 Vaccine Bivalent Booster 72yrs & up 04/09/2021, 11/23/2021, 10/20/2022   Pfizer(Comirnaty)Fall Seasonal Vaccine 12 years and older 11/08/2023   Pneumococcal Conjugate-13 10/03/2016   Pneumococcal Polysaccharide-23 01/27/2011, 09/21/2017   Respiratory Syncytial Virus Vaccine,Recomb Aduvanted(Arexvy) 08/28/2022   Tdap 01/27/2011   Zoster Recombinant(Shingrix) 06/25/2018, 09/06/2018   Zoster, Live 01/19/2010   We updated and reviewed the patient's past history in detail and it is documented below. Allergies: Patient has no known allergies. Past Medical History  has a past medical history of Basal cell carcinoma, Cataract, Diabetes (HCC), Diabetic neuropathy (HCC), DJD (degenerative joint disease), Hypercholesteremia, Hyperlipidemia, Hypertension, Inflammatory bowel disease (10/26/2020), Neuropathy, Ruptured Achilles tendon due to trauma (10/2016), and Ulcerative proctitis (HCC). Past Surgical History Patient  has a past surgical history that includes colonscopy; Colonoscopy with propofol  (N/A, 11/01/2016); Esophagogastroduodenoscopy (egd) with propofol  (N/A, 11/01/2016); Tonsillectomy; basil cell; cortisone injection (04/2021); and Peripheral Nerve Stimulation (N/A, 03/2023). Social History Patient  reports that he has never smoked. He has never used smokeless tobacco. He reports that he does not drink alcohol and does not use drugs. Family History family history includes Brain cancer in his father; Diabetes in his father, maternal grandfather, maternal grandmother, paternal grandfather, and paternal grandmother; Healthy in his daughter and son; Pancreatic cancer in his mother; Stroke in his father. Review of Systems: Constitutional: negative for fever or malaise Ophthalmic: negative for photophobia, double vision or loss  of vision Cardiovascular: negative for chest pain, dyspnea on exertion, or new LE swelling Respiratory: negative for SOB or persistent cough Gastrointestinal: negative for abdominal pain, change in bowel habits or melena Genitourinary: negative for dysuria or gross hematuria Musculoskeletal: negative for new gait disturbance or muscular weakness Integumentary: negative for new or persistent rashes Neurological: negative for TIA or stroke symptoms Psychiatric: negative for SI or delusions Allergic/Immunologic: negative for hives  Patient Care Team    Relationship Specialty Notifications Start End  Luevenia Saha, MD PCP - General Family Medicine  09/21/17   Omega Bible, MD Consulting Physician Neurology  09/21/17   Garrett Kallman, MD Consulting Physician Gastroenterology  09/21/17   Homero Luster, MD Attending Physician Urology  02/23/18   Osa Blase, MD Consulting Physician Orthopedic Surgery  09/05/18   Terrilyn Fick, NP Nurse Practitioner Neurology  10/07/19   Ranny Bye, OD Consulting Physician Optometry  10/07/19   Myrle Aspen, [facility] (Inactive)  Pharmacist  08/31/21    Comment: [phone]  Lindle Rhea, MD (Inactive) Consulting Physician Gastroenterology  08/23/22    Objective  Vitals: BP 126/63   Pulse 77   Temp 97.8 F (36.6 C)   Ht 6' (1.829 m)   Wt 159 lb 3.2 oz (72.2 kg)   SpO2 100%   BMI 21.59 kg/m  General:  Well developed, well nourished, no acute distress  Psych:  Alert and orientedx3,normal mood and affect HEENT:  Normocephalic, atraumatic, non-icteric sclera,  oropharynx is clear without mass or exudate, supple neck without adenopathy, or thyromegaly Cardiovascular:  Normal S1, S2, RRR without gallop, rub or  murmur,  Respiratory:  Good breath sounds bilaterally, CTAB with normal respiratory effort Gastrointestinal: normal bowel sounds, soft, non-tender, no noted masses. No HSM MSK: Joints are without erythema or swelling.  Skin:  Warm, no  rashes Neurologic:    Mental status is normal.  Gross motor and sensory exams are normal. Stable gait. No tremor

## 2023-11-29 NOTE — Patient Instructions (Signed)
 Please return in 6 months for recheck  I will release your lab results to you on your MyChart account with further instructions. You may see the results before I do, but when I review them I will send you a message with my report or have my assistant call you if things need to be discussed. Please reply to my message with any questions. Thank you!   If you have any questions or concerns, please don't hesitate to send me a message via MyChart or call the office at [phone]. Thank you for visiting with us  today! It's our pleasure caring for you.   YOUR PLAN: -BACK PAIN: Chronic back pain can affect your sleep. We have increased your gabapentin  dose, which seems to be helping. If you tolerate this dose well, we may consider increasing it further.  -SLEEP DISORDER: A sleep disorder is when you have trouble sleeping well on a regular basis. Your sleep has slightly improved with the increased gabapentin . To help regulate your sleep, continue getting sunlight exposure before 10 AM.  -POST-SURGICAL NUMBNESS: Post-surgical numbness is a common sensation after surgery. Your surgical site is healing well, though you have slight numbness and puffiness. Monitor the site for healing, and you can cancel your follow-up appointment if no concerns arise.  -WELLNESS VISIT: This was a routine wellness visit. Your blood pressure is controlled, and you are fasting for blood work, including an A1c test. Your colon cancer screening and immunizations are up to date, and your recent eye exam was normal. We will review your blood work results and communicate the findings. Continue with your routine health maintenance.  INSTRUCTIONS: Please monitor your surgical site for healing and cancel your follow-up appointment if no concerns arise. Continue getting sunlight exposure before 10 AM to help with your sleep. We will review your blood work results and communicate the findings to  you.                      Contains text generated by Abridge.                                 Contains text generated by Abridge.

## 2023-12-07 ENCOUNTER — Encounter: Payer: Self-pay | Admitting: Family Medicine

## 2023-12-11 ENCOUNTER — Telehealth: Payer: Self-pay

## 2023-12-11 NOTE — Telephone Encounter (Signed)
 Copied from CRM [id number]. Topic: Clinical - Lab/Test Results >> Dec 11, 2023  2:50 PM Jenice Mitts wrote: Reason for CRM: Patient is calling for lab results on 4/30 he stated can call or message him on mychart either is fine  Phone number on file is accurate  Pt requesting lab results.  Message has been sent to provider to address

## 2023-12-12 ENCOUNTER — Ambulatory Visit: Payer: Self-pay | Admitting: Family Medicine

## 2023-12-12 NOTE — Progress Notes (Signed)
 See mychart note Dear Mr. Blinn, Your lab results are all stable. They all look good. No changes are needed at this time. Thank you again for the CD; it's a wonderful thing.   Sincerely, Dr. Jonelle Neri

## 2023-12-12 NOTE — Telephone Encounter (Signed)
 Sent [employer]. Stable results.

## 2024-01-05 ENCOUNTER — Encounter: Payer: Self-pay | Admitting: Family Medicine

## 2024-01-07 ENCOUNTER — Encounter: Payer: Self-pay | Admitting: Internal Medicine

## 2024-01-08 ENCOUNTER — Other Ambulatory Visit: Payer: Self-pay

## 2024-01-08 DIAGNOSIS — E1142 Type 2 diabetes mellitus with diabetic polyneuropathy: Secondary | ICD-10-CM

## 2024-01-08 MED ORDER — GABAPENTIN 600 MG PO TABS: ORAL_TABLET | ORAL | 5 refills | Status: AC

## 2024-01-17 ENCOUNTER — Ambulatory Visit (INDEPENDENT_AMBULATORY_CARE_PROVIDER_SITE_OTHER)

## 2024-01-17 VITALS — Ht 72.0 in | Wt 164.0 lb

## 2024-01-17 DIAGNOSIS — Z0000 Encounter for general adult medical examination without abnormal findings: Secondary | ICD-10-CM

## 2024-01-17 NOTE — Patient Instructions (Signed)
 [name] , Thank you for taking time out of your busy schedule to complete your Annual Wellness Visit with me. I enjoyed our conversation and look forward to speaking with you again next year. I, as well as your care team,  appreciate your ongoing commitment to your health goals. Please review the following plan we discussed and let me know if I can assist you in the future. Your Game plan/ To Do List    Referrals: If you haven't heard from the office you've been referred to, please reach out to them at the phone provided.   Follow up Visits: Next Medicare AWV with our clinical staff: 01/21/25   Have you seen your provider in the last 6 months (3 months if uncontrolled diabetes)? Yes Next Office Visit with your provider: pt stated he will schedule closer to time for yearly physical   Clinician Recommendations:  Aim for 30 minutes of exercise or brisk walking, 6-8 glasses of water, and 5 servings of fruits and vegetables each day.       This is a list of the screening recommended for you and due dates:  Health Maintenance  Topic Date Due   Medicare Annual Wellness Visit  01/10/2024   Flu Shot  03/01/2024   COVID-19 Vaccine (10 - 2024-25 season) 05/09/2024   Complete foot exam   05/30/2024   Hemoglobin A1C  05/30/2024   Eye exam for diabetics  07/04/2024   Yearly kidney function blood test for diabetes  11/28/2024   Yearly kidney health urinalysis for diabetes  11/28/2024   Colon Cancer Screening  06/19/2030   Pneumococcal Vaccine for age over 68  Completed   Hepatitis C Screening  Completed   Zoster (Shingles) Vaccine  Completed   HPV Vaccine  Aged Out   Meningitis B Vaccine  Aged Out   DTaP/Tdap/Td vaccine  Discontinued    Advanced directives: (Copy Requested) Please bring a copy of your health care power of attorney and living will to the office to be added to your chart at your convenience. You can mail to [facility] [address] [place], [place] [address] or  email to [email or web address] Advance Care Planning is important because it:  [x]  Makes sure you receive the medical care that is consistent with your values, goals, and preferences  [x]  It provides guidance to your family and loved ones and reduces their decisional burden about whether or not they are making the right decisions based on your wishes.  Follow the link provided in your after visit summary or read over the paperwork we have mailed to you to help you started getting your Advance Directives in place. If you need assistance in completing these, please reach out to us  so that we can help you!  See attachments for Preventive Care and Fall Prevention Tips.

## 2024-01-17 NOTE — Progress Notes (Signed)
 Subjective:   Shannon Hodge is a 78 y.o. who presents for a Medicare Wellness preventive visit.  As a reminder, Annual Wellness Visits don't include a physical exam, and some assessments may be limited, especially if this visit is performed virtually. We may recommend an in-person follow-up visit with your provider if needed.  Visit Complete: Virtual I connected with  Dorn Gaskins on 01/17/24 by a audio enabled telemedicine application and verified that I am speaking with the correct person using two identifiers.  Patient Location: Home  Provider Location: Home Office  I discussed the limitations of evaluation and management by telemedicine. The patient expressed understanding and agreed to proceed.  Vital Signs: Because this visit was a virtual/telehealth visit, some criteria may be missing or patient reported. Any vitals not documented were not able to be obtained and vitals that have been documented are patient reported.  VideoDeclined- This patient declined Librarian, academic. Therefore the visit was completed with audio only.  Persons Participating in Visit: Patient.  AWV Questionnaire: No: Patient Medicare AWV questionnaire was not completed prior to this visit.  Cardiac Risk Factors include: advanced age (>63men, >69 women);hypertension;diabetes mellitus;dyslipidemia;male gender     Objective:    Today's Vitals   01/17/24 1349  Weight: 164 lb (74.4 kg)  Height: 6' (1.829 m)  PainSc: 3    Body mass index is 22.24 kg/m.     01/17/2024    1:57 PM 01/10/2023    1:40 PM 01/14/2022    9:55 AM 01/08/2021    9:44 AM 10/07/2019    2:46 PM 09/05/2018    9:39 AM 11/01/2016    9:40 AM  Advanced Directives  Does Patient Have a Medical Advance Directive? Yes Yes Yes Yes Yes Yes  Yes   Type of Estate agent of [place];Living will Healthcare Power of [place];Living will Healthcare Power of Attorney Living will Living will;Healthcare Power  of [employer] Power of [place];Living will Healthcare Power of [place];Living will  Does patient want to make changes to medical advance directive?     No - Patient declined    Copy of Healthcare Power of Attorney in Chart? No - copy requested No - copy requested No - copy requested  No - copy requested No - copy requested  No - copy requested      Data saved with a previous flowsheet row definition    Current Medications (verified) Outpatient Encounter Medications as of 01/17/2024  Medication Sig   Alpha-Lipoic Acid 600 MG TABS Take by mouth.   Ascorbic Acid (VITAMIN C PO) Take by mouth.   Blood Glucose Monitoring Suppl (ONE TOUCH ULTRA 2) w/Device KIT USE AS DIRECTED   celecoxib (CELEBREX) 100 MG capsule Take 100 mg by mouth 2 (two) times daily.   cholecalciferol (VITAMIN D) 1000 units tablet Take 1,000 Units by mouth daily.   Coenzyme Q10 (COQ10) 100 MG CAPS Take 1 capsule by mouth daily.   COMIRNATY syringe    Cyanocobalamin  (VITAMIN B-12 PO) Take by mouth daily.   fenofibrate  micronized (LOFIBRA) 134 MG capsule TAKE 1 CAPSULE BY MOUTH EVERY DAY BEFORE BREAKFAST   Ferrous Sulfate (IRON PO) Take by mouth.   gabapentin  (NEURONTIN ) 600 MG tablet Take 1 tablet (600 mg total) by mouth 2 (two) times daily AND 2 tablets (1,200 mg total) at bedtime.   glucose blood (FREESTYLE LITE) test strip Use as instructed   Lancets (ONETOUCH ULTRASOFT) lancets Use as instructed   lisinopril  (ZESTRIL ) 20 MG tablet  TAKE 1 TABLET BY MOUTH EVERY DAY   Magnesium Oxide 500 MG TABS Take by mouth.   mesalamine  (LIALDA ) 1.2 g EC tablet TAKE 2 TABLETS BY MOUTH DAILY WITH BREAKFAST.   metFORMIN  (GLUCOPHAGE -XR) 500 MG 24 hr tablet TAKE 4 TABLETS (2,000 MG TOTAL) BY MOUTH DAILY   simvastatin  (ZOCOR ) 40 MG tablet TAKE 1 TABLET BY MOUTH EVERYDAY AT BEDTIME   tiZANidine (ZANAFLEX) 2 MG tablet    valACYclovir  (VALTREX ) 500 MG tablet Take 1 tablet (500 mg total) by mouth daily as needed (outbreak).   No  facility-administered encounter medications on file as of 01/17/2024.    Allergies (verified) Patient has no known allergies.   History: Past Medical History:  Diagnosis Date   Basal cell carcinoma    upper back   Cataract    right eye   Diabetes (HCC)    type 2   Diabetic neuropathy (HCC)    both legs   DJD (degenerative joint disease)    L 5   Hypercholesteremia    Hyperlipidemia    Hypertension    Inflammatory bowel disease 10/26/2020   Unspecified. Seeing [facility]   Neuropathy    Ruptured Achilles tendon due to trauma 10/2016   Ulcerative proctitis [facility])    Past Surgical History:  Procedure Laterality Date   basil cell     basil cell/mole removed from back   COLONOSCOPY WITH PROPOFOL  N/A 11/01/2016   Procedure: COLONOSCOPY WITH PROPOFOL ;  Surgeon: Garrett Kallman, MD;  Location: [facility];  Service: Endoscopy;  Laterality: N/A;   colonscopy     x 2   cortisone injection  04/2021   lower back   ESOPHAGOGASTRODUODENOSCOPY (EGD) WITH PROPOFOL  N/A 11/01/2016   Procedure: ESOPHAGOGASTRODUODENOSCOPY (EGD) WITH PROPOFOL ;  Surgeon: Garrett Kallman, MD;  Location: [facility];  Service: Endoscopy;  Laterality: N/A;   Peripheral Nerve Stimulation N/A 03/2023   TONSILLECTOMY     Family History  Problem Relation Age of Onset   Pancreatic cancer Mother    Stroke Father    Brain cancer Father    Diabetes Father    Healthy Daughter    Healthy Son    Diabetes Maternal Grandmother    Diabetes Maternal Grandfather    Diabetes Paternal Grandmother    Diabetes Paternal Grandfather    Colon cancer Neg Hx    Colon polyps Neg Hx    Esophageal cancer Neg Hx    Stomach cancer Neg Hx    Social History   Socioeconomic History   Marital status: Married    Spouse name: Diane    Number of children: 3   Years of education: post grad   Highest education level: Not on file  Occupational History   Occupation: retired Charity fundraiser  Tobacco Use   Smoking status: Never    Smokeless tobacco: Never  Vaping Use   Vaping status: Never Used  Substance and Sexual Activity   Alcohol use: No   Drug use: No   Sexual activity: Not Currently  Other Topics Concern   Not on file  Social History Narrative   Lives with wife    Drinks 2 cups of coffee a day   Social Drivers of Health   Financial Resource Strain: Low Risk  (01/17/2024)   Overall Financial Resource Strain (CARDIA)    Difficulty of Paying Living Expenses: Not hard at all  Food Insecurity: No Food Insecurity (01/17/2024)   Hunger Vital Sign    Worried About Running Out of Food in the  Last Year: Never true    Ran Out of Food in the Last Year: Never true  Transportation Needs: No Transportation Needs (01/17/2024)   PRAPARE - Administrator, Civil Service (Medical): No    Lack of Transportation (Non-Medical): No  Physical Activity: Sufficiently Active (01/17/2024)   Exercise Vital Sign    Days of Exercise per Week: 5 days    Minutes of Exercise per Session: 30 min  Stress: No Stress Concern Present (01/17/2024)   [employer] of Occupational Health - Occupational Stress Questionnaire    Feeling of Stress: Not at all  Social Connections: Moderately Integrated (01/17/2024)   Social Connection and Isolation Panel    Frequency of Communication with Friends and Family: More than three times a week    Frequency of Social Gatherings with Friends and Family: More than three times a week    Attends Religious Services: Never    Database administrator or Organizations: Yes    Attends Engineer, structural: 1 to 4 times per year    Marital Status: Married    Tobacco Counseling Counseling given: Not Answered    Clinical Intake:  Pre-visit preparation completed: Yes  Pain : 0-10 Pain Score: 3  Pain Type: Chronic pain Pain Location: Genitalia Pain Descriptors / Indicators: Aching, Sore     BMI - recorded: 22.24 Nutritional Status: BMI of 19-24  Normal Diabetes: Yes CBG  done?: No Did pt. bring in CBG monitor from home?: No  Lab Results  Component Value Date   HGBA1C 6.5 11/29/2023   HGBA1C 6.2 (A) 05/31/2023   HGBA1C 6.3 11/03/2022     How often do you need to have someone help you when you read instructions, pamphlets, or other written materials from your doctor or pharmacy?: 1 - Never  Interpreter Needed?: No  Information entered by :: Lamont Pilsner, LPN   Activities of Daily Living     01/17/2024    1:56 PM  In your present state of health, do you have any difficulty performing the following activities:  Hearing? 1  Comment hearing aids  Vision? 0  Difficulty concentrating or making decisions? 0  Walking or climbing stairs? 0  Dressing or bathing? 0  Doing errands, shopping? 0  Preparing Food and eating ? N  Using the Toilet? N  In the past six months, have you accidently leaked urine? N  Do you have problems with loss of bowel control? N  Managing your Medications? N  Managing your Finances? N  Housekeeping or managing your Housekeeping? N    Patient Care Team: Luevenia Saha, MD as PCP - General (Family Medicine) Omega Bible, MD as Consulting Physician (Neurology) Garrett Kallman, MD as Consulting Physician (Gastroenterology) Homero Luster, MD as Attending Physician (Urology) Osa Blase, MD as Consulting Physician (Orthopedic Surgery) Terrilyn Fick, NP as Nurse Practitioner (Neurology) Ranny Bye, OD as Consulting Physician (Optometry) [place], Fredricka Jenny, [facility] (Inactive) (Pharmacist) Lindle Rhea, MD (Inactive) as Consulting Physician (Gastroenterology)  I have updated your Care Teams any recent Medical Services you may have received from other providers in the past year.     Assessment:   This is a routine wellness examination for Shannon Hodge.  Hearing/Vision screen Hearing Screening - Comments:: Hearing aids  Vision Screening - Comments:: Wears rx glasses - up to date with routine eye exams with Dr [facility]     Goals Addressed  This Visit's Progress    Patient Stated       Maintain health and activity        Depression Screen     01/17/2024    1:58 PM 11/29/2023    8:30 AM 07/19/2023    1:31 PM 05/31/2023    9:52 AM 01/10/2023    1:38 PM 12/06/2022   10:50 AM 11/03/2022    9:01 AM  PHQ 2/9 Scores  PHQ - 2 Score 0 0 0 0 0 0 1    Fall Risk     01/17/2024    2:01 PM 11/29/2023    8:30 AM 07/19/2023    1:31 PM 05/31/2023    9:52 AM 01/06/2023    8:50 AM  Fall Risk   Falls in the past year? 0 0 0 0 1  Number falls in past yr: 0 0 0 0 0  Injury with Fall? 0 0 0 0 0  Risk for fall due to : No Fall Risks No Fall Risks No Fall Risks No Fall Risks Impaired vision  Follow up Falls prevention discussed Falls evaluation completed Falls evaluation completed Falls evaluation completed Falls prevention discussed    MEDICARE RISK AT HOME:  Medicare Risk at Home Any stairs in or around the home?: Yes If so, are there any without handrails?: No Home free of loose throw rugs in walkways, pet beds, electrical cords, etc?: Yes Adequate lighting in your home to reduce risk of falls?: Yes Life alert?: No Use of a cane, walker or w/c?: No Grab bars in the bathroom?: Yes Shower chair or bench in shower?: No Elevated toilet seat or a handicapped toilet?: No  TIMED UP AND GO:  Was the test performed?  No  Cognitive Function: 6CIT completed    09/05/2018    9:40 AM  MMSE - Mini Mental State Exam  Orientation to time 5  Orientation to Place 5  Registration 3  Attention/ Calculation 5  Recall 3  Language- name 2 objects 2  Language- repeat 1  Language- follow 3 step command 3  Language- read & follow direction 1  Write a sentence 1  Copy design 1  Total score 30        01/17/2024    2:01 PM 01/10/2023    1:42 PM 01/14/2022    9:59 AM 01/08/2021    9:48 AM 10/07/2019    2:47 PM  6CIT Screen  What Year? 0 points 0 points 0 points 0 points 0 points   What month? 0 points 0 points 0 points 0 points 0 points  What time? 0 points 0 points 0 points 0 points 0 points  Count back from 20 0 points 0 points 0 points 0 points 0 points  Months in reverse 0 points 0 points 0 points 0 points 0 points  Repeat phrase 0 points 0 points 0 points 0 points 0 points  Total Score 0 points 0 points 0 points 0 points 0 points    Immunizations Immunization History  Administered Date(s) Administered   Fluad Quad(high Dose 65+) 03/30/2020, 04/29/2022   Influenza, High Dose Seasonal PF 05/09/2016, 05/04/2017, 04/12/2018, 03/30/2020, 04/09/2021, 04/29/2022, 04/14/2023   Influenza,inj,Quad PF,6+ Mos 04/05/2019   Influenza-Unspecified 05/01/2015   PFIZER Comirnaty(Gray Top)Covid-19 Tri-Sucrose Vaccine 10/28/2020, 04/29/2022   PFIZER(Purple Top)SARS-COV-2 Vaccination 08/22/2019, 09/12/2019, 03/30/2020   Pfizer Covid-19 Vaccine Bivalent Booster 94yrs & up 04/09/2021, 11/23/2021, 10/20/2022   Pfizer(Comirnaty)Fall Seasonal Vaccine 12 years and older 11/08/2023   Pneumococcal Conjugate-13 10/03/2016  Pneumococcal Polysaccharide-23 01/27/2011, 09/21/2017   Respiratory Syncytial Virus Vaccine,Recomb Aduvanted(Arexvy) 08/28/2022   Tdap 01/27/2011   Zoster Recombinant(Shingrix) 06/25/2018, 09/06/2018   Zoster, Live 01/19/2010    Screening Tests Health Maintenance  Topic Date Due   INFLUENZA VACCINE  03/01/2024   COVID-19 Vaccine (10 - 2024-25 season) 05/09/2024   FOOT EXAM  05/30/2024   HEMOGLOBIN A1C  05/30/2024   OPHTHALMOLOGY EXAM  07/04/2024   Diabetic kidney evaluation - eGFR measurement  11/28/2024   Diabetic kidney evaluation - Urine ACR  11/28/2024   Medicare Annual Wellness (AWV)  01/16/2025   Colonoscopy  06/19/2030   Pneumococcal Vaccine: 50+ Years  Completed   Hepatitis C Screening  Completed   Zoster Vaccines- Shingrix  Completed   HPV VACCINES  Aged Out   Meningococcal B Vaccine  Aged Out   DTaP/Tdap/Td  Discontinued    Health  Maintenance  There are no preventive care reminders to display for this patient.  Health Maintenance Items Addressed: See Nurse Notes at the end of this note  Additional Screening:  Vision Screening: Recommended annual ophthalmology exams for early detection of glaucoma and other disorders of the eye. Would you like a referral to an eye doctor? No    Dental Screening: Recommended annual dental exams for proper oral hygiene  Community Resource Referral / Chronic Care Management: CRR required this visit?  No   CCM required this visit?  No   Plan:    I have personally reviewed and noted the following in the patient's chart:   Medical and social history Use of alcohol, tobacco or illicit drugs  Current medications and supplements including opioid prescriptions. Patient is not currently taking opioid prescriptions. Functional ability and status Nutritional status Physical activity Advanced directives List of other physicians Hospitalizations, surgeries, and ER visits in previous 12 months Vitals Screenings to include cognitive, depression, and falls Referrals and appointments  In addition, I have reviewed and discussed with patient certain preventive protocols, quality metrics, and best practice recommendations. A written personalized care plan for preventive services as well as general preventive health recommendations were provided to patient.   Bruno Capri, LPN   [id number]   After Visit Summary: (MyChart) Due to this being a telephonic visit, the after visit summary with patients personalized plan was offered to patient via MyChart   Notes: Nothing significant to report at this time.

## 2024-01-22 ENCOUNTER — Encounter: Payer: Self-pay | Admitting: Family Medicine

## 2024-02-12 ENCOUNTER — Telehealth: Payer: Self-pay

## 2024-02-12 ENCOUNTER — Telehealth: Payer: Self-pay | Admitting: Family Medicine

## 2024-02-12 ENCOUNTER — Other Ambulatory Visit: Payer: Self-pay

## 2024-02-12 ENCOUNTER — Encounter: Payer: Self-pay | Admitting: Internal Medicine

## 2024-02-12 DIAGNOSIS — K529 Noninfective gastroenteritis and colitis, unspecified: Secondary | ICD-10-CM

## 2024-02-12 MED ORDER — MESALAMINE 1.2 G PO TBEC: 4.8000 g | DELAYED_RELEASE_TABLET | Freq: Every day | ORAL | 3 refills | Status: AC

## 2024-02-12 MED ORDER — VALACYCLOVIR HCL 500 MG PO TABS: 500.0000 mg | ORAL_TABLET | Freq: Every day | ORAL | 2 refills | Status: AC | PRN

## 2024-02-12 NOTE — Telephone Encounter (Signed)
 Prescription Request  02/12/2024  LOV: 11/29/2023  What is the name of the medication or equipment? valACYclovir  (VALTREX ) 500 MG tablet   Have you contacted your pharmacy to request a refill? Yes   Which pharmacy would you like this sent to?  [id number] - [place], [place] - [address] [place] [address] Phone: [phone] Fax: [phone]    Patient notified that their request is being sent to the clinical staff for review and that they should receive a response within 2 business days.   Please advise at Mobile [phone] (mobile)

## 2024-02-12 NOTE — Telephone Encounter (Signed)
 Okay to change prescription as requested.  Thanks

## 2024-02-12 NOTE — Telephone Encounter (Signed)
 Patient wants 4 tablets a day so he can get more for his money  (takes one a day but price is the same regardless)- please advise.

## 2024-02-12 NOTE — Telephone Encounter (Signed)
 Sent new rx for Lialda  - 4 tablets a day

## 2024-03-04 ENCOUNTER — Ambulatory Visit (INDEPENDENT_AMBULATORY_CARE_PROVIDER_SITE_OTHER): Admitting: Family Medicine

## 2024-03-04 ENCOUNTER — Encounter: Payer: Self-pay | Admitting: Family Medicine

## 2024-03-04 VITALS — BP 113/70 | HR 64 | Temp 99.1°F | Ht 72.0 in | Wt 163.0 lb

## 2024-03-04 DIAGNOSIS — H6993 Unspecified Eustachian tube disorder, bilateral: Secondary | ICD-10-CM | POA: Diagnosis not present

## 2024-03-04 DIAGNOSIS — J3489 Other specified disorders of nose and nasal sinuses: Secondary | ICD-10-CM

## 2024-03-04 MED ORDER — FLUTICASONE PROPIONATE 50 MCG/ACT NA SUSP: 1.0000 | Freq: Every day | NASAL | 6 refills | Status: AC

## 2024-03-04 NOTE — Progress Notes (Signed)
 Subjective  CC:  Chief Complaint  Patient presents with   ear pressure    Ear pressure for the past 8mos and has been more intense over the past week    HPI: Shannon Hodge is a 78 y.o. male who presents to the office today to address the problems listed above in the chief complaint. Discussed the use of AI scribe software for clinical note transcription with the patient, who gave verbal consent to proceed.  History of Present Illness Shannon Hodge is a 78 year old male who presents with sinus and ear pressure issues.  He has been experiencing sinus and ear pressure for approximately eight months, describing it as a constant pressure similar to pressing a thumb against the soft palate. The pressure is bilateral, but the clicking sound is unilateral. He describes a dull, sleep-deprived headache that he attributes to lack of sleep. Taking a decongestant relieved the clicking sound in his ear. He has a history of using Flonase  nasal spray years ago but experienced rebound effects. Recently, he took a daytime cold medicine containing Sudafed, which helped alleviate the clicking.  He has sleep issues attributed to neuropathy and back problems. He describes a 'sleep deprived headache' linked to his lack of sleep. He uses melatonin 5 mg to help extend his sleep duration, which he finds beneficial. He reports sleeping seven hours straight through the previous night, which is an improvement for him.  No significant drainage down the back of his throat, congestion, or throat clearing in the morning. No history of being prone to allergies, inflammation, or watery eyes. He has not been on allergy medications like Zyrtec, Allegra, or Claritin, but has used Flonase  in the past.   Assessment  1. Sinus pressure   2. ETD (Eustachian tube dysfunction), bilateral      Plan  Assessment and Plan Assessment & Plan Eustachian tube dysfunction with ear and sinus pressure Chronic dysfunction with ear and  sinus pressure for eight months. Symptoms include clicking, pressure, and fullness without infection. Likely due to inflammation, possibly seasonal. Decongestants provide temporary relief, rebound noted with Flonase . - Prescribed Flonase  nasal spray for 2 to 6 weeks to reduce inflammation. - Instructed on proper Flonase  use: gentle spray, hold to absorb, avoid inhaling.    Follow up: prn No orders of the defined types were placed in this encounter.  Meds ordered this encounter  Medications   fluticasone  (FLONASE ) 50 MCG/ACT nasal spray    Sig: Place 1 spray into both nostrils daily.    Dispense:  16 g    Refill:  6     I reviewed the patients updated PMH, FH, and SocHx.  Patient Active Problem List   Diagnosis Date Noted   Ulcerative colitis (HCC) 10/26/2020    Priority: High   Essential hypertension 04/01/2020    Priority: High   Spinal stenosis of lumbar region 04/01/2020    Priority: High   Diabetic polyneuropathy associated with type 2 diabetes mellitus (HCC) 01/01/2019    Priority: High   Restless leg syndrome 09/21/2017    Priority: High   Mixed hyperlipidemia 09/21/2017    Priority: High   Controlled type 2 diabetes mellitus with diabetic polyneuropathy, without long-term current use of insulin (HCC) 09/01/2016    Priority: High   Insomnia 02/22/2016    Priority: High   Lumbar radiculopathy 11/04/2014    Priority: High   Hearing loss 09/21/2017    Priority: Medium    Muscle cramp, nocturnal 09/21/2017  Priority: Medium    Foot drop, 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   Ascorbic Acid (VITAMIN C PO) Take by mouth.   Blood Glucose Monitoring Suppl (ONE TOUCH  ULTRA 2) w/Device KIT USE AS DIRECTED   celecoxib (CELEBREX) 100 MG capsule Take 100 mg by mouth 2 (two) times daily.   cholecalciferol (VITAMIN D) 1000 units tablet Take 1,000 Units by mouth daily.   Coenzyme Q10 (COQ10) 100 MG CAPS Take 1 capsule by mouth daily.   COMIRNATY syringe    Cyanocobalamin  (VITAMIN B-12 PO) Take by mouth daily.   fenofibrate  micronized (LOFIBRA) 134 MG capsule TAKE 1 CAPSULE BY MOUTH EVERY DAY BEFORE BREAKFAST   Ferrous Sulfate (IRON PO) Take by mouth.   fluticasone  (FLONASE ) 50 MCG/ACT nasal spray Place 1 spray into both nostrils daily.   gabapentin  (NEURONTIN ) 600 MG tablet Take 1 tablet (600 mg total) by mouth 2 (two) times daily AND 2 tablets (1,200 mg total) at bedtime.   glucose blood (FREESTYLE LITE) test strip Use as instructed   Lancets (ONETOUCH ULTRASOFT) lancets Use as instructed   lisinopril  (ZESTRIL ) 20 MG tablet TAKE 1 TABLET BY MOUTH EVERY DAY   Magnesium Oxide 500 MG TABS Take by mouth.   mesalamine  (LIALDA ) 1.2 g EC tablet Take 4 tablets (4.8 g total) by mouth daily with breakfast.   metFORMIN  (GLUCOPHAGE -XR) 500 MG 24 hr tablet TAKE 4 TABLETS (2,000 MG TOTAL) BY MOUTH DAILY   simvastatin  (ZOCOR ) 40 MG tablet TAKE 1 TABLET BY MOUTH EVERYDAY AT BEDTIME   tiZANidine (ZANAFLEX) 2 MG tablet    valACYclovir  (VALTREX ) 500 MG tablet Take 1 tablet (500 mg total) by mouth daily as needed (outbreak).   Allergies: Patient has no known allergies. Family History: Patient family history includes Brain cancer in his father; Diabetes in his father, maternal grandfather, maternal grandmother, paternal grandfather, and paternal grandmother; Healthy in his daughter and son; Pancreatic cancer in his mother; Stroke in his father. Social History:  Patient  reports that he has never smoked. He has never used smokeless tobacco. He reports that he does not drink alcohol and does not use drugs.  Review of Systems: Constitutional: Negative for fever malaise or  anorexia Cardiovascular: negative for chest pain Respiratory: negative for SOB or persistent cough Gastrointestinal: negative for abdominal pain  Objective  Vitals: BP 113/70   Pulse 64   Temp 99.1 F (37.3 C)   Ht 6' (1.829 m)   Wt 163 lb (73.9 kg)   SpO2 99%   BMI 22.11 kg/m  General: no acute distress , A&Ox3 HEENT: PEERL, conjunctiva normal, neck is supple B TM w/ scarring, nl landmarks OP w/ pnd Commons side effects, risks, benefits, and alternatives for medications and treatment plan prescribed today were discussed, and the patient expressed understanding of the given instructions. Patient is instructed to call or message via MyChart if he/she has any questions or concerns regarding our treatment plan. No barriers to understanding were identified. We discussed Red Flag symptoms and signs in detail. Patient expressed understanding regarding what to do in case of urgent or emergency type symptoms.  Medication list  was reconciled, printed and provided to the patient in AVS. Patient instructions and summary information was reviewed with the patient as documented in the AVS. This note was prepared with assistance of Dragon voice recognition software. Occasional wrong-word or sound-a-like substitutions may have occurred due to the inherent limitations of voice recognition software

## 2024-03-05 ENCOUNTER — Encounter: Payer: Self-pay | Admitting: Family Medicine

## 2024-03-29 ENCOUNTER — Encounter: Payer: Self-pay | Admitting: Family Medicine

## 2024-04-11 ENCOUNTER — Ambulatory Visit (INDEPENDENT_AMBULATORY_CARE_PROVIDER_SITE_OTHER): Admitting: Family Medicine

## 2024-04-11 VITALS — BP 108/58 | HR 57 | Temp 97.9°F | Ht 72.0 in | Wt 165.2 lb

## 2024-04-11 DIAGNOSIS — M5416 Radiculopathy, lumbar region: Secondary | ICD-10-CM | POA: Diagnosis not present

## 2024-04-11 DIAGNOSIS — N390 Urinary tract infection, site not specified: Secondary | ICD-10-CM

## 2024-04-11 DIAGNOSIS — I10 Essential (primary) hypertension: Secondary | ICD-10-CM

## 2024-04-11 LAB — POCT URINALYSIS DIPSTICK
Bilirubin, UA: NEGATIVE
Blood, UA: NEGATIVE
Glucose, UA: NEGATIVE
Ketones, UA: NEGATIVE
Leukocytes, UA: NEGATIVE
Nitrite, UA: NEGATIVE
Protein, UA: NEGATIVE
Spec Grav, UA: 1.015 (ref 1.010–1.025)
Urobilinogen, UA: 0.2 U/dL
pH, UA: 6.5 (ref 5.0–8.0)

## 2024-04-11 MED ORDER — CEPHALEXIN 500 MG PO CAPS: 500.0000 mg | ORAL_CAPSULE | Freq: Three times a day (TID) | ORAL | 0 refills | Status: AC

## 2024-04-11 MED ORDER — COVID-19 MRNA VAC-TRIS(PFIZER) 30 MCG/0.3ML IM SUSY: 0.3000 mL | PREFILLED_SYRINGE | Freq: Once | INTRAMUSCULAR | 0 refills | Status: AC

## 2024-04-11 NOTE — Progress Notes (Signed)
   Dasani Crear is a 78 y.o. male who presents today for an office visit.  Assessment/Plan:  Urinary tract infection UA is negative however history is consistent with previous UTIs.  No signs of systemic infection.  Will empirically start Keflex  until we await culture results.  He did well with this with most recent UTI about 8 months ago.  We did discuss having him follow-up with urology however he will hold off on this for now.  He has seen him previously for hematuria with a reassuring workup.  Encouraged hydration.  We discussed reasons to return to care.  Follow-up as needed  Lumbar radiculopathy On gabapentin  600 mg twice daily and 1200 mg at bedtime per PCP.  He is interested in weaning down on this though he will discuss this with his PCP.  Essential hypertension Blood pressure at goal today on lisinopril  40 mg daily.  Preventative health care COVID vaccine prescription was given today.    Subjective:  HPI:  See assessment / plan for status of chronic conditions.    Discussed the use of AI scribe software for clinical note transcription with the patient, who gave verbal consent to proceed.  History of Present Illness Vidit Boissonneault is a 78 year old male with a history of UTIs who presents with symptoms of a urinary tract infection.  He woke up at 5 AM with stinging, highly odorous, and very yellow urine. The symptoms have moderately abated by about 60-70% since onset. He had a similar episode nine months ago which resolved quickly with antibiotics.  This was confirmed as UTI via urine culture. No fever or chills. He denies any history of prostate issues, including difficulty emptying his bladder. He reports a steady urine stream and no dribbling or difficulty initiating urination.  He is over 66 and plans to get his COVID vaccine in October. He plays pickleball and has experienced falls related to neuropathy.         Objective:  Physical Exam: BP (!) 108/58   Pulse  (!) 57   Temp 97.9 F (36.6 C) (Temporal)   Ht 6' (1.829 m)   Wt 165 lb 3.2 oz (74.9 kg)   SpO2 97%   BMI 22.41 kg/m   Gen: No acute distress, resting comfortably Neuro: Grossly normal, moves all extremities Psych: Normal affect and thought content      Annabella Elford M. Kennyth, MD 04/11/2024 1:54 PM

## 2024-04-11 NOTE — Patient Instructions (Signed)
 It was very nice to see you today!  VISIT SUMMARY: Today, you were seen for symptoms of a urinary tract infection (UTI). We discussed your recurrent UTIs, peripheral neuropathy, and plans for your COVID and flu vaccinations.  YOUR PLAN: URINARY TRACT INFECTION: You have a recurrent UTI with symptoms like stinging and odorous urine, which are improving. -A urine culture has been ordered to identify the cause of the infection. -You have been prescribed an antibiotic to treat the UTI. -If you experience more than two UTIs per year, we may need to refer you to a urologist.  PERIPHERAL NEUROPATHY: You have chronic neuropathy managed with gabapentin , though you are unsure of its effectiveness. -We discussed a tapering strategy for gabapentin  if you decide to stop it. You can reduce one pill every 3-5 days.  GENERAL HEALTH MAINTENANCE: You plan to get your COVID vaccine in October along with your flu vaccine. -A prescription for the COVID vaccine will be sent to your pharmacy for October.  Return if symptoms worsen or fail to improve.   Take care, Dr Kennyth  PLEASE NOTE:  If you had any lab tests, please let us  know if you have not heard back within a few days. You may see your results on mychart before we have a chance to review them but we will give you a call once they are reviewed by us .   If we ordered any referrals today, please let us  know if you have not heard from their office within the next week.   If you had any urgent prescriptions sent in today, please check with the pharmacy within an hour of our visit to make sure the prescription was transmitted appropriately.   Please try these tips to maintain a healthy lifestyle:  Eat at least 3 REAL meals and 1-2 snacks per day.  Aim for no more than 5 hours between eating.  If you eat breakfast, please do so within one hour of getting up.   Each meal should contain half fruits/vegetables, one quarter protein, and one quarter carbs (no  bigger than a computer mouse)  Cut down on sweet beverages. This includes juice, soda, and sweet tea.   Drink at least 1 glass of water with each meal and aim for at least 8 glasses per day  Exercise at least 150 minutes every week.

## 2024-04-12 LAB — URINE CULTURE
MICRO NUMBER:: 16955236
Result:: NO GROWTH
SPECIMEN QUALITY:: ADEQUATE

## 2024-04-15 ENCOUNTER — Ambulatory Visit: Payer: Self-pay | Admitting: Family Medicine

## 2024-04-15 NOTE — Progress Notes (Signed)
 Urine culture is negative for UTI.  It is okay for him to discontinue his antibiotics.  He should follow-up with his PCP if not improving.

## 2024-05-31 DIAGNOSIS — N401 Enlarged prostate with lower urinary tract symptoms: Secondary | ICD-10-CM | POA: Diagnosis not present

## 2024-05-31 DIAGNOSIS — N390 Urinary tract infection, site not specified: Secondary | ICD-10-CM | POA: Diagnosis not present

## 2024-05-31 DIAGNOSIS — R399 Unspecified symptoms and signs involving the genitourinary system: Secondary | ICD-10-CM | POA: Diagnosis not present

## 2024-06-06 ENCOUNTER — Ambulatory Visit (INDEPENDENT_AMBULATORY_CARE_PROVIDER_SITE_OTHER): Admitting: Family

## 2024-06-06 ENCOUNTER — Encounter: Payer: Self-pay | Admitting: Family

## 2024-06-06 ENCOUNTER — Ambulatory Visit: Payer: Self-pay

## 2024-06-06 VITALS — BP 104/50 | HR 55 | Temp 97.4°F | Ht 72.0 in | Wt 164.4 lb

## 2024-06-06 DIAGNOSIS — M542 Cervicalgia: Secondary | ICD-10-CM

## 2024-06-06 NOTE — Telephone Encounter (Signed)
 Reviewed

## 2024-06-06 NOTE — Telephone Encounter (Signed)
 FYI Only or Action Required?: FYI only for provider: appointment scheduled on today.  Patient was last seen in primary care on 04/11/2024 by Kennyth Worth HERO, MD.  Called Nurse Triage reporting Pain.  Symptoms began x 5 days and worsening.  Interventions attempted: OTC medications: Voltaren cream.  Symptoms are: gradually worsening.  Triage Disposition: See PCP When Office is Open (Within 3 Days)  Patient/caregiver understands and will follow disposition?: Yes   Copied from CRM [id number]. Topic: Clinical - Red Word Triage >> Jun 06, 2024  8:02 AM Victoria A wrote: [facility] that prompted transfer to Nurse Triage: Patient has neck pain that has been ongoing for 5 days. Reason for Disposition  [1] MODERATE neck pain (e.g., interferes with normal activities) AND [2] present > 3 days  Answer Assessment - Initial Assessment Questions 1. ONSET: When did the pain begin?      X 5 days ago 2. LOCATION: Where does it hurt?      Neck radiating to left shoulder 3. PATTERN Does the pain come and go, or has it been constant since it started?      constant 4. SEVERITY: How bad is the pain?  (Scale 0-10; or none or slight stiffness, mild, moderate, severe)     4 to 6/10 5. RADIATION: Does the pain go anywhere else, shoot into your arms?     radiating to left shoulder 6. CORD SYMPTOMS: Any weakness or numbness of the arms or legs?     no 7. CAUSE: What do you think is causing the neck pain?     Possible slept wrong 8. NECK OVERUSE: Any recent activities that involved turning or twisting the neck?     na 9. OTHER SYMPTOMS: Do you have any other symptoms? (e.g., headache, fever, chest pain, difficulty breathing, neck swelling)     Face/headache 10. PREGNANCY: Is there any chance you are pregnant? When was your last menstrual period?       na  Protocols used: Neck Pain or Stiffness-A-AH

## 2024-06-06 NOTE — Progress Notes (Signed)
 Patient ID: Princeton Nabor, male    DOB: 1946/07/03, 78 y.o.   MRN: [id number]  Chief Complaint  Patient presents with   Neck Pain    Pt c/o left sided neck pain and stiffness down to shoulder, present for a couple of days. Has tried Voltaren gel and hot/cold compresses, which did help slightly.   Discussed the use of AI scribe software for clinical note transcription with the patient, who gave verbal consent to proceed.  History of Present Illness Covey Baller is a 78 year old male who presents with left-sided neck pain.  He experiences long-standing left-sided neck pain with tenderness along the muscle radiating to the trapezius. There is no pain along the spine or swelling. The pain restricts his range of motion, though he can still move his neck to some extent. He uses Voltaren gel for pain relief and cannot take Aleve due to heart issues. He has not tried Tylenol arthritis and has tizanidine available but has not used it recently. His exercise routine is limited to treadmill exercises due to neuropathic issues. There is no numbness, tingling, or pins and needles sensation in the shoulder or neck. He has noticed slight improvement overnight and is concerned about the duration of the pain. He recently started on tamsulosin for prostate control due to urinary retention issues, which led to a UTI. He is under the care of a urologist for this condition.  Assessment & Plan Cervicalgia Acute cervicalgia with left-sided neck pain radiating to trapezius, likely due to muscle compression. No nerve compression signs. Conservative management preferred. - Continue Voltaren gel as needed, up to 4 times daily. - Apply heat for up to 30 minutes 3 times daily to affected area. - Perform neck exercises for alignment and muscle strengthening, reviewed and handout provided. - Consider Tylenol Arthritis ER (650 mg) up to three times daily for pain. - Use tizanidine 2mg  at night if needed to help with  sleep, caution for drowsiness. - Use firm pillow with neck support  for sleep. - Avoid holding head in forward flexion for long periods, discussed proper spine alignment. - Consider x-ray and orthopedic referral if no improvement in 2-3 weeks.  Subjective:    Outpatient Medications Prior to Visit  Medication Sig Dispense Refill   Alpha-Lipoic Acid 600 MG TABS Take by mouth.     Ascorbic Acid (VITAMIN C PO) Take by mouth.     Blood Glucose Monitoring Suppl (ONE TOUCH ULTRA 2) w/Device KIT USE AS DIRECTED 1 kit 0   celecoxib (CELEBREX) 100 MG capsule Take 100 mg by mouth 2 (two) times daily.     cholecalciferol (VITAMIN D) 1000 units tablet Take 1,000 Units by mouth daily.     Coenzyme Q10 (COQ10) 100 MG CAPS Take 1 capsule by mouth daily.     COMIRNATY syringe      Cyanocobalamin  (VITAMIN B-12 PO) Take by mouth daily.     fenofibrate  micronized (LOFIBRA) 134 MG capsule TAKE 1 CAPSULE BY MOUTH EVERY DAY BEFORE BREAKFAST 90 capsule 3   Ferrous Sulfate (IRON PO) Take by mouth.     fluticasone  (FLONASE ) 50 MCG/ACT nasal spray Place 1 spray into both nostrils daily. 16 g 6   gabapentin  (NEURONTIN ) 600 MG tablet Take 1 tablet (600 mg total) by mouth 2 (two) times daily AND 2 tablets (1,200 mg total) at bedtime. 180 tablet 5   glucose blood (FREESTYLE LITE) test strip Use as instructed 100 each 12   Lancets (ONETOUCH ULTRASOFT) lancets  Use as instructed 100 each 12   lisinopril  (ZESTRIL ) 20 MG tablet TAKE 1 TABLET BY MOUTH EVERY DAY 90 tablet 3   Magnesium Oxide 500 MG TABS Take by mouth.     mesalamine  (LIALDA ) 1.2 g EC tablet Take 4 tablets (4.8 g total) by mouth daily with breakfast. 360 tablet 3   metFORMIN  (GLUCOPHAGE -XR) 500 MG 24 hr tablet TAKE 4 TABLETS (2,000 MG TOTAL) BY MOUTH DAILY 360 tablet 3   simvastatin  (ZOCOR ) 40 MG tablet TAKE 1 TABLET BY MOUTH EVERYDAY AT BEDTIME 90 tablet 3   tiZANidine (ZANAFLEX) 2 MG tablet      valACYclovir  (VALTREX ) 500 MG tablet Take 1 tablet (500 mg  total) by mouth daily as needed (outbreak). 30 tablet 2   No facility-administered medications prior to visit.   Past Medical History:  Diagnosis Date   Basal cell carcinoma    upper back   Cataract    right eye   Diabetes (HCC)    type 2   Diabetic neuropathy (HCC)    both legs   DJD (degenerative joint disease)    L 5   Hypercholesteremia    Hyperlipidemia    Hypertension    Inflammatory bowel disease 10/26/2020   Unspecified. Seeing  GI   Neuropathy    Ruptured Achilles tendon due to trauma 10/2016   Ulcerative proctitis [facility])    Past Surgical History:  Procedure Laterality Date   basil cell     basil cell/mole removed from back   COLONOSCOPY WITH PROPOFOL  N/A 11/01/2016   Procedure: COLONOSCOPY WITH PROPOFOL ;  Surgeon: Gladis MARLA Louder, MD;  Location: [facility];  Service: Endoscopy;  Laterality: N/A;   colonscopy     x 2   cortisone injection  04/2021   lower back   ESOPHAGOGASTRODUODENOSCOPY (EGD) WITH PROPOFOL  N/A 11/01/2016   Procedure: ESOPHAGOGASTRODUODENOSCOPY (EGD) WITH PROPOFOL ;  Surgeon: Gladis MARLA Louder, MD;  Location: [facility];  Service: Endoscopy;  Laterality: N/A;   Peripheral Nerve Stimulation N/A 03/2023   TONSILLECTOMY     No Known Allergies    Objective:    Physical Exam Vitals and nursing note reviewed.  Constitutional:      General: He is not in acute distress.    Appearance: Normal appearance.  HENT:     Head: Normocephalic.  Cardiovascular:     Rate and Rhythm: Normal rate and regular rhythm.  Pulmonary:     Effort: Pulmonary effort is normal.     Breath sounds: Normal breath sounds.  Musculoskeletal:     Cervical back: No edema or rigidity. Pain with movement and muscular tenderness (left neck and top of trapezius) present. No spinous process tenderness. Decreased range of motion.  Skin:    General: Skin is warm and dry.  Neurological:     Mental Status: He is alert and oriented to person, place, and time.   Psychiatric:        Mood and Affect: Mood normal.    BP (!) 104/50 (BP Location: Left Arm, Patient Position: Sitting, Cuff Size: Normal)   Pulse (!) 55   Temp (!) 97.4 F (36.3 C) (Temporal)   Ht 6' (1.829 m)   Wt 164 lb 6.4 oz (74.6 kg)   SpO2 98%   BMI 22.30 kg/m  Wt Readings from Last 3 Encounters:  06/06/24 164 lb 6.4 oz (74.6 kg)  04/11/24 165 lb 3.2 oz (74.9 kg)  03/04/24 163 lb (73.9 kg)      Lucius Krabbe, NP

## 2024-06-26 ENCOUNTER — Encounter: Payer: Self-pay | Admitting: Internal Medicine

## 2024-06-26 NOTE — Telephone Encounter (Signed)
 Mr. Siwek 1.  Diarrhea can be a side effect of the drug 2.  I agree with empirically doubling your mesalamine  3.  I agree with fiber supplementation to bulk stools 4.  Can use an antidiarrheal such as Imodium if needed 5.  If you start developing bleeding, please let us  know. 6.  We can get you into see one of our advanced practitioners for follow-up in a couple weeks, if you wish. Happy Thanksgiving Dr. Abran

## 2024-07-09 LAB — OPHTHALMOLOGY REPORT-SCANNED

## 2024-08-07 ENCOUNTER — Ambulatory Visit: Admitting: Family Medicine

## 2024-08-07 ENCOUNTER — Other Ambulatory Visit: Payer: Self-pay | Admitting: Family Medicine

## 2024-08-07 ENCOUNTER — Ambulatory Visit: Payer: Self-pay | Admitting: Family Medicine

## 2024-08-07 ENCOUNTER — Encounter: Payer: Self-pay | Admitting: Family Medicine

## 2024-08-07 VITALS — BP 124/64 | HR 56 | Temp 97.7°F | Ht 72.0 in | Wt 167.0 lb

## 2024-08-07 DIAGNOSIS — E1142 Type 2 diabetes mellitus with diabetic polyneuropathy: Secondary | ICD-10-CM

## 2024-08-07 DIAGNOSIS — K5190 Ulcerative colitis, unspecified, without complications: Secondary | ICD-10-CM

## 2024-08-07 DIAGNOSIS — E782 Mixed hyperlipidemia: Secondary | ICD-10-CM

## 2024-08-07 DIAGNOSIS — I10 Essential (primary) hypertension: Secondary | ICD-10-CM | POA: Diagnosis not present

## 2024-08-07 DIAGNOSIS — N401 Enlarged prostate with lower urinary tract symptoms: Secondary | ICD-10-CM | POA: Diagnosis not present

## 2024-08-07 DIAGNOSIS — G2581 Restless legs syndrome: Secondary | ICD-10-CM

## 2024-08-07 DIAGNOSIS — E538 Deficiency of other specified B group vitamins: Secondary | ICD-10-CM | POA: Diagnosis not present

## 2024-08-07 LAB — LIPID PANEL
Cholesterol: 113 mg/dL (ref 28–200)
HDL: 60.8 mg/dL
LDL Cholesterol: 41 mg/dL (ref 10–99)
NonHDL: 52.54
Total CHOL/HDL Ratio: 2
Triglycerides: 56 mg/dL (ref 10.0–149.0)
VLDL: 11.2 mg/dL (ref 0.0–40.0)

## 2024-08-07 LAB — COMPREHENSIVE METABOLIC PANEL WITH GFR
ALT: 13 U/L (ref 3–53)
AST: 15 U/L (ref 5–37)
Albumin: 4.7 g/dL (ref 3.5–5.2)
Alkaline Phosphatase: 39 U/L (ref 39–117)
BUN: 26 mg/dL — ABNORMAL HIGH (ref 6–23)
CO2: 33 meq/L — ABNORMAL HIGH (ref 19–32)
Calcium: 9.6 mg/dL (ref 8.4–10.5)
Chloride: 104 meq/L (ref 96–112)
Creatinine, Ser: 1.16 mg/dL (ref 0.40–1.50)
GFR: 61.38 mL/min
Glucose, Bld: 104 mg/dL — ABNORMAL HIGH (ref 70–99)
Potassium: 4.2 meq/L (ref 3.5–5.1)
Sodium: 141 meq/L (ref 135–145)
Total Bilirubin: 0.8 mg/dL (ref 0.2–1.2)
Total Protein: 6.9 g/dL (ref 6.0–8.3)

## 2024-08-07 LAB — VITAMIN B12: Vitamin B-12: 432 pg/mL (ref 211–911)

## 2024-08-07 LAB — CBC WITH DIFFERENTIAL/PLATELET
Basophils Absolute: 0 K/uL (ref 0.0–0.1)
Basophils Relative: 0.9 % (ref 0.0–3.0)
Eosinophils Absolute: 0.3 K/uL (ref 0.0–0.7)
Eosinophils Relative: 8.6 % — ABNORMAL HIGH (ref 0.0–5.0)
HCT: 36.5 % — ABNORMAL LOW (ref 39.0–52.0)
Hemoglobin: 12.3 g/dL — ABNORMAL LOW (ref 13.0–17.0)
Lymphocytes Relative: 21.5 % (ref 12.0–46.0)
Lymphs Abs: 0.8 K/uL (ref 0.7–4.0)
MCHC: 33.6 g/dL (ref 30.0–36.0)
MCV: 95 fl (ref 78.0–100.0)
Monocytes Absolute: 0.3 K/uL (ref 0.1–1.0)
Monocytes Relative: 7.3 % (ref 3.0–12.0)
Neutro Abs: 2.4 K/uL (ref 1.4–7.7)
Neutrophils Relative %: 61.7 % (ref 43.0–77.0)
Platelets: 156 K/uL (ref 150.0–400.0)
RBC: 3.84 Mil/uL — ABNORMAL LOW (ref 4.22–5.81)
RDW: 13.2 % (ref 11.5–15.5)
WBC: 3.9 K/uL — ABNORMAL LOW (ref 4.0–10.5)

## 2024-08-07 LAB — HEMOGLOBIN A1C: Hgb A1c MFr Bld: 6.3 % (ref 4.6–6.5)

## 2024-08-07 LAB — MICROALBUMIN / CREATININE URINE RATIO
Creatinine,U: 58.2 mg/dL
Microalb Creat Ratio: UNDETERMINED mg/g (ref 0.0–30.0)
Microalb, Ur: <0.7 mg/dL

## 2024-08-07 LAB — TSH: TSH: 1.1 u[IU]/mL (ref 0.35–5.50)

## 2024-08-07 NOTE — Progress Notes (Signed)
 "  Subjective  CC:  Chief Complaint  Patient presents with   Hypertension   Diabetes   Hyperlipidemia    HPI: Shannon Hodge is a 79 y.o. male who presents to the office today for follow up of diabetes and problems listed above in the chief complaint.  Discussed the use of AI scribe software for clinical note transcription with the patient, who gave verbal consent to proceed.  History of Present Illness Shannon Hodge is a 79 year old male who presents for f/u chronic problems.   Lower urinary tract symptoms-reviewed recent primary care visit and urology visits for BPH with urinary obstruction - Urinary retention with post-void residual of approximately 300 cc - Enlarged prostate - Takes Tamsulosin for urinary symptoms however causing diarrhea and interfering with colitis symptoms.  Does not seem to be helpful.  Postvoid residual remains hide follow-up urology appointment. - No nocturia - Urinary stream is 'fine' - Recent urinary tract infection resolved quickly with antibiotics - No current urgency or symptoms of urinary tract infection - Scheduled to see urology in a couple of months for further evaluation.  He physically feels well. - Taking cranberry supplements as a preventative  Type 2 diabetes remains well-controlled by fasting sugars.  On high-dose metformin .  Overall tolerates well.  Eats well.  Exercises but feels lack stamina has decreased overall.  No chest pain or shortness of breath.  Chronic peripheral neuropathy treated with high-dose gabapentin  remains interfering with sleep.  Has seen neurology for follow-up.  Overdue for eye exam.  No history of retinopathy.  Hyperlipidemia with diabetes on statin.  Due for recheck.  Nonfasting today.  No side effects  Hypertension: Has been well-controlled. Feeling well. Taking medications w/o adverse effects. No symptoms of CHF, angina; no palpitations, sob, cp or lower extremity edema. Compliant with meds.   Restless leg  syndrome: Currently stable.  Takes iron B12 deficiency on oral supplements.  Due for recheck.  Ulcerative colitis remains mostly in remission.     Wt Readings from Last 3 Encounters:  08/07/24 167 lb (75.8 kg)  06/06/24 164 lb 6.4 oz (74.6 kg)  04/11/24 165 lb 3.2 oz (74.9 kg)    BP Readings from Last 3 Encounters:  08/07/24 124/64  06/06/24 (!) 104/50  04/11/24 (!) 108/58    Assessment  1. Controlled type 2 diabetes mellitus with diabetic polyneuropathy, without long-term current use of insulin (HCC)   2. Essential hypertension   3. Mixed hyperlipidemia   4. Restless leg syndrome   5. Vitamin B12 deficiency   6. Ulcerative colitis without complications, unspecified location (HCC)   7. 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 Symptoms include slow urinary stream and occasional UTIs. Current treatment with tamsulosin is causing gastrointestinal side effects, including loose stools and gas. Nocturia is not a significant issue. He is considering discontinuing tamsulosin due to side effects and lack of efficacy in reducing retention. - Continue follow-up with urologist in a couple of months. - Will consider alternative medications such as phosphodiesterase inhibitors to shrink the prostate. - Use cranberry juice and supplements to prevent UTIs. - Monitor for signs of UTI and treat with antibiotics if necessary.  Type 2 diabetes mellitus with diabetic polyneuropathy Diabetes is well-controlled with fasting glucose levels ranging from 90 to 105 mg/dL. Diabetic polyneuropathy is causing nocturnal pain, affecting sleep  quality. - Ordered blood work to assess current diabetes control and other metabolic parameters.  A1c today.  Annual function electrolytes. - Continue metformin  2000 daily. Continue diabetic diet  and exercise-   Essential hypertension This medical condition is well controlled. There are no signs of complications, medication side effects, or red flags. Patient is instructed to continue the current treatment plan without change in therapies or medications.. No symptoms of hypotension reported. - Ordered blood work to assess overall health and potential contributing factors to blood pressure.  Mixed hyperlipidemia Cholesterol levels need reassessment as it has been a couple of months since last evaluation. - Ordered blood work to assess cholesterol levels.  Lipids today thyroid  and LFTs.  Continue statin  Ulcerative colitis Current symptoms reported. Tamsulosin side effects may exacerbate gastrointestinal symptoms.  Restless legs, colitis both stable.  Recheck B12 levels  General health maintenance Discussion of holistic health approaches, including peptides for musculoskeletal health and muscle recovery. He is interested in exploring these options to maintain quality of life and physical fitness.    Follow up: 6 months for complete physical Orders Placed This Encounter  Procedures   CBC with Differential/Platelet   Comprehensive metabolic panel with GFR   Lipid panel   Hemoglobin A1c   TSH   Microalbumin / creatinine urine ratio   Vitamin B12   No orders of the defined types were placed in this encounter.     Immunization History  Administered Date(s) Administered   Fluad Quad(high Dose 65+) 03/30/2020, 04/29/2022   INFLUENZA, HIGH DOSE SEASONAL PF 05/09/2016, 05/04/2017, 04/12/2018, 03/30/2020, 04/09/2021, 04/29/2022, 04/14/2023   Influenza,inj,Quad PF,6+ Mos 04/05/2019   Influenza-Unspecified 05/01/2015   PFIZER Comirnaty(Gray Top)Covid-19 Tri-Sucrose Vaccine 10/28/2020, 04/29/2022   PFIZER(Purple Top)SARS-COV-2 Vaccination 08/22/2019, 09/12/2019, 03/30/2020   Pfizer Covid-19 Vaccine Bivalent Booster 42yrs & up 04/09/2021, 11/23/2021, 10/20/2022    Pfizer(Comirnaty)Fall Seasonal Vaccine 12 years and older 05/01/2024   Pneumococcal Conjugate-13 11/10/2014, 10/03/2016   Pneumococcal Polysaccharide-23 01/27/2011, 09/21/2017   Respiratory Syncytial Virus Vaccine,Recomb Aduvanted(Arexvy) 08/28/2022   Tdap 01/27/2011   Zoster Recombinant(Shingrix) 06/25/2018, 09/06/2018   Zoster, Live 01/19/2010    Diabetes Related Lab Review: Lab Results  Component Value Date   HGBA1C 6.5 11/29/2023   HGBA1C 6.2 (A) 05/31/2023   HGBA1C 6.3 11/03/2022    Lab Results  Component Value Date   MICROALBUR <0.7 11/29/2023   Lab Results  Component Value Date   CREATININE 1.21 11/29/2023   BUN 28 (H) 11/29/2023   NA 140 11/29/2023   K 4.4 11/29/2023   CL 101 11/29/2023   CO2 32 11/29/2023   Lab Results  Component Value Date   CHOL 145 11/29/2023   CHOL 134 11/03/2022   CHOL 110 10/29/2021   Lab Results  Component Value Date   HDL 65.90 11/29/2023   HDL 61.40 11/03/2022   HDL 43.00 10/29/2021   Lab Results  Component Value Date   LDLCALC 66 11/29/2023   LDLCALC 59 11/03/2022   LDLCALC 50 10/29/2021   Lab Results  Component Value Date   TRIG 68.0 11/29/2023   TRIG 67.0 11/03/2022   TRIG 84.0 10/29/2021   Lab Results  Component Value Date   CHOLHDL 2 11/29/2023   CHOLHDL 2 11/03/2022   CHOLHDL 3 10/29/2021   No results found for: LDLDIRECT The 10-year ASCVD risk score (Arnett DK, et al., 2019) is: 41.6%   Values used to calculate the score:     Age: 43 years     Clinically relevant sex: Male  Is Non-Hispanic African American: No     Diabetic: Yes     Tobacco smoker: No     Systolic Blood Pressure: 124 mmHg     Is BP treated: Yes     HDL Cholesterol: 65.9 mg/dL     Total Cholesterol: 145 mg/dL I have reviewed the PMH, Fam and Soc history. Patient Active Problem List   Diagnosis Date Noted Date Diagnosed   Ulcerative colitis (HCC) 10/26/2020     Priority: High    Unspecified. Seeing [facility]    Essential  hypertension 04/01/2020     Priority: High   Spinal stenosis of lumbar region 04/01/2020     Priority: High    Dr. Josefina    Diabetic polyneuropathy associated with type 2 diabetes mellitus (HCC) 01/01/2019     Priority: High    Severe, managed by neuro Meds tried: gabapentin , cymbalta , amitriptyline      Restless leg syndrome 09/21/2017     Priority: High   Mixed hyperlipidemia 09/21/2017     Priority: High   Controlled type 2 diabetes mellitus with diabetic polyneuropathy, without long-term current use of insulin (HCC) 09/01/2016     Priority: High   Insomnia 02/22/2016     Priority: High   Lumbar radiculopathy 11/04/2014     Priority: High   Hearing loss 09/21/2017     Priority: Medium     Wears hearing aides    Muscle cramp, nocturnal 09/21/2017     Priority: Medium    Foot drop, 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 He has never used smokeless tobacco. He reports that he does not drink alcohol and does not use drugs.  Review of Systems: Ophthalmic: negative for eye pain, loss of vision or double vision Cardiovascular: negative for chest pain Respiratory: negative for SOB or persistent cough Gastrointestinal: negative for abdominal pain Genitourinary: negative for dysuria or gross hematuria MSK: negative for foot lesions Neurologic: negative for weakness or gait disturbance  Objective  Vitals: BP 124/64   Pulse (!) 56   Temp 97.7 F (36.5 C)   Ht 6' (1.829 m)   Wt 167 lb (75.8 kg)   SpO2 94%   BMI 22.65 kg/m  General: well appearing, no acute distress  Psych:  Alert and oriented,  normal mood and affect HEENT:  Normocephalic, atraumatic, moist mucous membranes, supple neck  Cardiovascular:  Nl S1 and S2, RRR without murmur, gallop or rub. no edema Respiratory:  Good breath sounds bilaterally, CTAB with normal effort, no rales Foot exam: no erythema, pallor, or cyanosis visible   Diabetic education: ongoing education regarding chronic disease management for diabetes was given today. We continue to reinforce the ABC's of diabetic management: A1c (<7 or 8 dependent upon patient), tight blood pressure control, and cholesterol management with goal LDL < 100 minimally. We discuss diet strategies, exercise recommendations, medication options and possible side effects. At each visit, we review recommended immunizations and preventive care recommendations for diabetics and stress that good diabetic control can  prevent other problems. See below for this patient's data. Commons side effects, risks, benefits, and alternatives for medications and treatment plan prescribed today were discussed, and the patient expressed understanding of the given instructions. Patient is instructed to call or message via MyChart if he/she has any questions or concerns regarding our treatment plan. No barriers to understanding were identified. We discussed Red Flag symptoms and signs in detail. Patient expressed understanding regarding what to do in case of urgent or emergency type symptoms.  Medication list was reconciled, printed and provided to the patient in AVS. Patient instructions and summary information was reviewed with the patient as documented in the AVS. This note was prepared with assistance of Dragon voice recognition software. Occasional wrong-word or sound-a-like substitutions may have occurred due to the inherent limitations of voice recognition software   "

## 2024-08-07 NOTE — Patient Instructions (Signed)
 Please return in 6 months for your annual complete physical; please come fasting.   I will release your lab results to you on your MyChart account with further instructions. You may see the results before I do, but when I review them I will send you a message with my report or have my assistant call you if things need to be discussed. Please reply to my message with any questions. Thank you!   If you have any questions or concerns, please don't hesitate to send me a message via MyChart or call the office at [phone]. Thank you for visiting with us  today! It's our pleasure caring for you.

## 2024-08-07 NOTE — Progress Notes (Signed)
 See mychart note Dear Mr. Arguijo, Your results look good!  Sincerely, Dr. Jodie

## 2024-08-12 NOTE — Telephone Encounter (Signed)
 Please review and advise. Tks

## 2024-12-18 ENCOUNTER — Encounter: Admitting: Family Medicine

## 2025-01-21 ENCOUNTER — Ambulatory Visit
